# Patient Record
Sex: Male | Born: 1960
Health system: Southern US, Community
[De-identification: ages and names within clinical notes are randomized; demographics above are authoritative.]

## PROBLEM LIST (undated history)

## (undated) DIAGNOSIS — C61 Malignant neoplasm of prostate: Secondary | ICD-10-CM

## (undated) DIAGNOSIS — I10 Essential (primary) hypertension: Secondary | ICD-10-CM

## (undated) DIAGNOSIS — C7951 Secondary malignant neoplasm of bone: Secondary | ICD-10-CM

---

## 2010-06-27 DIAGNOSIS — C61 Malignant neoplasm of prostate: Secondary | ICD-10-CM

## 2010-06-27 HISTORY — DX: Malignant neoplasm of prostate: C61

## 2011-04-13 ENCOUNTER — Other Ambulatory Visit: Payer: Self-pay | Admitting: Urology

## 2011-04-13 ENCOUNTER — Other Ambulatory Visit (HOSPITAL_COMMUNITY): Payer: Self-pay | Admitting: Urology

## 2011-04-13 ENCOUNTER — Encounter (HOSPITAL_COMMUNITY): Payer: Managed Care, Other (non HMO)

## 2011-04-13 ENCOUNTER — Ambulatory Visit (HOSPITAL_COMMUNITY)
Admission: RE | Admit: 2011-04-13 | Discharge: 2011-04-13 | Disposition: A | Discharge status: Home / Self Care | Payer: Managed Care, Other (non HMO) | Source: Ambulatory Visit | Attending: Urology | Admitting: Urology

## 2011-04-13 DIAGNOSIS — Z01810 Encounter for preprocedural cardiovascular examination: Secondary | ICD-10-CM | POA: Insufficient documentation

## 2011-04-13 DIAGNOSIS — C61 Malignant neoplasm of prostate: Secondary | ICD-10-CM | POA: Insufficient documentation

## 2011-04-13 DIAGNOSIS — Z01812 Encounter for preprocedural laboratory examination: Secondary | ICD-10-CM | POA: Insufficient documentation

## 2011-04-13 DIAGNOSIS — Z01818 Encounter for other preprocedural examination: Secondary | ICD-10-CM

## 2011-04-13 LAB — CBC
HCT: 49 % (ref 39.0–52.0)
MCV: 84.6 fL (ref 78.0–100.0)
RBC: 5.79 MIL/uL (ref 4.22–5.81)
WBC: 8.8 10*3/uL (ref 4.0–10.5)

## 2011-04-13 LAB — BASIC METABOLIC PANEL
BUN: 14 mg/dL (ref 6–23)
CO2: 29 mEq/L (ref 19–32)
Chloride: 105 mEq/L (ref 96–112)
Creatinine, Ser: 1.17 mg/dL (ref 0.50–1.35)

## 2011-04-13 LAB — ABO/RH: ABO/RH(D): O NEG

## 2011-04-18 ENCOUNTER — Other Ambulatory Visit: Payer: Self-pay | Admitting: Urology

## 2011-04-18 ENCOUNTER — Inpatient Hospital Stay (HOSPITAL_COMMUNITY)
Admission: RE | Admit: 2011-04-18 | Discharge: 2011-04-19 | DRG: 708 | Disposition: A | Discharge status: Home / Self Care | Payer: Managed Care, Other (non HMO) | Source: Ambulatory Visit | Attending: Urology | Admitting: Urology

## 2011-04-18 DIAGNOSIS — G4733 Obstructive sleep apnea (adult) (pediatric): Secondary | ICD-10-CM | POA: Diagnosis present

## 2011-04-18 DIAGNOSIS — Z01812 Encounter for preprocedural laboratory examination: Secondary | ICD-10-CM

## 2011-04-18 DIAGNOSIS — C61 Malignant neoplasm of prostate: Principal | ICD-10-CM | POA: Diagnosis present

## 2011-04-18 DIAGNOSIS — I10 Essential (primary) hypertension: Secondary | ICD-10-CM | POA: Diagnosis present

## 2011-04-18 DIAGNOSIS — Z01810 Encounter for preprocedural cardiovascular examination: Secondary | ICD-10-CM

## 2011-04-18 DIAGNOSIS — Z01818 Encounter for other preprocedural examination: Secondary | ICD-10-CM

## 2011-04-18 LAB — BASIC METABOLIC PANEL
CO2: 22 mEq/L (ref 19–32)
Calcium: 8.6 mg/dL (ref 8.4–10.5)
Chloride: 102 mEq/L (ref 96–112)
Creatinine, Ser: 1.02 mg/dL (ref 0.50–1.35)
Glucose, Bld: 147 mg/dL — ABNORMAL HIGH (ref 70–99)

## 2011-04-18 LAB — TYPE AND SCREEN
ABO/RH(D): O NEG
Antibody Screen: NEGATIVE

## 2011-04-19 LAB — HEMOGLOBIN AND HEMATOCRIT, BLOOD: Hemoglobin: 13.6 g/dL (ref 13.0–17.0)

## 2011-04-21 NOTE — Op Note (Signed)
NAMEDONIEL, MAIELLO NO.:  [id number]  MEDICAL RECORD NO.:  [id number]  LOCATION:  1411                         FACILITY:  [facility]  PHYSICIAN:  Heloise Purpura, MD      DATE OF BIRTH:  1961/03/23  DATE OF PROCEDURE:  04/18/2011 DATE OF DISCHARGE:                              OPERATIVE REPORT   PREOPERATIVE DIAGNOSIS:  Clinically localized adenocarcinoma of the prostate (clinical stage T3a N0 M0).  POSTOPERATIVE DIAGNOSIS:  Clinically localized adenocarcinoma of the prostate (clinical stage T3a N0 M0).  PROCEDURE: 1. Robotic-assisted laparoscopic radical prostatectomy (non-nerve     sparing). 2. Bilateral robotic-assisted laparoscopic pelvic lymphadenectomy.  SURGEON:  Heloise Purpura, MD  ASSISTANT:  Natalia Leatherwood, MD  ANESTHESIA:  General.  COMPLICATIONS:  None.  ESTIMATED BLOOD LOSS:  250 cc.  INTRAVENOUS FLUIDS:  1200 cc of crystalloid.  SPECIMENS: 1. Prostate and seminal vesicles. 2. Right pelvic lymph nodes. 3. Left pelvic lymph nodes.  DISPOSITION OF SPECIMENS:  To Pathology.  DRAINS: 1. 20-French coude catheter. 2. #19 Blake pelvic drain.  INDICATION:  Mr. Frankson is a 50 year old gentleman who was found to have an abnormal digital rectal exam and elevated PSA.  He was diagnosed with locally advanced prostate cancer without evidence for distant metastasis.  After reviewing management options for treatment, he elected to proceed with primary surgical therapy understanding the high risk that he would require multimodality treatment.  The potential risks, complications, and alternative treatment options were discussed in detail and informed consent obtained.  DESCRIPTION OF PROCEDURE:  The patient was taken to the operating room and a general anesthetic was administered.  He was given preoperative antibiotics, placed in the dorsal lithotomy position, and prepped and draped in the usual sterile fashion.  Next, a preoperative time-out  was performed.  A Foley catheter was then inserted into the bladder and a site was selected just superior to the umbilicus for placement of the camera port.  This was placed using a standard open Hasson technique and his incision was made just superior to his known umbilical hernia.  A 12 mm port was placed and a pneumoperitoneum established.  With the 0- degree lens, the abdomen was inspected and there was no evidence of any intra-abdominal injuries or other abnormalities.  The remaining ports were then placed with 8 mm robotic ports placed in the left lower quadrant, far left lower quadrant, and right lower quadrant.  A 5 mm port was placed in the right upper quadrant and a 12 mm port was placed in the far right lateral abdominal wall.  All ports were placed under direct vision and without difficulty.  The surgical cart was then docked.  With the aid of the cautery scissors, the bladder was reflected posteriorly allowing entry into the space of Retzius and identification of the endopelvic fascia and prostate.  The endopelvic fascia was then incised from the apex back to the base of the prostate bilaterally and the underlying levator muscle fibers were swept laterally off the prostate, thereby isolating the dorsal vein.  The dorsal vein was then stapled and divided with a 45 mm Flex Echelon stapler.  The bladder neck was then identified  with the help of Foley catheter manipulation and was divided anteriorly, thereby exposing the catheter.  The catheter balloon was deflated and the catheter was brought into the operative field and used to retract the prostate anteriorly.  The posterior bladder neck was then divided and dissection proceeded between the bladder and prostate until the vasa deferentia and seminal vesicles were identified.  The vasa deferentia were then isolated, divided, and lifted anteriorly and the seminal vesicles were dissected down to their tips with care to control the  seminal vesicle arterial blood supply.  These structures were then also lifted anteriorly in the space between Denonvilliers fascia and the anterior rectum was developed with a combination of sharp and blunt dissection.  On the right side of the prostate, the prostate did appear to be densely adherent posteriorly, consistent with the patient's locally advanced disease.  This required extensive sharp dissection.  The vascular pedicles of the prostate were then ligated in a wide non-nerve sparing fashion.  Using Hem-o-lok clips.  Bipolar energy was also utilized to help control the vascular pedicles of the prostate.  Significant periprosthetic tissue was left on the prostate with the specimen.  The urethra was sharply transected and the prostate specimen was disarticulated.  The pelvis was then copiously irrigated and hemostasis was ensured.  With irrigation in the pelvis, air was injected into the rectal catheter, and there was no evidence for a rectal injury.  Attention then turned to the right pelvic sidewall.  The fibrofatty tissue extending from the genitofemoral nerve laterally to the confluence of the iliac vessels superiorly to Cooper ligament distally and to the hypogastric artery inferiorly and the bladder medially was dissected free from the pelvic sidewall with care to preserve all vital vascular structures and the obturator nerve.  Hem-o- lok clips were used for lymphostasis and hemostasis.  An identical procedure was performed on the contralateral side and both lymphatic packets were subsequently removed for permanent pathological analysis. Attention then turned to the urethral anastomosis.  A 2-0 Vicryl slipknot was placed between Denonvilliers fascia, the posterior bladder neck, and the posterior urethra to reapproximate these structures.  A double-armed 3-0 Monocryl suture was then used to perform a 360-degree running tension-free anastomosis between the bladder neck and  urethra. A new 20-French coude catheter was inserted into the bladder and irrigated.  There were no blood clots within the bladder and the anastomosis appeared to be watertight.  A #19 Blake drain was then brought through the left robotic port and positioned appropriately within the pelvis.  It was secured to the skin with a nylon suture.  The surgical cart was then undocked.  The right lateral 12 mm port site was then closed with a 0 Vicryl suture placed laparoscopically.  All remaining ports were then removed under direct vision.  The prostate specimen was removed intact within the Endopouch retrieval bag via the periumbilical port site.  This fascial opening was then carried down to the hernia defect in the umbilicus and the skin incision was slightly extended in a periumbilical fashion around the right side of the umbilicus.  The hernia defect was entered and the fascial edges of the hernia were then reapproximated utilizing figure-of-8 Novafil sutures as were the fascial edges of the original camera port incision.  This appeared to result in an excellent fascial closure of this defect.  All skin incisions were then injected with 0.25% Marcaine and reapproximated at the skin level with staples. Sterile dressings were applied.  The patient appeared  to tolerate the procedure well and without complications.  He was able to be extubated and transferred to the recovery unit in satisfactory condition.     Heloise Purpura, MD     LB/MEDQ  D:  04/18/2011  T:  04/18/2011  Job:  [id number]  Electronically Signed by Heloise Purpura MD on 04/21/2011 07:07:37 AM

## 2011-04-21 NOTE — Discharge Summary (Signed)
  NAMEBRISCOE, [name]:  [id number]  MEDICAL RECORD NO.:  [id number]  LOCATION:  1411                         FACILITY:  [facility]  PHYSICIAN:  Heloise Purpura, MD      DATE OF BIRTH:  Nov 12, 1960  DATE OF ADMISSION:  04/18/2011 DATE OF DISCHARGE:  04/19/2011                              DISCHARGE SUMMARY   REASON FOR ADMISSION:  Prostate cancer.  DISCHARGE DIAGNOSIS:  Prostate cancer.  HISTORY AND PHYSICAL:  For full details, please see admission history and physical.  Briefly, Mr. [name] is a 50 year old gentleman with locally advanced prostate cancer.  After discussing options for treatment, he elected to proceed with primary surgical therapy.  HOSPITAL COURSE:  On April 18, 2011, he underwent a robotic-assisted laparoscopic radical prostatectomy and extended pelvic lymphadenectomy. He tolerated this procedure well without complications. Postoperatively, he was able to be transferred to a regular hospital room following recovery from anesthesia.  He was able to begin ambulating in the night of surgery and remained hemodynamically stable. His hemoglobin on postoperative day #1 was 13.6.  He tolerated a clear liquid diet and was transitioned to oral pain medication.  He was able to ambulate without difficulty and maintained excellent urine output with minimal output from his pelvic drain, which was removed on postoperative day #1.  By the afternoon of postoperative day #1, he had met all discharge criteria.  DISPOSITION:  Home.  DISCHARGE MEDICATIONS:  He will resume all of his regular home medications excepting any aspirin, nonsteroidal anti-inflammatory drugs, or herbal supplements.  He was provided a prescription to take Vicodin as needed for pain and will use Colace as a stool softener.  He will begin ciprofloxacin 1 day prior to his return visit for Foley catheter removal.  DISCHARGE INSTRUCTIONS:  He was instructed to be ambulatory, but specifically  told to refrain from any heavy lifting, strenuous activity, or driving.  He was instructed on routine Foley catheter care.  FOLLOWUP:  He will follow up in 1 week for removal of this catheter and staples and to discuss his surgical pathology results in detail.     Heloise Purpura, MD    LB/MEDQ  D:  04/19/2011  T:  04/20/2011  Job:  [id number]  Electronically Signed by Heloise Purpura MD on 04/21/2011 07:08:03 AM

## 2012-08-05 ENCOUNTER — Emergency Department (HOSPITAL_COMMUNITY)
Admission: EM | Admit: 2012-08-05 | Discharge: 2012-08-05 | Disposition: A | Discharge status: Home / Self Care | Payer: Managed Care, Other (non HMO) | Attending: Emergency Medicine | Admitting: Emergency Medicine

## 2012-08-05 ENCOUNTER — Encounter (HOSPITAL_COMMUNITY): Payer: Self-pay | Admitting: Family Medicine

## 2012-08-05 DIAGNOSIS — Z8546 Personal history of malignant neoplasm of prostate: Secondary | ICD-10-CM | POA: Insufficient documentation

## 2012-08-05 DIAGNOSIS — I10 Essential (primary) hypertension: Secondary | ICD-10-CM | POA: Insufficient documentation

## 2012-08-05 DIAGNOSIS — M25539 Pain in unspecified wrist: Secondary | ICD-10-CM | POA: Insufficient documentation

## 2012-08-05 DIAGNOSIS — R209 Unspecified disturbances of skin sensation: Secondary | ICD-10-CM | POA: Insufficient documentation

## 2012-08-05 DIAGNOSIS — F411 Generalized anxiety disorder: Secondary | ICD-10-CM | POA: Insufficient documentation

## 2012-08-05 HISTORY — DX: Essential (primary) hypertension: I10

## 2012-08-05 LAB — POCT I-STAT, CHEM 8
BUN: 18 mg/dL (ref 6–23)
Calcium, Ion: 1.23 mmol/L (ref 1.12–1.23)
Chloride: 109 mEq/L (ref 96–112)
Glucose, Bld: 91 mg/dL (ref 70–99)
HCT: 43 % (ref 39.0–52.0)
Potassium: 4 mEq/L (ref 3.5–5.1)

## 2012-08-05 NOTE — ED Notes (Addendum)
Per EMS, pt was at work and had a sudden onset of sharp pain with numbness in his left arm from middle of bicep to forearm. sts lasted about 1 min. sts only anxious at the moment. BP was initially 190/120. Now 118/74. Denies chest pain, SOB, dizziness. Pt had 324 ASA and 1 nitro.

## 2012-08-05 NOTE — ED Provider Notes (Signed)
Medical screening examination/treatment/procedure(s) were performed by non-physician practitioner and as supervising physician I was immediately available for consultation/collaboration.  Francee Setzer, MD 08/05/12 1637 

## 2012-08-05 NOTE — ED Provider Notes (Signed)
History     CSN: [id number]  Arrival date & time 08/05/12  1255   First MD Initiated Contact with Patient 08/05/12 1317      Chief Complaint  Patient presents with  . Arm Pain  . Numbness    (Consider location/radiation/quality/duration/timing/severity/associated sxs/prior treatment) Patient is a 52 y.o. male presenting with arm pain.  Arm Pain   This patient is a 52 year old male with a history of prostate cancer and hypertension who presents today with arm pain and hand numbness.  The arm pain begin about a few hours ago when he was at work lifting a heavy bag of paint.  Then there was a sharp pain of the left anterior proximal 1/3 of the forearm.  Within one to two minutes the pain went away.  His co-worker told him that left arm pain was indicative of a stroke so the patient called the ambulance.  Once he got here he noticed that the tips of his finger and dorsal side of the hand on both sides were tingling.   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  He thinks that this may be caused by anxiety.  Denies dysarthria, dysphagia, aphasia, weakness on half of the body or diplopia.   Past Medical History  Diagnosis Date  . Hypertension   . Cancer     History reviewed. No pertinent past surgical history.  History reviewed. No pertinent family history.  History  Substance Use Topics  . Smoking status: Not on file  . Smokeless tobacco: Not on file  . Alcohol Use: Not on file      Review of Systems All other systems negative except as documented in the HPI. All pertinent positives and negatives as reviewed in the HPI.  Allergies  Review of patient's allergies indicates not on file.  Home Medications  No current outpatient prescriptions on file.  BP 122/79  Pulse 75  Temp(Src) 98.4 F (36.9 C) (Oral)  Resp 18  SpO2 95%  Physical Exam  Nursing note and vitals reviewed. Constitutional: He is oriented to person, place, and  time. He appears well-developed and well-nourished.  HENT:  Head: Normocephalic and atraumatic.  Mouth/Throat: Oropharynx is clear and moist.  Eyes: EOM are normal. Pupils are equal, round, and reactive to light.  Neck: Normal range of motion. Neck supple.  Cardiovascular: Normal rate, regular rhythm and normal heart sounds.  Exam reveals no gallop and no friction rub.   No murmur heard. Pulmonary/Chest: Effort normal and breath sounds normal. No respiratory distress.  Musculoskeletal: Normal range of motion.  Neurological: He is alert and oriented to person, place, and time. He has normal strength. No cranial nerve deficit or sensory deficit.  Skin: Skin is warm and dry. No rash noted.  Psychiatric: He has a normal mood and affect.    ED Course  Procedures (including critical care time)  The patient had forearm pain when lifting a heavy object and for about a minute after that time. The patient will be asked to recheck with his PCP. Told to return here as needed. This pain was most likely from straining with a heavy object.      MDM    Date: 08/05/2012  Rate: 75  Rhythm: normal sinus rhythm  QRS Axis: normal  Intervals: normal  ST/T Wave abnormalities: normal  Conduction Disutrbances:none  Narrative Interpretation:   Old EKG Reviewed: none available  Carlyle Dolly, PA-C 08/05/12 1537  Carlyle Dolly, PA-C 08/05/12 1537

## 2012-09-04 ENCOUNTER — Other Ambulatory Visit: Payer: Self-pay | Admitting: Emergency Medicine

## 2012-09-04 ENCOUNTER — Ambulatory Visit (INDEPENDENT_AMBULATORY_CARE_PROVIDER_SITE_OTHER): Payer: Managed Care, Other (non HMO)

## 2012-09-04 DIAGNOSIS — Z021 Encounter for pre-employment examination: Secondary | ICD-10-CM

## 2012-09-04 DIAGNOSIS — Z0289 Encounter for other administrative examinations: Secondary | ICD-10-CM

## 2012-12-27 ENCOUNTER — Ambulatory Visit: Payer: Managed Care, Other (non HMO) | Admitting: Internal Medicine

## 2013-02-21 ENCOUNTER — Other Ambulatory Visit (HOSPITAL_COMMUNITY): Payer: Self-pay | Admitting: Urology

## 2013-02-21 DIAGNOSIS — C61 Malignant neoplasm of prostate: Secondary | ICD-10-CM

## 2013-03-27 ENCOUNTER — Encounter (HOSPITAL_COMMUNITY): Payer: Self-pay

## 2013-03-27 ENCOUNTER — Encounter (HOSPITAL_COMMUNITY)
Admission: RE | Admit: 2013-03-27 | Discharge: 2013-03-27 | Disposition: A | Discharge status: Home / Self Care | Payer: Managed Care, Other (non HMO) | Source: Ambulatory Visit | Attending: Urology | Admitting: Urology

## 2013-03-27 DIAGNOSIS — C61 Malignant neoplasm of prostate: Secondary | ICD-10-CM | POA: Insufficient documentation

## 2013-03-27 MED ORDER — TECHNETIUM TC 99M MEDRONATE IV KIT
25.0000 | PACK | Freq: Once | INTRAVENOUS | Status: AC | PRN
  Administered 2013-03-27: 25 via INTRAVENOUS

## 2015-05-01 ENCOUNTER — Other Ambulatory Visit (HOSPITAL_COMMUNITY): Payer: Self-pay | Admitting: Urology

## 2015-05-01 DIAGNOSIS — C61 Malignant neoplasm of prostate: Secondary | ICD-10-CM

## 2015-05-15 ENCOUNTER — Encounter (HOSPITAL_COMMUNITY)
Admission: RE | Admit: 2015-05-15 | Discharge: 2015-05-15 | Disposition: A | Discharge status: Home / Self Care | Payer: Managed Care, Other (non HMO) | Source: Ambulatory Visit | Attending: Urology | Admitting: Urology

## 2015-05-15 DIAGNOSIS — C61 Malignant neoplasm of prostate: Secondary | ICD-10-CM

## 2015-05-15 MED ORDER — TECHNETIUM TC 99M MEDRONATE IV KIT: 24.2000 | PACK | Freq: Once | INTRAVENOUS | Status: DC | PRN

## 2015-05-20 ENCOUNTER — Telehealth: Payer: Self-pay | Admitting: Oncology

## 2015-05-20 NOTE — Telephone Encounter (Signed)
Spoke with pt's wife regarding new pt referral and wife confirmed appt.

## 2015-06-02 ENCOUNTER — Ambulatory Visit (HOSPITAL_BASED_OUTPATIENT_CLINIC_OR_DEPARTMENT_OTHER): Payer: Managed Care, Other (non HMO) | Admitting: Oncology

## 2015-06-02 ENCOUNTER — Telehealth: Payer: Self-pay | Admitting: Oncology

## 2015-06-02 ENCOUNTER — Encounter: Payer: Self-pay | Admitting: Oncology

## 2015-06-02 VITALS — BP 114/71 | HR 66 | Temp 98.0°F | Resp 20 | Ht 70.0 in | Wt 238.6 lb

## 2015-06-02 DIAGNOSIS — E291 Testicular hypofunction: Secondary | ICD-10-CM

## 2015-06-02 DIAGNOSIS — C7951 Secondary malignant neoplasm of bone: Secondary | ICD-10-CM | POA: Diagnosis not present

## 2015-06-02 DIAGNOSIS — C61 Malignant neoplasm of prostate: Secondary | ICD-10-CM | POA: Diagnosis not present

## 2015-06-02 MED ORDER — ENZALUTAMIDE 40 MG PO CAPS: 160.0000 mg | ORAL_CAPSULE | Freq: Every day | ORAL | Status: DC

## 2015-06-02 NOTE — Progress Notes (Signed)
Please see consult note.  

## 2015-06-02 NOTE — Telephone Encounter (Signed)
per pof to sch pt appt-gave pt copy of avs °

## 2015-06-02 NOTE — Consult Note (Signed)
Reason for Referral: Prostate cancer.   HPI: 54 year old gentleman currently of [place] where he lived the majority of his life. He is a rather healthy gentleman with history of obesity and hypertension and was diagnosed with prostate cancer in 2012. At that time he was complaining of erectile dysfunction and his PSA was noted to be 16.6. He underwent a radical prostatectomy on 04/18/2011 under the care of Dr. Alinda Money. The final pathology showed to have prostate adenocarcinoma with a Gleason score of 4+5 = 9 with tumor involving both lobes. Extraprostatic extension was noted with bilateral regional lymph node were involved with malignancy. He had 5 of 6 lymph nodes noted in the right regional lymph node basin. He also had 4 out of 5 lymph nodes noted on the left. The final pathological staging was T3b N1 with 9 positive lymph nodes out of 11 examined. He continued to have persistent PSA and was treated with androgen deprivation. His PSA became undetectable until August 2014. He had a rise in his PSA was up to 1.01 in August 2014. The rise continued to be rather slow up to 1.93 in October 14, 1.55 in May 2015. In September 2015 was 1.29 and Casodex was added around that time. In June 2016 was 2.0 and most recently his PSA was up to 5.6. Staging workup including a bone scan done on 05/15/2015 which showed a focus of increased uptake in the posterior parietal occipital region of the skull. There is also area of uptake likely degenerative in nature. CT scan of the abdomen and pelvis done on 05/15/2015 shows sclerotic osseous metastasis noted in multiple locations. These include the spine, pelvis, hips and sternum as well as the ribs. Based on these findings as asked to comment about further therapy. Clinically, he is asymptomatic at this time. He continued to work full time without any major decline. He does report intermittent back pain that have been chronic and arthritic in nature. Has not reported  any genitourinary complaints of frequency urgency or hematuria. His performance status activity level remain excellent.  He does not report any headaches, blurry vision, syncope or seizures. Does not report any fevers, chills, sweats or weight loss. Does not report any chest pain, palpitation, orthopnea or leg edema. Does not report any cough, hemoptysis or wheezing. Does not report any nausea, vomiting, abdominal pain. Does not report any hematochezia, melena or early satiety. Does not report any frequency urgency or hesitancy. Does not report any skeletal complaints. Remaining review of systems unremarkable.   Past Medical History  Diagnosis Date  . Hypertension   . Cancer   :  No past surgical history on file.:   Current outpatient prescriptions:  .  amLODipine (NORVASC) 10 MG tablet, Take 10 mg by mouth daily., Disp: , Rfl:  .  amLODipine-olmesartan (AZOR) 5-40 MG per tablet, Take 1 tablet by mouth daily., Disp: , Rfl:  .  calcium & magnesium carbonates (MYLANTA) 311-232 MG tablet, Take 1,200 tablets by mouth daily., Disp: , Rfl:  .  calcium-vitamin D (CALCIUM 500+D) 500-200 MG-UNIT per tablet, Take 2 tablets by mouth daily., Disp: , Rfl:  .  naproxen sodium (ANAPROX) 220 MG tablet, Take 440 mg by mouth 2 (two) times daily as needed. For pain, Disp: , Rfl:  .  enzalutamide (XTANDI) 40 MG capsule, Take 4 capsules (160 mg total) by mouth daily., Disp: 120 capsule, Rfl: 0:  Allergies  Allergen Reactions  . Lanolin   :  No family history on file.:  Social History   Social History  . Marital Status: Married    Spouse Name: N/A  . Number of Children: N/A  . Years of Education: N/A   Occupational History  . Not on file.   Social History Main Topics  . Smoking status: Not on file  . Smokeless tobacco: Not on file  . Alcohol Use: Not on file  . Drug Use: Not on file  . Sexual Activity: Not on file   Other Topics Concern  . Not on file   Social History Narrative  . No  narrative on file  :  Pertinent items are noted in HPI.  Exam: ECOG 0 Blood pressure 114/71, pulse 66, temperature 98 F (36.7 C), temperature source Oral, resp. rate 20, height 5\' 10"  (1.778 m), weight 238 lb 9.6 oz (108.228 kg), SpO2 100 %. General appearance: alert and cooperative. No active distress. Head: Normocephalic, without obvious abnormality. Throat: lips, mucosa, and tongue normal; teeth and gums normal Neck: no adenopathy Back: negative Resp: clear to auscultation bilaterally Chest wall: no tenderness Cardio: regular rate and rhythm, S1, S2 normal, no murmur, click, rub or gallop GI: soft, non-tender; bowel sounds normal; no masses,  no organomegaly Extremities: extremities normal, atraumatic, no cyanosis or edema Pulses: 2+ and symmetric Skin: Skin color, texture, turgor normal. No rashes or lesions Lymph nodes: Cervical, supraclavicular, and axillary nodes normal.  CBC    Component Value Date/Time   WBC 8.8 04/13/2011 0945   RBC 5.79 04/13/2011 0945   HGB 14.6 08/05/2012 1524   HCT 43.0 08/05/2012 1524   PLT 269 04/13/2011 0945   MCV 84.6 04/13/2011 0945   MCH 28.7 04/13/2011 0945   MCHC 33.9 04/13/2011 0945   RDW 13.8 04/13/2011 0945      Chemistry      Component Value Date/Time   NA 142 08/05/2012 1524   K 4.0 08/05/2012 1524   CL 109 08/05/2012 1524   CO2 22 04/18/2011 1520   BUN 18 08/05/2012 1524   CREATININE 1.20 08/05/2012 1524      Component Value Date/Time   CALCIUM 8.6 04/18/2011 1520         Nm Bone Scan Whole Body  05/15/2015  CLINICAL DATA:  Prostate malignancy, no current symptoms EXAM: NUCLEAR MEDICINE WHOLE BODY BONE SCAN TECHNIQUE: Whole body anterior and posterior images were obtained approximately 3 hours after intravenous injection of radiopharmaceutical. RADIOPHARMACEUTICALS:  24.2 mCi Technetium-43m MDP IV COMPARISON:  Nuclear bone scan of March 27, 2013 FINDINGS: There is adequate uptake of the radiopharmaceutical by the  skeleton there is adequate soft tissue clearance and renal activity. There is a new focus of increased uptake in the posterior parietal bone in the midline. There is minimal uptake in the costo vertebral junctions bilaterally compatible with degenerative change. There is a new focus of mildly increased uptake in the posterior lateral aspect of the left eighth rib and in the anterior aspect of the right fifth rib. There is a focus of mildly increased uptake within the body of approximately L3 that is likely degenerative. Upper extremities: There is mildly increased up to take within the sternoclavicular joints and manubrium which is slightly more conspicuous than on previous studies. Given its symmetry this is likely due to degenerative change. There is mild stable increased uptake about both shoulders. There is a focus of increased uptake in the midshaft of the right humerus. This relatively low level and there may been some patient motion which can pounds the findings in other  views. Pelvis and lower extremities: Uptake within the pelvis is unremarkable. There is mildly increased uptake about the right knee, proximal tibia, and in both feet compatible with degenerative change. IMPRESSION: 1. Focus of new increased uptake in the posterior parietal-occipital region in the midline. A skull series is recommended. 2. Areas of increased uptake elsewhere as described are most likely degenerative or post traumatic. A bilateral rib series would be useful to evaluate the anterior right fifth rib as well as the posterior lateral left eighth rib. A lumbar spine AP and lateral series is recommended to evaluate L3. Electronically Signed   By: David  Martinique M.D.   On: 05/15/2015 14:34    Assessment and Plan:   54 year old gentleman with the following issues:  1. 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 His initial diagnosis was in 2012 where he had a Gleason  score of 4+5 = 9 and a PSA of 16.6. He underwent a robotic prostatectomy and androgen deprivation. After a PSA nadir he had been on a slow rise for the last 2 years. His most recent PSA was 5.6 in November 2016 with possible bony metastasis detected on his most recent imaging studies.  The natural course of this disease was discussed with the patient and his wife. He understands any therapy moving forward would be palliative rather than curative. His options would include second line hormone therapy with Nicki Reaper, ketoconazole and prednisone among other hormonal agents. Immunotherapy such as Provenge would be reasonable as well. Systemic chemotherapy as well as enrollment in clinical trials are also options.  The rationale and the risks and benefits of all these modalities were reviewed today and he would be reasonable to consider second line hormone therapy given the slow rise in his PSA but the aggressive nature of his malignancy. His quality of life will be changed dramatically with systemic chemotherapy and I will be deferred to a later date.  The risks and benefits of Xtandi were reviewed today. Complications include fatigue, tiredness, lower from edema, hypertension and rarely seizures have reviewed and he is agreeable to proceed.  The cost associated with this medication was also reviewed and will make sure that no financial toxicity will be incurred by prescribing this medication.  The benefit from this medication would include a reduction in his PSA as well decrease in his potential bony metastasis.  2. Hormonal therapy: I have recommended androgen deprivation to be continued indefinitely.  3. Bone directed therapy: It will be reasonable to consider Xgeva in the future if his clear-cut bone metastasis have been elucidated.  4. Prognosis: He understands the goal of therapy is palliative but his prognosis continues to be reasonable at this time given his young age, excellent performance  status is reasonable to think that his overall survival should exceed 24 months.  5. Follow-up: Will be in one month to assess his response to this therapy.

## 2015-06-02 NOTE — Progress Notes (Signed)
Sent pa form for xtandi to [facility]; they have up to 72-120 hours to make a decision.

## 2015-06-02 NOTE — Progress Notes (Signed)
Script for xtandi faxed to [facility]

## 2015-06-09 ENCOUNTER — Encounter: Payer: Self-pay | Admitting: Pharmacist

## 2015-06-09 DIAGNOSIS — Z5111 Encounter for antineoplastic chemotherapy: Secondary | ICD-10-CM

## 2015-06-09 NOTE — Progress Notes (Signed)
Oral Chemotherapy Pharmacist Encounter   I spoke with patient for overview of new oral chemotherapy medication: Xtandi. Pt is doing well. The prescription has been sent to the [facility] for benefit analysis and approval. Copay is $20. Patient will pick up tomorrow 12/14  Counseled patient on administration, dosing, side effects, safe handling, and monitoring. Side effects include but not limited to: Fatigue, back and muscle pain, hypertension, dizziness, and swelling.  Mr. Decou voiced understanding and appreciation.   All questions answered.  Will follow up in 2 weeks for adherence and toxicity management.   Thank you,  Montel Clock, PharmD, [facility]

## 2015-06-16 ENCOUNTER — Telehealth: Payer: Self-pay | Admitting: Oncology

## 2015-06-16 NOTE — Telephone Encounter (Signed)
Due to call moved 1/4 appointments to 1/6. Left message for patient and mailed schedule.

## 2015-07-01 ENCOUNTER — Other Ambulatory Visit: Payer: Managed Care, Other (non HMO)

## 2015-07-01 ENCOUNTER — Ambulatory Visit: Payer: Managed Care, Other (non HMO) | Admitting: Oncology

## 2015-07-03 ENCOUNTER — Telehealth: Payer: Self-pay | Admitting: Oncology

## 2015-07-03 ENCOUNTER — Other Ambulatory Visit (HOSPITAL_BASED_OUTPATIENT_CLINIC_OR_DEPARTMENT_OTHER): Payer: Managed Care, Other (non HMO)

## 2015-07-03 ENCOUNTER — Ambulatory Visit (HOSPITAL_BASED_OUTPATIENT_CLINIC_OR_DEPARTMENT_OTHER): Payer: Managed Care, Other (non HMO) | Admitting: Oncology

## 2015-07-03 VITALS — BP 130/71 | HR 78 | Temp 98.2°F | Resp 18 | Ht 70.0 in | Wt 241.9 lb

## 2015-07-03 DIAGNOSIS — C61 Malignant neoplasm of prostate: Secondary | ICD-10-CM | POA: Diagnosis not present

## 2015-07-03 DIAGNOSIS — C775 Secondary and unspecified malignant neoplasm of intrapelvic lymph nodes: Secondary | ICD-10-CM | POA: Diagnosis not present

## 2015-07-03 DIAGNOSIS — E291 Testicular hypofunction: Secondary | ICD-10-CM

## 2015-07-03 DIAGNOSIS — C7951 Secondary malignant neoplasm of bone: Secondary | ICD-10-CM

## 2015-07-03 LAB — CBC WITH DIFFERENTIAL/PLATELET
BASO%: 0.3 % (ref 0.0–2.0)
BASOS ABS: 0 10*3/uL (ref 0.0–0.1)
EOS%: 1.4 % (ref 0.0–7.0)
Eosinophils Absolute: 0.1 10*3/uL (ref 0.0–0.5)
HEMATOCRIT: 38.8 % (ref 38.4–49.9)
HGB: 12.7 g/dL — ABNORMAL LOW (ref 13.0–17.1)
LYMPH%: 12 % — AB (ref 14.0–49.0)
MCH: 25.6 pg — AB (ref 27.2–33.4)
MCHC: 32.7 g/dL (ref 32.0–36.0)
MCV: 78.2 fL — ABNORMAL LOW (ref 79.3–98.0)
MONO#: 0.7 10*3/uL (ref 0.1–0.9)
MONO%: 7 % (ref 0.0–14.0)
NEUT#: 8.1 10*3/uL — ABNORMAL HIGH (ref 1.5–6.5)
NEUT%: 79.3 % — AB (ref 39.0–75.0)
Platelets: 338 10*3/uL (ref 140–400)
RBC: 4.96 10*6/uL (ref 4.20–5.82)
RDW: 15.7 % — ABNORMAL HIGH (ref 11.0–14.6)
WBC: 10.2 10*3/uL (ref 4.0–10.3)
lymph#: 1.2 10*3/uL (ref 0.9–3.3)

## 2015-07-03 LAB — COMPREHENSIVE METABOLIC PANEL
ALT: 24 U/L (ref 0–55)
AST: 17 U/L (ref 5–34)
Albumin: 3.6 g/dL (ref 3.5–5.0)
Alkaline Phosphatase: 103 U/L (ref 40–150)
Anion Gap: 10 mEq/L (ref 3–11)
BUN: 22.5 mg/dL (ref 7.0–26.0)
CALCIUM: 9.5 mg/dL (ref 8.4–10.4)
CHLORIDE: 103 meq/L (ref 98–109)
CO2: 24 meq/L (ref 22–29)
Creatinine: 1.5 mg/dL — ABNORMAL HIGH (ref 0.7–1.3)
EGFR: 53 mL/min/{1.73_m2} — ABNORMAL LOW (ref >90)
Glucose: 112 mg/dl (ref 70–140)
POTASSIUM: 3.9 meq/L (ref 3.5–5.1)
Sodium: 137 mEq/L (ref 136–145)
Total Bilirubin: 0.84 mg/dL (ref 0.20–1.20)
Total Protein: 7.7 g/dL (ref 6.4–8.3)

## 2015-07-03 NOTE — Telephone Encounter (Signed)
Gave patient avs report and appointments for February.  °

## 2015-07-03 NOTE — Progress Notes (Signed)
Hematology and Oncology Follow Up Visit  [name] [id number] 20-Apr-1961 55 y.o. 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 His initial diagnosis was in 2012 where he had a Gleason score of 4+5 = 9 and a PSA of 16.6.   Prior Therapy:  He underwent a radical prostatectomy on 04/18/2011 under the care of Dr. Alinda Money. The final pathology showed to have prostate adenocarcinoma with a Gleason score of 4+5 = 9 with tumor involving both lobes. Extraprostatic extension was noted with bilateral regional lymph node were involved with malignancy. He had 5 of 6 lymph nodes noted in the right regional lymph node basin. He also had 4 out of 5 lymph nodes noted on the left. The final pathological staging was T3b N1 with 9 positive lymph nodes out of 11 examined.  He continued to have persistent PSA and was treated with androgen deprivation. His PSA became undetectable until August 2014. In September 2015 PSA increased up to 1.29 and Casodex was added around that time.  In June 2016 PSA went up to 2.0 and subsequently to 5.6 in November 2016. Staging workup including a bone scan done on 05/15/2015 which showed a focus of increased uptake in the posterior parietal occipital region of the skull. There is also area of uptake likely degenerative in nature. CT scan of the abdomen and pelvis done on 05/15/2015 shows sclerotic osseous metastasis noted in multiple locations.  Current therapy:  Androgen deprivation under the care of Dr. Alinda Money. Xtandi started on 06/10/2015.  Interim History: [name] presents today for a follow-up visit. He is a pleasant gentleman with the above history I saw in consultation on 06/02/2015. Since the last visit, he started xtandi and have tolerated it well. He has not reported any complications related to it. He denied any abdominal pain, nausea or less of appetite. He has not  reported any neurological issues or seizures. He continues to work full-time without any decline in his performance status her energy level.   He does not report any headaches, blurry vision, syncope or seizures. Does not report any fevers, chills, sweats or weight loss. Does not report any chest pain, palpitation, orthopnea or leg edema. Does not report any cough, hemoptysis or wheezing. Does not report any nausea, vomiting, abdominal pain. Does not report any hematochezia, melena or early satiety. Does not report any frequency urgency or hesitancy. Does not report any skeletal complaints. Remaining review of systems unremarkable.  Medications: I have reviewed the patient's current medications.  Current Outpatient Prescriptions  Medication Sig Dispense Refill  . amLODipine (NORVASC) 10 MG tablet Take 10 mg by mouth daily.    Marland Kitchen amLODipine-olmesartan (AZOR) 5-40 MG per tablet Take 1 tablet by mouth daily.    . calcium & magnesium carbonates (MYLANTA) [id number] MG tablet Take 1,200 tablets by mouth daily.    . calcium-vitamin D (CALCIUM 500+D) 500-200 MG-UNIT per tablet Take 2 tablets by mouth daily.    . enzalutamide (XTANDI) 40 MG capsule Take 4 capsules (160 mg total) by mouth daily. 120 capsule 0  . naproxen sodium (ANAPROX) 220 MG tablet Take 440 mg by mouth 2 (two) times daily as needed. For pain     No current facility-administered medications for this visit.     Allergies:  Allergies  Allergen Reactions  . Lanolin     Past Medical History, Surgical history, Social history, and Family History were reviewed and  updated.   Physical Exam: Blood pressure 130/71, pulse 78, temperature 98.2 F (36.8 C), temperature source Oral, resp. rate 18, height 5\' 10"  (1.778 m), weight 241 lb 14.4 oz (109.725 kg), SpO2 100 %. ECOG: 0 General appearance: alert and cooperative Head: Normocephalic, without obvious abnormality Neck: no adenopathy Lymph nodes: Cervical, supraclavicular, and axillary  nodes normal. Heart:regular rate and rhythm, S1, S2 normal, no murmur, click, rub or gallop Lung:chest clear, no wheezing, rales, normal symmetric air entry Abdomin: soft, non-tender, without masses or organomegaly EXT:no erythema, induration, or nodules   Lab Results: Lab Results  Component Value Date   WBC 10.2 07/03/2015   HGB 12.7* 07/03/2015   HCT 38.8 07/03/2015   MCV 78.2* 07/03/2015   PLT 338 07/03/2015     Chemistry      Component Value Date/Time   NA 137 07/03/2015 1239   NA 142 08/05/2012 1524   K 3.9 07/03/2015 1239   K 4.0 08/05/2012 1524   CL 109 08/05/2012 1524   CO2 24 07/03/2015 1239   CO2 22 04/18/2011 1520   BUN 22.5 07/03/2015 1239   BUN 18 08/05/2012 1524   CREATININE 1.5* 07/03/2015 1239   CREATININE 1.20 08/05/2012 1524      Component Value Date/Time   CALCIUM 9.5 07/03/2015 1239   CALCIUM 8.6 04/18/2011 1520   ALKPHOS 103 07/03/2015 1239   AST 17 07/03/2015 1239   ALT 24 07/03/2015 1239   BILITOT 0.84 07/03/2015 1239        Impression and Plan:  55 year old gentleman with the following issues:  1. 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 His initial diagnosis was in 2012 where he had a Gleason score of 4+5 = 9 and a PSA of 16.6. He underwent a robotic prostatectomy and androgen deprivation. After a PSA nadir he had been on a slow rise and his most recent PSA was 5.6 in November 2016 with possible bony metastasis detected on his most recent imaging studies.  He started xtandi in December 2016 and have tolerated it well. He has no difficulties obtaining this medication and did not report any new side effects. His PSA is currently pending but the plan is to continue on the same dose and schedule. We'll determine the efficacy of this medication by his future PSAs.  2. Hormonal therapy: I have recommended androgen deprivation to be continued indefinitely.  3. Bone directed therapy: He  is a candidate for bone directed therapy which will be considered in future visits.  4. Prognosis: He understands the goal of therapy is palliative but is a good candidate for aggressive therapy given his excellent performance status.  5. Follow-up: Will be in one month to assess his response to this therapy.   Zola Button, MD 1/6/20171:27 PM

## 2015-07-04 LAB — PSA: PSA: 2.4 ng/mL (ref <=4.00)

## 2015-07-06 ENCOUNTER — Other Ambulatory Visit: Payer: Self-pay | Admitting: *Deleted

## 2015-07-06 ENCOUNTER — Telehealth: Payer: Self-pay | Admitting: *Deleted

## 2015-07-06 DIAGNOSIS — Z5111 Encounter for antineoplastic chemotherapy: Secondary | ICD-10-CM

## 2015-07-06 MED ORDER — ENZALUTAMIDE 40 MG PO CAPS: 160.0000 mg | ORAL_CAPSULE | Freq: Every day | ORAL | Status: DC

## 2015-07-06 MED FILL — *XTANDI 40MG CAPSULE: 40 | 30 days supply | Qty: 120 | Fill #0

## 2015-07-06 NOTE — Telephone Encounter (Signed)
As noted below by Dr. Alen Blew, I informed patient's wife of his PSA level.

## 2015-07-06 NOTE — Telephone Encounter (Signed)
-----   Message from [name], MD sent at 07/06/2015  9:00 AM EST ----- Please call his PSA. It's coming down.

## 2015-08-06 ENCOUNTER — Other Ambulatory Visit: Payer: Self-pay | Admitting: *Deleted

## 2015-08-06 DIAGNOSIS — Z5111 Encounter for antineoplastic chemotherapy: Secondary | ICD-10-CM

## 2015-08-06 MED ORDER — ENZALUTAMIDE 40 MG PO CAPS: 160.0000 mg | ORAL_CAPSULE | Freq: Every day | ORAL | Status: DC

## 2015-08-06 MED FILL — *XTANDI 40MG CAPSULE: 40 | 30 days supply | Qty: 120 | Fill #0

## 2015-08-11 ENCOUNTER — Ambulatory Visit (HOSPITAL_BASED_OUTPATIENT_CLINIC_OR_DEPARTMENT_OTHER): Payer: Managed Care, Other (non HMO) | Admitting: Oncology

## 2015-08-11 ENCOUNTER — Telehealth: Payer: Self-pay | Admitting: Oncology

## 2015-08-11 ENCOUNTER — Other Ambulatory Visit (HOSPITAL_BASED_OUTPATIENT_CLINIC_OR_DEPARTMENT_OTHER): Payer: Managed Care, Other (non HMO)

## 2015-08-11 VITALS — BP 113/66 | HR 72 | Temp 98.1°F | Resp 18 | Ht 70.0 in | Wt 242.2 lb

## 2015-08-11 DIAGNOSIS — E291 Testicular hypofunction: Secondary | ICD-10-CM

## 2015-08-11 DIAGNOSIS — C775 Secondary and unspecified malignant neoplasm of intrapelvic lymph nodes: Secondary | ICD-10-CM

## 2015-08-11 DIAGNOSIS — C61 Malignant neoplasm of prostate: Secondary | ICD-10-CM | POA: Diagnosis not present

## 2015-08-11 DIAGNOSIS — C7951 Secondary malignant neoplasm of bone: Secondary | ICD-10-CM | POA: Diagnosis not present

## 2015-08-11 LAB — COMPREHENSIVE METABOLIC PANEL
ALBUMIN: 3.5 g/dL (ref 3.5–5.0)
ALK PHOS: 102 U/L (ref 40–150)
ALT: 17 U/L (ref 0–55)
ANION GAP: 10 meq/L (ref 3–11)
AST: 13 U/L (ref 5–34)
BILIRUBIN TOTAL: 0.53 mg/dL (ref 0.20–1.20)
BUN: 24.4 mg/dL (ref 7.0–26.0)
CO2: 24 meq/L (ref 22–29)
Calcium: 9 mg/dL (ref 8.4–10.4)
Chloride: 108 mEq/L (ref 98–109)
Creatinine: 1.6 mg/dL — ABNORMAL HIGH (ref 0.7–1.3)
EGFR: 48 mL/min/{1.73_m2} — AB (ref >90)
GLUCOSE: 106 mg/dL (ref 70–140)
POTASSIUM: 3.6 meq/L (ref 3.5–5.1)
SODIUM: 142 meq/L (ref 136–145)
Total Protein: 7.4 g/dL (ref 6.4–8.3)

## 2015-08-11 LAB — CBC WITH DIFFERENTIAL/PLATELET
BASO%: 1.1 % (ref 0.0–2.0)
Basophils Absolute: 0.1 10*3/uL (ref 0.0–0.1)
EOS%: 3.1 % (ref 0.0–7.0)
Eosinophils Absolute: 0.2 10*3/uL (ref 0.0–0.5)
HCT: 41.5 % (ref 38.4–49.9)
HGB: 13.4 g/dL (ref 13.0–17.1)
LYMPH%: 18 % (ref 14.0–49.0)
MCH: 25.5 pg — AB (ref 27.2–33.4)
MCHC: 32.3 g/dL (ref 32.0–36.0)
MCV: 79.1 fL — AB (ref 79.3–98.0)
MONO#: 0.6 10*3/uL (ref 0.1–0.9)
MONO%: 7.8 % (ref 0.0–14.0)
NEUT#: 5.1 10*3/uL (ref 1.5–6.5)
NEUT%: 70 % (ref 39.0–75.0)
PLATELETS: 334 10*3/uL (ref 140–400)
RBC: 5.24 10*6/uL (ref 4.20–5.82)
RDW: 16.8 % — AB (ref 11.0–14.6)
WBC: 7.3 10*3/uL (ref 4.0–10.3)
lymph#: 1.3 10*3/uL (ref 0.9–3.3)

## 2015-08-11 NOTE — Telephone Encounter (Signed)
per pof to sch pt appt-gave pt copy of avs °

## 2015-08-11 NOTE — Progress Notes (Signed)
Hematology and Oncology Follow Up Visit  [name] [id number] 1960-12-09 55 y.o. 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 His initial diagnosis was in 2012 where he had a Gleason score of 4+5 = 9 and a PSA of 16.6.   Prior Therapy:  He underwent a radical prostatectomy on 04/18/2011 under the care of Dr. Alinda Money. The final pathology showed to have prostate adenocarcinoma with a Gleason score of 4+5 = 9 with tumor involving both lobes. Extraprostatic extension was noted with bilateral regional lymph node were involved with malignancy. He had 5 of 6 lymph nodes noted in the right regional lymph node basin. He also had 4 out of 5 lymph nodes noted on the left. The final pathological staging was T3b N1 with 9 positive lymph nodes out of 11 examined.  He continued to have persistent PSA and was treated with androgen deprivation. His PSA became undetectable until August 2014. In September 2015 PSA increased up to 1.29 and Casodex was added around that time.  In June 2016 PSA went up to 2.0 and subsequently to 5.6 in November 2016. Staging workup including a bone scan done on 05/15/2015 which showed a focus of increased uptake in the posterior parietal occipital region of the skull. There is also area of uptake likely degenerative in nature. CT scan of the abdomen and pelvis done on 05/15/2015 shows sclerotic osseous metastasis noted in multiple locations.  Current therapy:  Androgen deprivation under the care of Dr. Alinda Money. Xtandi started on 06/10/2015.  Interim History: [name] presents today for a follow-up visit. Since the last visit, he reports no recent complaints or illnesses. He continues on Xtandi and have tolerated it well. He has not reported any complications related to it. He does report mild fatigue which is not interfering with his ability to work full time. He denied any  abdominal pain, nausea or less of appetite. He has not reported any neurological issues or seizures.   He does not report any headaches, blurry vision, syncope or seizures. Does not report any fevers, chills, sweats or weight loss. Does not report any chest pain, palpitation, orthopnea or leg edema. Does not report any cough, hemoptysis or wheezing. Does not report any nausea, vomiting, abdominal pain. Does not report any hematochezia, melena or early satiety. Does not report any frequency urgency or hesitancy. Does not report any skeletal complaints. Remaining review of systems unremarkable.  Medications: I have reviewed the patient's current medications.  Current Outpatient Prescriptions  Medication Sig Dispense Refill  . amLODipine (NORVASC) 10 MG tablet Take 10 mg by mouth daily.    Marland Kitchen atorvastatin (LIPITOR) 10 MG tablet Take 10 mg by mouth daily.    . calcium-vitamin D (CALCIUM 500+D) 500-200 MG-UNIT per tablet Take 2 tablets by mouth daily.    . enzalutamide (XTANDI) 40 MG capsule Take 4 capsules (160 mg total) by mouth daily. 120 capsule 0  . losartan-hydrochlorothiazide (HYZAAR) 100-12.5 MG tablet Take 1 tablet by mouth daily.    . naproxen sodium (ANAPROX) 220 MG tablet Take 440 mg by mouth 2 (two) times daily as needed. For pain     No current facility-administered medications for this visit.     Allergies:  Allergies  Allergen Reactions  . Lanolin     Past Medical History, Surgical history, Social history, and Family History were reviewed and updated.   Physical Exam: Blood pressure 113/66, pulse 72,  temperature 98.1 F (36.7 C), temperature source Oral, resp. rate 18, height 5\' 10"  (1.778 m), weight 242 lb 3.2 oz (109.861 kg), SpO2 98 %. ECOG: 0 General appearance: alert and cooperative. Without distress. Head: Normocephalic, without obvious abnormality no oral ulcers or lesions. Neck: no adenopathy Lymph nodes: Cervical, supraclavicular, and axillary nodes  normal. Heart:regular rate and rhythm, S1, S2 normal, no murmur, click, rub or gallop Lung:chest clear, no wheezing, rales, normal symmetric air entry Abdomin: soft, non-tender, without masses or organomegaly no shifting dullness or ascites. EXT:no erythema, induration, or nodules   Lab Results: Lab Results  Component Value Date   WBC 7.3 08/11/2015   HGB 13.4 08/11/2015   HCT 41.5 08/11/2015   MCV 79.1* 08/11/2015   PLT 334 08/11/2015     Chemistry      Component Value Date/Time   NA 137 07/03/2015 1239   NA 142 08/05/2012 1524   K 3.9 07/03/2015 1239   K 4.0 08/05/2012 1524   CL 109 08/05/2012 1524   CO2 24 07/03/2015 1239   CO2 22 04/18/2011 1520   BUN 22.5 07/03/2015 1239   BUN 18 08/05/2012 1524   CREATININE 1.5* 07/03/2015 1239   CREATININE 1.20 08/05/2012 1524      Component Value Date/Time   CALCIUM 9.5 07/03/2015 1239   CALCIUM 8.6 04/18/2011 1520   ALKPHOS 103 07/03/2015 1239   AST 17 07/03/2015 1239   ALT 24 07/03/2015 1239   BILITOT 0.84 07/03/2015 1239        Impression and Plan:  55 year old gentleman with the following issues:  1. 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 His initial diagnosis was in 2012 where he had a Gleason score of 4+5 = 9 and a PSA of 16.6. He underwent a robotic prostatectomy and androgen deprivation. After a PSA nadir he had been on a slow rise and his most recent PSA was 5.6 in November 2016 with possible bony metastasis detected on his most recent imaging studies.  He started xtandi in December 2016 and have tolerated it well. His PSA dropped to 2.6 and the plan is to continue on the same dose and schedule. I see no need for any dose reduction or delay.  2. Hormonal therapy: I have recommended androgen deprivation to be continued indefinitely. He does not report any side effects from this treatment.  3. Bone directed therapy: He is a candidate for bone directed  therapy. This will be addressed in future visits.  4. Prognosis: He understands the goal of therapy is palliative but is a good candidate for aggressive therapy given his excellent performance status.  5. Follow-up: Will be in 6 weeks to assess for any new complications.   [facility], MD 2/14/20173:06 PM

## 2015-08-12 LAB — PSA: Prostate Specific Ag, Serum: 1.2 ng/mL (ref 0.0–4.0)

## 2015-08-12 LAB — PSA (PARALLEL TESTING): PSA: 1.13 ng/mL (ref <=4.00)

## 2015-09-03 ENCOUNTER — Other Ambulatory Visit: Payer: Self-pay | Admitting: *Deleted

## 2015-09-03 DIAGNOSIS — C61 Malignant neoplasm of prostate: Secondary | ICD-10-CM

## 2015-09-03 DIAGNOSIS — Z5111 Encounter for antineoplastic chemotherapy: Secondary | ICD-10-CM

## 2015-09-03 MED ORDER — ENZALUTAMIDE 40 MG PO CAPS: 160.0000 mg | ORAL_CAPSULE | Freq: Every day | ORAL | Status: DC

## 2015-09-03 MED FILL — *XTANDI 40MG CAPSULE: 40 | 30 days supply | Qty: 120 | Fill #0

## 2015-09-23 ENCOUNTER — Telehealth: Payer: Self-pay | Admitting: Oncology

## 2015-09-23 ENCOUNTER — Ambulatory Visit (HOSPITAL_BASED_OUTPATIENT_CLINIC_OR_DEPARTMENT_OTHER): Payer: Managed Care, Other (non HMO) | Admitting: Oncology

## 2015-09-23 VITALS — BP 117/68 | HR 68 | Temp 98.2°F | Resp 18 | Ht 70.0 in | Wt 241.0 lb

## 2015-09-23 DIAGNOSIS — C775 Secondary and unspecified malignant neoplasm of intrapelvic lymph nodes: Secondary | ICD-10-CM

## 2015-09-23 DIAGNOSIS — C61 Malignant neoplasm of prostate: Secondary | ICD-10-CM | POA: Diagnosis not present

## 2015-09-23 DIAGNOSIS — E291 Testicular hypofunction: Secondary | ICD-10-CM

## 2015-09-23 DIAGNOSIS — C7951 Secondary malignant neoplasm of bone: Secondary | ICD-10-CM | POA: Diagnosis not present

## 2015-09-23 NOTE — Telephone Encounter (Signed)
per pof to sch pt appt-gave pt copy of avs °

## 2015-09-23 NOTE — Addendum Note (Signed)
Addended by: Randolm Idol on: 09/23/2015 04:34 PM   Modules accepted: Medications

## 2015-09-23 NOTE — Progress Notes (Signed)
Hematology and Oncology Follow Up Visit  [name] 1960-08-15 55 y.o. 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 His initial diagnosis was in 2012 where he had a Gleason score of 4+5 = 9 and a PSA of 16.6.   Prior Therapy:  He underwent a radical prostatectomy on 04/18/2011 under the care of Dr. Alinda Money. The final pathology showed to have prostate adenocarcinoma with a Gleason score of 4+5 = 9 with tumor involving both lobes. Extraprostatic extension was noted with bilateral regional lymph node were involved with malignancy. He had 5 of 6 lymph nodes noted in the right regional lymph node basin. He also had 4 out of 5 lymph nodes noted on the left. The final pathological staging was T3b N1 with 9 positive lymph nodes out of 11 examined.  He continued to have persistent PSA and was treated with androgen deprivation. His PSA became undetectable until August 2014. In September 2015 PSA increased up to 1.29 and Casodex was added around that time.  In June 2016 PSA went up to 2.0 and subsequently to 5.6 in November 2016. Staging workup including a bone scan done on 05/15/2015 which showed a focus of increased uptake in the posterior parietal occipital region of the skull. There is also area of uptake likely degenerative in nature. CT scan of the abdomen and pelvis done on 05/15/2015 shows sclerotic osseous metastasis noted in multiple locations.  Current therapy:  Androgen deprivation under the care of Dr. Alinda Money. Xtandi started on 06/10/2015.  Interim History: [name] presents today for a follow-up visit. Since the last visit, he continues to do very well. He works full time without any decline in his ability to do so.  He continues on Xtandi and have tolerated it well. He does report mild fatigue which is manageable. He denied any abdominal pain, nausea or less of appetite. He  has not reported any neurological issues or seizures. He does report some nocturia and weak urinary stream. Does not report any skeletal complaints of arthralgias or myalgias.   He does not report any headaches, blurry vision, syncope or seizures. Does not report any fevers, chills, sweats or weight loss. Does not report any chest pain, palpitation, orthopnea or leg edema. Does not report any cough, hemoptysis or wheezing. Does not report any nausea, vomiting, abdominal pain. Does not report any hematochezia, melena or early satiety. Does not report any frequency urgency or hesitancy. Does not report any skeletal complaints. Remaining review of systems unremarkable.  Medications: I have reviewed the patient's current medications.  Current Outpatient Prescriptions  Medication Sig Dispense Refill  . amLODipine (NORVASC) 10 MG tablet Take 10 mg by mouth daily.    Marland Kitchen atorvastatin (LIPITOR) 10 MG tablet Take 10 mg by mouth daily.    . calcium-vitamin D (CALCIUM 500+D) 500-200 MG-UNIT per tablet Take 2 tablets by mouth daily.    . enzalutamide (XTANDI) 40 MG capsule Take 4 capsules (160 mg total) by mouth daily. 120 capsule 0  . losartan-hydrochlorothiazide (HYZAAR) 100-12.5 MG tablet Take 1 tablet by mouth daily.    . naproxen sodium (ANAPROX) 220 MG tablet Take 440 mg by mouth 2 (two) times daily as needed. For pain     No current facility-administered medications for this visit.     Allergies:  Allergies  Allergen Reactions  . Lanolin     Past Medical History, Surgical history, Social history, and  Family History were reviewed and updated.   Physical Exam: Blood pressure 117/68, pulse 68, temperature 98.2 F (36.8 C), temperature source Oral, resp. rate 18, height 5\' 10"  (1.778 m), weight 241 lb (109.317 kg), SpO2 100 %. ECOG: 0 General appearance: alert and cooperative. Well-appearing gentleman appeared comfortable. Head: Normocephalic, without obvious abnormality no oral masses or  lesions. Neck: no adenopathy Lymph nodes: Cervical, supraclavicular, and axillary nodes normal. Heart:regular rate and rhythm, S1, S2 normal, no murmur, click, rub or gallop Lung:chest clear, no wheezing, rales, normal symmetric air entry Abdomin: soft, non-tender, without masses or organomegaly no rebound or guarding. EXT:no erythema, induration, or nodules   Lab Results: Lab Results  Component Value Date   WBC 7.3 08/11/2015   HGB 13.4 08/11/2015   HCT 41.5 08/11/2015   MCV 79.1* 08/11/2015   PLT 334 08/11/2015     Chemistry      Component Value Date/Time   NA 142 08/11/2015 1428   NA 142 08/05/2012 1524   K 3.6 08/11/2015 1428   K 4.0 08/05/2012 1524   CL 109 08/05/2012 1524   CO2 24 08/11/2015 1428   CO2 22 04/18/2011 1520   BUN 24.4 08/11/2015 1428   BUN 18 08/05/2012 1524   CREATININE 1.6* 08/11/2015 1428   CREATININE 1.20 08/05/2012 1524      Component Value Date/Time   CALCIUM 9.0 08/11/2015 1428   CALCIUM 8.6 04/18/2011 1520   ALKPHOS 102 08/11/2015 1428   AST 13 08/11/2015 1428   ALT 17 08/11/2015 1428   BILITOT 0.53 08/11/2015 1428     Results for Fate, [name] (MRN [name]) as of 09/23/2015 16:04  Ref. Range 07/03/2015 12:39 08/11/2015 14:28 08/11/2015 14:28  PSA Latest Ref Range: 0.0-4.0 ng/mL 2.40 1.13 1.2     Impression and Plan:  55 year old gentleman with the following issues:  1. 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 His initial diagnosis was in 2012 where he had a Gleason score of 4+5 = 9 and a PSA of 16.6. He underwent a robotic prostatectomy and androgen deprivation. After a PSA nadir he had been on a slow rise and his most recent PSA was 5.6 in November 2016 with possible bony metastasis detected on his most recent imaging studies.  He started Xtandi in December 2016 and have tolerated it well.   His PSA is down to 1.2 from 5.6 after 2 months of therapy. His PSA was not obtained  today as he missed his laboratory appointment. He reports that he will have his PSA checked by the end of week with Dr. Alinda Money. His laboratory testing and PSA will be repeated in 6 weeks.  2. Hormonal therapy: I have recommended androgen deprivation to be continued indefinitely. He does not report any side effects from this treatment.  3. Bone directed therapy: He is a candidate for bone directed therapy. It would be reasonable to defer this to a later time.  4. Prognosis: He understands the goal of therapy is palliative but is a good candidate for aggressive therapy given his excellent performance status.  5. Follow-up: Will be in 6 weeks to assess for any new complications.   Zola Button, MD 3/29/20174:16 PM

## 2015-10-06 ENCOUNTER — Telehealth: Payer: Self-pay | Admitting: *Deleted

## 2015-10-06 DIAGNOSIS — Z5111 Encounter for antineoplastic chemotherapy: Secondary | ICD-10-CM

## 2015-10-06 DIAGNOSIS — C61 Malignant neoplasm of prostate: Secondary | ICD-10-CM

## 2015-10-06 MED ORDER — ENZALUTAMIDE 40 MG PO CAPS: 160.0000 mg | ORAL_CAPSULE | Freq: Every day | ORAL | Status: DC

## 2015-10-06 MED FILL — *XTANDI 40MG CAPSULE: 40 | 30 days supply | Qty: 120 | Fill #0

## 2015-10-06 NOTE — Telephone Encounter (Signed)
TC from pt's wife requesting refill on Xtandi. OK to refill per Dr. Hazeline Junker last office visit on 09/23/15. Refilled for 1 month.

## 2015-11-03 ENCOUNTER — Telehealth: Payer: Self-pay | Admitting: *Deleted

## 2015-11-03 NOTE — Telephone Encounter (Signed)
Patient's wife called requesting refill of Xtandi at [facility]

## 2015-11-04 ENCOUNTER — Other Ambulatory Visit: Payer: Self-pay | Admitting: *Deleted

## 2015-11-04 ENCOUNTER — Other Ambulatory Visit: Payer: Self-pay | Admitting: Oncology

## 2015-11-04 MED ORDER — ENZALUTAMIDE 40 MG PO CAPS: ORAL_CAPSULE | ORAL | Status: DC

## 2015-11-04 MED FILL — *XTANDI 40MG CAPSULE: 40 | 30 days supply | Qty: 120 | Fill #0

## 2015-11-04 NOTE — Telephone Encounter (Signed)
We just called [facility] and they have not received the Xtandi refilll yet.  The Pharmacy sent an electronic refill and we need to pick this up tomorrow.  Please send refill in today.  Thanks."

## 2015-11-10 ENCOUNTER — Other Ambulatory Visit (HOSPITAL_BASED_OUTPATIENT_CLINIC_OR_DEPARTMENT_OTHER): Payer: Managed Care, Other (non HMO)

## 2015-11-10 ENCOUNTER — Telehealth: Payer: Self-pay | Admitting: Oncology

## 2015-11-10 ENCOUNTER — Ambulatory Visit (HOSPITAL_BASED_OUTPATIENT_CLINIC_OR_DEPARTMENT_OTHER): Payer: Managed Care, Other (non HMO) | Admitting: Oncology

## 2015-11-10 VITALS — BP 149/95 | HR 75 | Temp 98.2°F | Resp 18 | Ht 70.0 in | Wt 238.8 lb

## 2015-11-10 DIAGNOSIS — C61 Malignant neoplasm of prostate: Secondary | ICD-10-CM | POA: Diagnosis not present

## 2015-11-10 DIAGNOSIS — C7951 Secondary malignant neoplasm of bone: Secondary | ICD-10-CM | POA: Diagnosis not present

## 2015-11-10 DIAGNOSIS — C775 Secondary and unspecified malignant neoplasm of intrapelvic lymph nodes: Secondary | ICD-10-CM | POA: Diagnosis not present

## 2015-11-10 DIAGNOSIS — E291 Testicular hypofunction: Secondary | ICD-10-CM | POA: Diagnosis not present

## 2015-11-10 LAB — CBC WITH DIFFERENTIAL/PLATELET
BASO%: 0.8 % (ref 0.0–2.0)
Basophils Absolute: 0.1 10*3/uL (ref 0.0–0.1)
EOS%: 1.4 % (ref 0.0–7.0)
Eosinophils Absolute: 0.1 10*3/uL (ref 0.0–0.5)
HEMATOCRIT: 42.3 % (ref 38.4–49.9)
HEMOGLOBIN: 13.5 g/dL (ref 13.0–17.1)
LYMPH%: 15.2 % (ref 14.0–49.0)
MCH: 25.3 pg — AB (ref 27.2–33.4)
MCHC: 31.9 g/dL — ABNORMAL LOW (ref 32.0–36.0)
MCV: 79.3 fL (ref 79.3–98.0)
MONO#: 0.8 10*3/uL (ref 0.1–0.9)
MONO%: 9.1 % (ref 0.0–14.0)
NEUT%: 73.5 % (ref 39.0–75.0)
NEUTROS ABS: 6.2 10*3/uL (ref 1.5–6.5)
Platelets: 282 10*3/uL (ref 140–400)
RBC: 5.33 10*6/uL (ref 4.20–5.82)
RDW: 16.5 % — AB (ref 11.0–14.6)
WBC: 8.5 10*3/uL (ref 4.0–10.3)
lymph#: 1.3 10*3/uL (ref 0.9–3.3)

## 2015-11-10 LAB — COMPREHENSIVE METABOLIC PANEL
ALBUMIN: 3.4 g/dL — AB (ref 3.5–5.0)
ALK PHOS: 83 U/L (ref 40–150)
ALT: 13 U/L (ref 0–55)
AST: 10 U/L (ref 5–34)
Anion Gap: 7 mEq/L (ref 3–11)
BUN: 20.4 mg/dL (ref 7.0–26.0)
CALCIUM: 9.2 mg/dL (ref 8.4–10.4)
CO2: 22 mEq/L (ref 22–29)
CREATININE: 1.2 mg/dL (ref 0.7–1.3)
Chloride: 109 mEq/L (ref 98–109)
EGFR: 67 mL/min/{1.73_m2} — ABNORMAL LOW (ref >90)
GLUCOSE: 95 mg/dL (ref 70–140)
POTASSIUM: 4 meq/L (ref 3.5–5.1)
SODIUM: 138 meq/L (ref 136–145)
Total Bilirubin: 0.57 mg/dL (ref 0.20–1.20)
Total Protein: 7.3 g/dL (ref 6.4–8.3)

## 2015-11-10 NOTE — Progress Notes (Signed)
Hematology and Oncology Follow Up Visit  Renner Seabrook [id number] Aug 31, 1960 55 y.o. 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 His initial diagnosis was in 2012 where he had a Gleason score of 4+5 = 9 and a PSA of 16.6.   Prior Therapy:  He underwent a radical prostatectomy on 04/18/2011 under the care of Dr. Alinda Money. The final pathology showed to have prostate adenocarcinoma with a Gleason score of 4+5 = 9 with tumor involving both lobes. Extraprostatic extension was noted with bilateral regional lymph node were involved with malignancy. He had 5 of 6 lymph nodes noted in the right regional lymph node basin. He also had 4 out of 5 lymph nodes noted on the left. The final pathological staging was T3b N1 with 9 positive lymph nodes out of 11 examined.  He continued to have persistent PSA and was treated with androgen deprivation. His PSA became undetectable until August 2014. In September 2015 PSA increased up to 1.29 and Casodex was added around that time.  In June 2016 PSA went up to 2.0 and subsequently to 5.6 in November 2016. Staging workup including a bone scan done on 05/15/2015 which showed a focus of increased uptake in the posterior parietal occipital region of the skull. There is also area of uptake likely degenerative in nature. CT scan of the abdomen and pelvis done on 05/15/2015 shows sclerotic osseous metastasis noted in multiple locations.  Current therapy:  Androgen deprivation under the care of Dr. Alinda Money. Xtandi started on 06/10/2015.  Interim History: Mr. Smethurst presents today for a follow-up visit. Since the last visit, he reports no changes in his health. He reports his blood pressure medication needs to be refilled and he is awaiting follow-up with his primary care physician to do so. Because of that he has not taken any blood pressure medication and his overall energy  have been better. He does report some occasional headaches but no other complaints. He continues to do very well. He works full time without any decline in his ability to do so.    He continues on Xtandi and have tolerated it well. He does report mild fatigue which is manageable. He denied any abdominal pain, nausea or less of appetite. He has not reported any neurological issues or seizures. He does report some nocturia and weak urinary stream. Does not report any skeletal complaints of arthralgias or myalgias.   He does not report any blurry vision, syncope or seizures. Does not report any fevers, chills, sweats or weight loss. Does not report any chest pain, palpitation, orthopnea or leg edema. Does not report any cough, hemoptysis or wheezing. Does not report any nausea, vomiting, abdominal pain. Does not report any hematochezia, melena or early satiety. Does not report any frequency urgency or hesitancy. Does not report any skeletal complaints. Remaining review of systems unremarkable.  Medications: I have reviewed the patient's current medications.  Current Outpatient Prescriptions  Medication Sig Dispense Refill  . amLODipine (NORVASC) 10 MG tablet Take 10 mg by mouth daily.    Marland Kitchen atorvastatin (LIPITOR) 10 MG tablet Take 10 mg by mouth daily.    . calcium-vitamin D (CALCIUM 500+D) 500-200 MG-UNIT per tablet Take 2 tablets by mouth daily.    . enzalutamide (XTANDI) 40 MG capsule TAKE 4 CAPSULES BY MOUTH DAILY. 120 capsule 0  . losartan-hydrochlorothiazide (HYZAAR) 100-12.5 MG tablet Take 1 tablet by mouth daily.    Marland Kitchen  naproxen sodium (ANAPROX) 220 MG tablet Take 440 mg by mouth 2 (two) times daily as needed. For pain     No current facility-administered medications for this visit.     Allergies:  Allergies  Allergen Reactions  . Lanolin     Past Medical History, Surgical history, Social history, and Family History were reviewed and updated.   Physical Exam: Blood pressure 149/95,  pulse 75, temperature 98.2 F (36.8 C), temperature source Oral, resp. rate 18, height 5\' 10"  (1.778 m), weight 238 lb 12.8 oz (108.319 kg), SpO2 98 %. ECOG: 0 General appearance: Well-appearing gentleman without distress. Head: Normocephalic, without obvious abnormality no oral thrush noted. Neck: no adenopathy Lymph nodes: Cervical, supraclavicular, and axillary nodes normal. Heart:regular rate and rhythm, S1, S2 normal, no murmur, click, rub or gallop Lung:chest clear, no wheezing, rales, normal symmetric air entry Abdomin: soft, non-tender, without masses or organomegaly no shifting dullness or ascites. EXT:no erythema, induration, or nodules   Lab Results: Lab Results  Component Value Date   WBC 8.5 11/10/2015   HGB 13.5 11/10/2015   HCT 42.3 11/10/2015   MCV 79.3 11/10/2015   PLT 282 11/10/2015     Chemistry      Component Value Date/Time   NA 142 08/11/2015 1428   NA 142 08/05/2012 1524   K 3.6 08/11/2015 1428   K 4.0 08/05/2012 1524   CL 109 08/05/2012 1524   CO2 24 08/11/2015 1428   CO2 22 04/18/2011 1520   BUN 24.4 08/11/2015 1428   BUN 18 08/05/2012 1524   CREATININE 1.6* 08/11/2015 1428   CREATININE 1.20 08/05/2012 1524      Component Value Date/Time   CALCIUM 9.0 08/11/2015 1428   CALCIUM 8.6 04/18/2011 1520   ALKPHOS 102 08/11/2015 1428   AST 13 08/11/2015 1428   ALT 17 08/11/2015 1428   BILITOT 0.53 08/11/2015 1428     Results for Zenk, Christine (MRN [id number]) as of 09/23/2015 16:04  Ref. Range 07/03/2015 12:39 08/11/2015 14:28 08/11/2015 14:28  PSA Latest Ref Range: 0.0-4.0 ng/mL 2.40 1.13 1.2     Impression and Plan:  55 year old gentleman with the following issues:  1. 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 His initial diagnosis was in 2012 where he had a Gleason score of 4+5 = 9 and a PSA of 16.6. He underwent a robotic prostatectomy and androgen deprivation. After a PSA nadir he had  been on a slow rise and his most recent PSA was 5.6 in November 2016 with possible bony metastasis detected on his most recent imaging studies.  He started Xtandi in December 2016 and have tolerated it well. His PSA is down to 1.2 from 5.6 after 2 months of therapy and was down to 0.8 and April 2017. The plan is to continue the same dose and schedule without any dose reduction or delay.  2. Hormonal therapy: I have recommended androgen deprivation to be continued indefinitely. He will receive Lupron at the [facility] starting in July 2017. His last injection was in March and the care of Dr. Alinda Money.  3. Bone directed therapy: He is a candidate for bone directed therapy. It would be reasonable to defer this to a later time.  4. Prognosis: Therapy is palliative but he continues to have excellent performance status and quality of life. Plan is to continue with aggressive measures moving forward.  5. Follow-up: Will be in 6 weeks to assess for any new complications.   Alper Guilmette,  MD 5/16/20173:52 PM

## 2015-11-10 NOTE — Telephone Encounter (Signed)
Gave pt apt & avs °

## 2015-11-11 ENCOUNTER — Telehealth: Payer: Self-pay | Admitting: *Deleted

## 2015-11-11 LAB — PSA: PROSTATE SPECIFIC AG, SERUM: 0.7 ng/mL (ref 0.0–4.0)

## 2015-11-11 NOTE — Telephone Encounter (Signed)
-----   Message from [name], MD sent at 11/11/2015  8:06 AM EDT ----- Please call his PSA. Down again.

## 2015-11-11 NOTE — Telephone Encounter (Signed)
Lm on answering machine for patient to call me [phone]

## 2015-11-13 ENCOUNTER — Telehealth: Payer: Self-pay

## 2015-11-13 NOTE — Telephone Encounter (Signed)
Wife called requesting PSA results. Given.

## 2015-12-08 ENCOUNTER — Other Ambulatory Visit: Payer: Self-pay | Admitting: Oncology

## 2015-12-08 MED FILL — *XTANDI 40MG CAPSULE: 40 | 30 days supply | Qty: 120 | Fill #0

## 2015-12-24 ENCOUNTER — Ambulatory Visit (HOSPITAL_BASED_OUTPATIENT_CLINIC_OR_DEPARTMENT_OTHER): Payer: Managed Care, Other (non HMO) | Admitting: Oncology

## 2015-12-24 ENCOUNTER — Other Ambulatory Visit (HOSPITAL_BASED_OUTPATIENT_CLINIC_OR_DEPARTMENT_OTHER): Payer: Managed Care, Other (non HMO)

## 2015-12-24 ENCOUNTER — Telehealth: Payer: Self-pay | Admitting: Oncology

## 2015-12-24 VITALS — BP 145/87 | HR 70 | Temp 99.0°F | Resp 20 | Ht 70.0 in | Wt 242.4 lb

## 2015-12-24 DIAGNOSIS — C775 Secondary and unspecified malignant neoplasm of intrapelvic lymph nodes: Secondary | ICD-10-CM

## 2015-12-24 DIAGNOSIS — C7951 Secondary malignant neoplasm of bone: Secondary | ICD-10-CM

## 2015-12-24 DIAGNOSIS — E291 Testicular hypofunction: Secondary | ICD-10-CM

## 2015-12-24 DIAGNOSIS — C61 Malignant neoplasm of prostate: Secondary | ICD-10-CM

## 2015-12-24 LAB — COMPREHENSIVE METABOLIC PANEL
ALBUMIN: 3.5 g/dL (ref 3.5–5.0)
ALK PHOS: 81 U/L (ref 40–150)
ALT: 12 U/L (ref 0–55)
AST: 11 U/L (ref 5–34)
Anion Gap: 8 mEq/L (ref 3–11)
BUN: 19.7 mg/dL (ref 7.0–26.0)
CALCIUM: 9.1 mg/dL (ref 8.4–10.4)
CHLORIDE: 107 meq/L (ref 98–109)
CO2: 26 mEq/L (ref 22–29)
Creatinine: 1.2 mg/dL (ref 0.7–1.3)
EGFR: 69 mL/min/{1.73_m2} — AB (ref >90)
Glucose: 104 mg/dl (ref 70–140)
POTASSIUM: 4.3 meq/L (ref 3.5–5.1)
SODIUM: 141 meq/L (ref 136–145)
Total Bilirubin: 0.32 mg/dL (ref 0.20–1.20)
Total Protein: 7.2 g/dL (ref 6.4–8.3)

## 2015-12-24 LAB — CBC WITH DIFFERENTIAL/PLATELET
BASO%: 0.7 % (ref 0.0–2.0)
BASOS ABS: 0.1 10*3/uL (ref 0.0–0.1)
EOS ABS: 0.2 10*3/uL (ref 0.0–0.5)
EOS%: 2.6 % (ref 0.0–7.0)
HEMATOCRIT: 43.8 % (ref 38.4–49.9)
HEMOGLOBIN: 14.1 g/dL (ref 13.0–17.1)
LYMPH%: 14.7 % (ref 14.0–49.0)
MCH: 25.8 pg — AB (ref 27.2–33.4)
MCHC: 32.3 g/dL (ref 32.0–36.0)
MCV: 79.8 fL (ref 79.3–98.0)
MONO#: 0.6 10*3/uL (ref 0.1–0.9)
MONO%: 7.5 % (ref 0.0–14.0)
NEUT#: 6.4 10*3/uL (ref 1.5–6.5)
NEUT%: 74.5 % (ref 39.0–75.0)
Platelets: 278 10*3/uL (ref 140–400)
RBC: 5.49 10*6/uL (ref 4.20–5.82)
RDW: 16.1 % — ABNORMAL HIGH (ref 11.0–14.6)
WBC: 8.6 10*3/uL (ref 4.0–10.3)
lymph#: 1.3 10*3/uL (ref 0.9–3.3)

## 2015-12-24 NOTE — Telephone Encounter (Signed)
Gave patient avs report and appointments for September.  °

## 2015-12-24 NOTE — Progress Notes (Signed)
Hematology and Oncology Follow Up Visit  [name] [id number] 21-Jun-1961 55 y.o. 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 His initial diagnosis was in 2012 where he had a Gleason score of 4+5 = 9 and a PSA of 16.6.   Prior Therapy:  He underwent a radical prostatectomy on 04/18/2011 under the care of Dr. Alinda Money. The final pathology showed to have prostate adenocarcinoma with a Gleason score of 4+5 = 9 with tumor involving both lobes. Extraprostatic extension was noted with bilateral regional lymph node were involved with malignancy. He had 5 of 6 lymph nodes noted in the right regional lymph node basin. He also had 4 out of 5 lymph nodes noted on the left. The final pathological staging was T3b N1 with 9 positive lymph nodes out of 11 examined.  He continued to have persistent PSA and was treated with androgen deprivation. His PSA became undetectable until August 2014. In September 2015 PSA increased up to 1.29 and Casodex was added around that time.  In June 2016 PSA went up to 2.0 and subsequently to 5.6 in November 2016. Staging workup including a bone scan done on 05/15/2015 which showed a focus of increased uptake in the posterior parietal occipital region of the skull. There is also area of uptake likely degenerative in nature. CT scan of the abdomen and pelvis done on 05/15/2015 shows sclerotic osseous metastasis noted in multiple locations.  Current therapy:  Androgen deprivation utilizing Lupron 30 mg every 4 months his first injection is scheduled in September 2017. He previously received Lupron at [facility]. Xtandi started on 06/10/2015.  Interim History: [name] presents today for a follow-up visit. Since the last visit, he continues to do well without any major changes in his health or new complaints.  He works full time without any decline in his ability to do  so. He continues to be active and performs activities of daily living. He continues on Xtandi and have tolerated it well. He does report mild fatigue which is manageable. He denied any abdominal pain, nausea or less of appetite. He has not reported any neurological issues or seizures. He does report some nocturia and weak urinary stream. He stopped his blood pressure medication per instructions from his primary care physician.   He does not report any blurry vision, syncope or seizures. Does not report any fevers, chills, sweats or weight loss. Does not report any chest pain, palpitation, orthopnea or leg edema. Does not report any cough, hemoptysis or wheezing. Does not report any nausea, vomiting, abdominal pain. Does not report any hematochezia, melena or early satiety. Does not report any frequency urgency or hesitancy. Does not report any skeletal complaints. Remaining review of systems unremarkable.  Medications: I have reviewed the patient's current medications.  Current Outpatient Prescriptions  Medication Sig Dispense Refill  . atorvastatin (LIPITOR) 10 MG tablet Take 10 mg by mouth daily.    . calcium-vitamin D (CALCIUM 500+D) 500-200 MG-UNIT per tablet Take 2 tablets by mouth daily.    . naproxen sodium (ANAPROX) 220 MG tablet Take 440 mg by mouth 2 (two) times daily as needed. For pain    . XTANDI 40 MG capsule TAKE 4 CAPSULES BY MOUTH DAILY. 120 capsule 0   No current facility-administered medications for this visit.     Allergies:  Allergies  Allergen Reactions  . Lanolin Itching and Rash    Past Medical  History, Surgical history, Social history, and Family History were reviewed and updated.   Physical Exam: Blood pressure 145/87, pulse 70, temperature 99 F (37.2 C), temperature source Oral, resp. rate 20, height 5\' 10"  (1.778 m), weight 242 lb 6.4 oz (109.952 kg), SpO2 98 %. ECOG: 0 General appearance: Alert, awake gentleman without distress. Head: Normocephalic,  without obvious abnormality no oral ulcers or lesions. Neck: no adenopathy Lymph nodes: Cervical, supraclavicular, and axillary nodes normal. Heart:regular rate and rhythm, S1, S2 normal, no murmur, click, rub or gallop Lung:chest clear, no wheezing, rales, normal symmetric air entry Abdomin: soft, non-tender, without masses or organomegaly no rebound or guarding. EXT:no erythema, induration, or nodules   Lab Results: Lab Results  Component Value Date   WBC 8.6 12/24/2015   HGB 14.1 12/24/2015   HCT 43.8 12/24/2015   MCV 79.8 12/24/2015   PLT 278 12/24/2015     Chemistry      Component Value Date/Time   NA 141 12/24/2015 1525   NA 142 08/05/2012 1524   K 4.3 12/24/2015 1525   K 4.0 08/05/2012 1524   CL 109 08/05/2012 1524   CO2 26 12/24/2015 1525   CO2 22 04/18/2011 1520   BUN 19.7 12/24/2015 1525   BUN 18 08/05/2012 1524   CREATININE 1.2 12/24/2015 1525   CREATININE 1.20 08/05/2012 1524      Component Value Date/Time   CALCIUM 9.1 12/24/2015 1525   CALCIUM 8.6 04/18/2011 1520   ALKPHOS 81 12/24/2015 1525   AST 11 12/24/2015 1525   ALT 12 12/24/2015 1525   BILITOT 0.32 12/24/2015 1525      Results for COXDyllon, [name] (MRN [id number]) as of 12/24/2015 15:31  Ref. Range 08/11/2015 14:28 11/10/2015 15:22  PSA Latest Ref Range: 0.0-4.0 ng/mL 1.2 0.7     Impression and Plan:  55 year old gentleman with the following issues:  1. 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 His initial diagnosis was in 2012 where he had a Gleason score of 4+5 = 9 and a PSA of 16.6. He underwent a robotic prostatectomy and androgen deprivation. After a PSA nadir he had been on a slow rise and his most recent PSA was 5.6 in November 2016 with possible bony metastasis detected on his most recent imaging studies.  He started Xtandi in December 2016 and have tolerated it well. His PSA continues to respond with dropped down to 0.7. The plan  is to continue with the same dose and schedule of this medication given his excellent response and reasonable tolerance. He is not objections to continuing at this time.  2. Androgen depravation: I have recommended androgen deprivation to be continued indefinitely. He will receive Lupron at the [facility] starting in September of 2017. His last injection was in March and the care of Dr. Alinda Money and he prefers to receive it with his next visit rather than to come on an earlier date.  3. Bone directed therapy: He is a candidate for bone directed therapy. It would be reasonable to defer this to a later time.  4. Prognosis: Therapy is palliative but he continues to have excellent performance status and quality of life. He remains very functional and his disease is under reasonable control and I agree with continuing aggressive treatment.  5. Follow-up: Will be in September 2017.   Zola Button, MD 6/29/20174:23 PM

## 2016-01-01 ENCOUNTER — Encounter: Payer: Self-pay | Admitting: *Deleted

## 2016-01-04 ENCOUNTER — Other Ambulatory Visit: Payer: Self-pay | Admitting: Oncology

## 2016-01-05 ENCOUNTER — Other Ambulatory Visit: Payer: Self-pay | Admitting: *Deleted

## 2016-01-05 DIAGNOSIS — C61 Malignant neoplasm of prostate: Secondary | ICD-10-CM

## 2016-01-05 MED ORDER — ENZALUTAMIDE 40 MG PO CAPS: ORAL_CAPSULE | ORAL | Status: DC

## 2016-01-05 MED FILL — *XTANDI 40MG CAPSULE: 40 | 30 days supply | Qty: 120 | Fill #0

## 2016-02-02 ENCOUNTER — Other Ambulatory Visit: Payer: Self-pay | Admitting: Oncology

## 2016-02-02 DIAGNOSIS — C61 Malignant neoplasm of prostate: Secondary | ICD-10-CM

## 2016-02-04 MED FILL — *XTANDI 40MG CAPSULE: 40 | 30 days supply | Qty: 120 | Fill #0

## 2016-03-02 ENCOUNTER — Other Ambulatory Visit: Payer: Self-pay | Admitting: Oncology

## 2016-03-02 DIAGNOSIS — C61 Malignant neoplasm of prostate: Secondary | ICD-10-CM

## 2016-03-02 MED FILL — *XTANDI 40MG CAPSULE: 40 | 30 days supply | Qty: 120 | Fill #0

## 2016-03-04 ENCOUNTER — Other Ambulatory Visit (HOSPITAL_BASED_OUTPATIENT_CLINIC_OR_DEPARTMENT_OTHER): Payer: Managed Care, Other (non HMO)

## 2016-03-04 ENCOUNTER — Telehealth: Payer: Self-pay | Admitting: Oncology

## 2016-03-04 ENCOUNTER — Ambulatory Visit (HOSPITAL_BASED_OUTPATIENT_CLINIC_OR_DEPARTMENT_OTHER): Payer: Managed Care, Other (non HMO)

## 2016-03-04 ENCOUNTER — Ambulatory Visit (HOSPITAL_BASED_OUTPATIENT_CLINIC_OR_DEPARTMENT_OTHER): Payer: Managed Care, Other (non HMO) | Admitting: Oncology

## 2016-03-04 VITALS — BP 151/84 | HR 66 | Temp 98.6°F | Resp 18 | Ht 70.0 in | Wt 245.3 lb

## 2016-03-04 DIAGNOSIS — C775 Secondary and unspecified malignant neoplasm of intrapelvic lymph nodes: Secondary | ICD-10-CM

## 2016-03-04 DIAGNOSIS — C61 Malignant neoplasm of prostate: Secondary | ICD-10-CM

## 2016-03-04 DIAGNOSIS — Z5111 Encounter for antineoplastic chemotherapy: Secondary | ICD-10-CM | POA: Diagnosis not present

## 2016-03-04 DIAGNOSIS — E291 Testicular hypofunction: Secondary | ICD-10-CM | POA: Diagnosis not present

## 2016-03-04 DIAGNOSIS — C7951 Secondary malignant neoplasm of bone: Secondary | ICD-10-CM | POA: Diagnosis not present

## 2016-03-04 DIAGNOSIS — R319 Hematuria, unspecified: Secondary | ICD-10-CM

## 2016-03-04 LAB — COMPREHENSIVE METABOLIC PANEL
ALBUMIN: 3.3 g/dL — AB (ref 3.5–5.0)
ALK PHOS: 87 U/L (ref 40–150)
ALT: 11 U/L (ref 0–55)
ANION GAP: 9 meq/L (ref 3–11)
AST: 11 U/L (ref 5–34)
BUN: 15.9 mg/dL (ref 7.0–26.0)
CALCIUM: 9 mg/dL (ref 8.4–10.4)
CHLORIDE: 106 meq/L (ref 98–109)
CO2: 24 mEq/L (ref 22–29)
Creatinine: 1.1 mg/dL (ref 0.7–1.3)
EGFR: 74 mL/min/{1.73_m2} — AB (ref >90)
Glucose: 89 mg/dl (ref 70–140)
POTASSIUM: 4.4 meq/L (ref 3.5–5.1)
Sodium: 139 mEq/L (ref 136–145)
Total Bilirubin: 0.42 mg/dL (ref 0.20–1.20)
Total Protein: 7.2 g/dL (ref 6.4–8.3)

## 2016-03-04 LAB — CBC WITH DIFFERENTIAL/PLATELET
BASO%: 0.7 % (ref 0.0–2.0)
BASOS ABS: 0.1 10*3/uL (ref 0.0–0.1)
EOS ABS: 0.3 10*3/uL (ref 0.0–0.5)
EOS%: 3.2 % (ref 0.0–7.0)
HEMATOCRIT: 43.7 % (ref 38.4–49.9)
HEMOGLOBIN: 14.2 g/dL (ref 13.0–17.1)
LYMPH#: 1.4 10*3/uL (ref 0.9–3.3)
LYMPH%: 15.6 % (ref 14.0–49.0)
MCH: 25.9 pg — AB (ref 27.2–33.4)
MCHC: 32.5 g/dL (ref 32.0–36.0)
MCV: 79.8 fL (ref 79.3–98.0)
MONO#: 0.6 10*3/uL (ref 0.1–0.9)
MONO%: 7.1 % (ref 0.0–14.0)
NEUT#: 6.5 10*3/uL (ref 1.5–6.5)
NEUT%: 73.4 % (ref 39.0–75.0)
PLATELETS: 312 10*3/uL (ref 140–400)
RBC: 5.48 10*6/uL (ref 4.20–5.82)
RDW: 15.4 % — AB (ref 11.0–14.6)
WBC: 8.9 10*3/uL (ref 4.0–10.3)

## 2016-03-04 MED ORDER — LEUPROLIDE ACETATE (4 MONTH) 30 MG IM KIT
30.0000 mg | PACK | Freq: Once | INTRAMUSCULAR | Status: AC
  Administered 2016-03-04: 30 mg via INTRAMUSCULAR
  Filled 2016-03-04: qty 30

## 2016-03-04 NOTE — Patient Instructions (Signed)
Leuprolide depot injection What is this medicine? LEUPROLIDE (loo PROE lide) is a man-made protein that acts like a natural hormone in the body. It decreases testosterone in men and decreases estrogen in women. In men, this medicine is used to treat advanced prostate cancer. In women, some forms of this medicine may be used to treat endometriosis, uterine fibroids, or other male hormone-related problems. This medicine may be used for other purposes; ask your health care provider or pharmacist if you have questions. What should I tell my health care provider before I take this medicine? They need to know if you have any of these conditions: -diabetes -heart disease or previous heart attack -high blood pressure -high cholesterol -osteoporosis -pain or difficulty passing urine -spinal cord metastasis -stroke -tobacco smoker -unusual vaginal bleeding (women) -an unusual or allergic reaction to leuprolide, benzyl alcohol, other medicines, foods, dyes, or preservatives -pregnant or trying to get pregnant -breast-feeding How should I use this medicine? This medicine is for injection into a muscle or for injection under the skin. It is given by a health care professional in a hospital or clinic setting. The specific product will determine how it will be given to you. Make sure you understand which product you receive and how often you will receive it. Talk to your pediatrician regarding the use of this medicine in children. Special care may be needed. Overdosage: If you think you have taken too much of this medicine contact a poison control center or emergency room at once. NOTE: This medicine is only for you. Do not share this medicine with others. What if I miss a dose? It is important not to miss a dose. Call your doctor or health care professional if you are unable to keep an appointment. Depot injections: Depot injections are given either once-monthly, every 12 weeks, every 16 weeks, or  every 24 weeks depending on the product you are prescribed. The product you are prescribed will be based on if you are male or male, and your condition. Make sure you understand your product and dosing. What may interact with this medicine? Do not take this medicine with any of the following medications: -chasteberry This medicine may also interact with the following medications: -herbal or dietary supplements, like black cohosh or DHEA -male hormones, like estrogens or progestins and birth control pills, patches, rings, or injections -male hormones, like testosterone This list may not describe all possible interactions. Give your health care provider a list of all the medicines, herbs, non-prescription drugs, or dietary supplements you use. Also tell them if you smoke, drink alcohol, or use illegal drugs. Some items may interact with your medicine. What should I watch for while using this medicine? Visit your doctor or health care professional for regular checks on your progress. During the first weeks of treatment, your symptoms may get worse, but then will improve as you continue your treatment. You may get hot flashes, increased bone pain, increased difficulty passing urine, or an aggravation of nerve symptoms. Discuss these effects with your doctor or health care professional, some of them may improve with continued use of this medicine. Male patients may experience a menstrual cycle or spotting during the first months of therapy with this medicine. If this continues, contact your doctor or health care professional. What side effects may I notice from receiving this medicine? Side effects that you should report to your doctor or health care professional as soon as possible: -allergic reactions like skin rash, itching or hives, swelling of the   face, lips, or tongue -breathing problems -chest pain -depression or memory disorders -pain in your legs or groin -pain at site where injected or  implanted -severe headache -swelling of the feet and legs -visual changes -vomiting Side effects that usually do not require medical attention (report to your doctor or health care professional if they continue or are bothersome): -breast swelling or tenderness -decrease in sex drive or performance -diarrhea -hot flashes -loss of appetite -muscle, joint, or bone pains -nausea -redness or irritation at site where injected or implanted -skin problems or acne This list may not describe all possible side effects. Call your doctor for medical advice about side effects. You may report side effects to FDA at 1-800-FDA-1088. Where should I keep my medicine? This drug is given in a hospital or clinic and will not be stored at home. NOTE: This sheet is a summary. It may not cover all possible information. If you have questions about this medicine, talk to your doctor, pharmacist, or health care provider.    2016, [employer]vier/Gold Standard. (2014-03-07 14:16:23)  

## 2016-03-04 NOTE — Progress Notes (Signed)
Hematology and Oncology Follow Up Visit  Queen Weyman [id number] [birth year] 55 y.o. 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 His initial diagnosis was in 2012 where he had a Gleason score of 4+5 = 9 and a PSA of 16.6.   Prior Therapy:  He underwent a radical prostatectomy on 04/18/2011 under the care of Dr. Alinda Money. The final pathology showed to have prostate adenocarcinoma with a Gleason score of 4+5 = 9 with tumor involving both lobes. Extraprostatic extension was noted with bilateral regional lymph node were involved with malignancy. He had 5 of 6 lymph nodes noted in the right regional lymph node basin. He also had 4 out of 5 lymph nodes noted on the left. The final pathological staging was T3b N1 with 9 positive lymph nodes out of 11 examined.  He continued to have persistent PSA and was treated with androgen deprivation. His PSA became undetectable until August 2014. In September 2015 PSA increased up to 1.29 and Casodex was added around that time.  In June 2016 PSA went up to 2.0 and subsequently to 5.6 in November 2016. Staging workup including a bone scan done on 05/15/2015 which showed a focus of increased uptake in the posterior parietal occipital region of the skull. There is also area of uptake likely degenerative in nature. CT scan of the abdomen and pelvis done on 05/15/2015 shows sclerotic osseous metastasis noted in multiple locations.  Current therapy:  Androgen deprivation utilizing Lupron 30 mg every 4 months his first injection is scheduled in September 2017. He previously received Lupron at [facility]. Xtandi started on 06/10/2015.  Interim History: Mr. [name] presents today for a follow-up visit. Since the last visit, he reports episodic hematuria. He reports his urine flow is reasonably normal and that intermittently he would notice episode of bleeding and  pressure in his flanks. He denied any fevers or chills he does report some occasional sweats. He does report history of kidney stones and have had similar symptoms associated with it and have had lithotripsy in the past.  He continues on Xtandi and have tolerated it well. He does report mild fatigue which is manageable. He denied any abdominal pain, nausea or less of appetite. He has not reported any neurological issues or seizures. He does report some nocturia and weak urinary stream. He continues to work full-time and attends to activities of daily living without any decline.   He does not report any blurry vision, syncope or seizures. Does not report any fevers, chills, sweats or weight loss. Does not report any chest pain, palpitation, orthopnea or leg edema. Does not report any cough, hemoptysis or wheezing. Does not report any nausea, vomiting, abdominal pain. Does not report any hematochezia, melena or early satiety. Does not report any frequency urgency or hesitancy. Does not report any skeletal complaints. Remaining review of systems unremarkable.  Medications: I have reviewed the patient's current medications.  Current Outpatient Prescriptions  Medication Sig Dispense Refill  . atorvastatin (LIPITOR) 10 MG tablet Take 10 mg by mouth daily.    . calcium-vitamin D (CALCIUM 500+D) 500-200 MG-UNIT per tablet Take 2 tablets by mouth daily.    . naproxen sodium (ANAPROX) 220 MG tablet Take 440 mg by mouth 2 (two) times daily as needed. For pain    . XTANDI 40 MG capsule TAKE 4 CAPSULES BY MOUTH DAILY. 120 capsule 0   No current facility-administered medications for  this visit.      Allergies:  Allergies  Allergen Reactions  . Lanolin Itching and Rash    Past Medical History, Surgical history, Social history, and Family History were reviewed and updated.   Physical Exam: Blood pressure (!) 151/84, pulse 66, temperature 98.6 F (37 C), temperature source Oral, resp. rate 18, height 5'  10" (1.778 m), weight 245 lb 4.8 oz (111.3 kg), SpO2 100 %. ECOG: 0 General appearance: Well-appearing gentleman without distress. Head: Normocephalic, without obvious abnormality no oral ulcers or thrush. Neck: no adenopathy Lymph nodes: Cervical, supraclavicular, and axillary nodes normal. Heart:regular rate and rhythm, S1, S2 normal, no murmur, click, rub or gallop Lung:chest clear, no wheezing, rales, normal symmetric air entry Abdomin: soft, non-tender, without masses or organomegaly no shifting dullness or ascites. EXT:no erythema, induration, or nodules   Lab Results: Lab Results  Component Value Date   WBC 8.9 03/04/2016   HGB 14.2 03/04/2016   HCT 43.7 03/04/2016   MCV 79.8 03/04/2016   PLT 312 03/04/2016     Chemistry      Component Value Date/Time   NA 141 12/24/2015 1525   K 4.3 12/24/2015 1525   CL 109 08/05/2012 1524   CO2 26 12/24/2015 1525   BUN 19.7 12/24/2015 1525   CREATININE 1.2 12/24/2015 1525      Component Value Date/Time   CALCIUM 9.1 12/24/2015 1525   ALKPHOS 81 12/24/2015 1525   AST 11 12/24/2015 1525   ALT 12 12/24/2015 1525   BILITOT 0.32 12/24/2015 1525      Results for COXWadsworth, [place] (MRN [id number]) as of 12/24/2015 15:31  Ref. Range 08/11/2015 14:28 11/10/2015 15:22  PSA Latest Ref Range: 0.0-4.0 ng/mL 1.2 0.7     Impression and Plan:  55 year old gentleman with the following issues:  1. 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 His initial diagnosis was in 2012 where he had a Gleason score of 4+5 = 9 and a PSA of 16.6. He underwent a robotic prostatectomy and androgen deprivation. After a PSA nadir he had been on a slow rise and his most recent PSA was 5.6 in November 2016 with possible bony metastasis detected on his most recent imaging studies.  He started Xtandi in December 2016 and have tolerated it well. His PSA continues to respond with dropped down to 0.7. The plan is to  continue with the same dose and schedule And uses different salvage therapy upon symptomatic progression. His PSA is currently pending from today.  2. Androgen depravation: I have recommended androgen deprivation to be continued indefinitely. He will receive Lupron at the [facility] starting in September of 2017. This will be repeated after 07/05/2015.  3. Bone directed therapy: He is a candidate for bone directed therapy. It would be reasonable to defer this to a later time.  4. Hematuria: Related to nephrolithiasis although bladder tumor or urinary tract infection also possibility. I have asked him to follow-up with Dr. Alinda Money regarding this issue.  4. Prognosis: Therapy is palliative but he continues to have excellent performance status and quality of life. He remains very functional and his disease is under reasonable control.   5. Follow-up: Will be in 2 months.   [facility], MD 9/8/20173:43 PM

## 2016-03-04 NOTE — Telephone Encounter (Signed)
GAVE PATIENT AVS REPORT AND APPOINTMENTS FOR November.  °

## 2016-03-05 LAB — PSA: PROSTATE SPECIFIC AG, SERUM: 0.7 ng/mL (ref 0.0–4.0)

## 2016-03-05 LAB — TESTOSTERONE: Testosterone, Serum: 29 ng/dL — ABNORMAL LOW (ref 264–916)

## 2016-04-04 ENCOUNTER — Other Ambulatory Visit: Payer: Self-pay | Admitting: Oncology

## 2016-04-04 DIAGNOSIS — C61 Malignant neoplasm of prostate: Secondary | ICD-10-CM

## 2016-04-05 ENCOUNTER — Other Ambulatory Visit: Payer: Self-pay | Admitting: *Deleted

## 2016-04-05 DIAGNOSIS — C61 Malignant neoplasm of prostate: Secondary | ICD-10-CM

## 2016-04-05 MED ORDER — ENZALUTAMIDE 40 MG PO CAPS: 160.0000 mg | ORAL_CAPSULE | Freq: Every day | ORAL | 0 refills | Status: DC

## 2016-04-05 MED FILL — *XTANDI 40MG CAPSULE: 40 | 30 days supply | Qty: 120 | Fill #0

## 2016-05-02 MED FILL — XTANDI 40 MG CAPSULE: 40 | 30 days supply | Qty: 120 | Fill #0

## 2016-05-05 ENCOUNTER — Ambulatory Visit (HOSPITAL_BASED_OUTPATIENT_CLINIC_OR_DEPARTMENT_OTHER): Payer: Managed Care, Other (non HMO) | Admitting: Oncology

## 2016-05-05 ENCOUNTER — Telehealth: Payer: Self-pay | Admitting: Oncology

## 2016-05-05 ENCOUNTER — Other Ambulatory Visit (HOSPITAL_BASED_OUTPATIENT_CLINIC_OR_DEPARTMENT_OTHER): Payer: Managed Care, Other (non HMO)

## 2016-05-05 VITALS — BP 142/81 | HR 79 | Temp 97.5°F | Resp 20 | Ht 70.0 in | Wt 246.2 lb

## 2016-05-05 DIAGNOSIS — E291 Testicular hypofunction: Secondary | ICD-10-CM

## 2016-05-05 DIAGNOSIS — C775 Secondary and unspecified malignant neoplasm of intrapelvic lymph nodes: Secondary | ICD-10-CM | POA: Diagnosis not present

## 2016-05-05 DIAGNOSIS — C61 Malignant neoplasm of prostate: Secondary | ICD-10-CM

## 2016-05-05 DIAGNOSIS — C7951 Secondary malignant neoplasm of bone: Secondary | ICD-10-CM | POA: Diagnosis not present

## 2016-05-05 DIAGNOSIS — R319 Hematuria, unspecified: Secondary | ICD-10-CM

## 2016-05-05 LAB — COMPREHENSIVE METABOLIC PANEL
ALT: 9 U/L (ref 0–55)
ANION GAP: 11 meq/L (ref 3–11)
AST: 10 U/L (ref 5–34)
Albumin: 3.2 g/dL — ABNORMAL LOW (ref 3.5–5.0)
Alkaline Phosphatase: 82 U/L (ref 40–150)
BILIRUBIN TOTAL: 0.66 mg/dL (ref 0.20–1.20)
BUN: 21.1 mg/dL (ref 7.0–26.0)
CALCIUM: 9.2 mg/dL (ref 8.4–10.4)
CHLORIDE: 106 meq/L (ref 98–109)
CO2: 24 meq/L (ref 22–29)
Creatinine: 1.4 mg/dL — ABNORMAL HIGH (ref 0.7–1.3)
EGFR: 55 mL/min/{1.73_m2} — AB (ref >90)
Glucose: 159 mg/dl — ABNORMAL HIGH (ref 70–140)
Potassium: 4 mEq/L (ref 3.5–5.1)
Sodium: 141 mEq/L (ref 136–145)
TOTAL PROTEIN: 7 g/dL (ref 6.4–8.3)

## 2016-05-05 LAB — CBC WITH DIFFERENTIAL/PLATELET
BASO%: 0.3 % (ref 0.0–2.0)
Basophils Absolute: 0 10*3/uL (ref 0.0–0.1)
EOS ABS: 0.2 10*3/uL (ref 0.0–0.5)
EOS%: 1.8 % (ref 0.0–7.0)
HEMATOCRIT: 41.8 % (ref 38.4–49.9)
HGB: 13.6 g/dL (ref 13.0–17.1)
LYMPH#: 1.2 10*3/uL (ref 0.9–3.3)
LYMPH%: 12.2 % — AB (ref 14.0–49.0)
MCH: 26.3 pg — ABNORMAL LOW (ref 27.2–33.4)
MCHC: 32.5 g/dL (ref 32.0–36.0)
MCV: 80.9 fL (ref 79.3–98.0)
MONO#: 0.5 10*3/uL (ref 0.1–0.9)
MONO%: 4.9 % (ref 0.0–14.0)
NEUT#: 7.9 10*3/uL — ABNORMAL HIGH (ref 1.5–6.5)
NEUT%: 80.8 % — AB (ref 39.0–75.0)
PLATELETS: 282 10*3/uL (ref 140–400)
RBC: 5.17 10*6/uL (ref 4.20–5.82)
RDW: 14.7 % — ABNORMAL HIGH (ref 11.0–14.6)
WBC: 9.8 10*3/uL (ref 4.0–10.3)

## 2016-05-05 NOTE — Progress Notes (Signed)
Hematology and Oncology Follow Up Visit  Carder Below [id number] 02/14/1961 54 y.o. 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 His initial diagnosis was in 2012 where he had a Gleason score of 4+5 = 9 and a PSA of 16.6.   Prior Therapy:  He underwent a radical prostatectomy on 04/18/2011 under the care of Dr. Alinda Money. The final pathology showed to have prostate adenocarcinoma with a Gleason score of 4+5 = 9 with tumor involving both lobes. Extraprostatic extension was noted with bilateral regional lymph node were involved with malignancy. He had 5 of 6 lymph nodes noted in the right regional lymph node basin. He also had 4 out of 5 lymph nodes noted on the left. The final pathological staging was T3b N1 with 9 positive lymph nodes out of 11 examined.  He continued to have persistent PSA and was treated with androgen deprivation. His PSA became undetectable until August 2014. In September 2015 PSA increased up to 1.29 and Casodex was added around that time.  In June 2016 PSA went up to 2.0 and subsequently to 5.6 in November 2016. Staging workup including a bone scan done on 05/15/2015 which showed a focus of increased uptake in the posterior parietal occipital region of the skull. There is also area of uptake likely degenerative in nature. CT scan of the abdomen and pelvis done on 05/15/2015 shows sclerotic osseous metastasis noted in multiple locations.  Current therapy:  Androgen deprivation utilizing Lupron 30 mg every 4 months his first injection received September 2017. He previously received Lupron at [facility]. This will be repeated every 4 months his next injection is on 07/06/2016. Xtandi started on 06/10/2015.  Interim History: Mr. Guidice presents today for a follow-up visit. Since the last visit, he reports no major complaints. He did have episodic hematuria that has  resolved at this time and he believes he passed a kidney stone. He denied any fevers or chills he does report some occasional sweats. He does report history of kidney stones and have had similar symptoms associated with it. He did also reports symptoms gas when he passes urine but has not happened recently.  He continues on Xtandi and have tolerated it well. He denied any abdominal pain, nausea or less of appetite. He has not reported any neurological issues or seizures. He continues to enjoy excellent performance status and continues to work full time. He denied any recent hospitalization or illnesses.   He does not report any blurry vision, syncope or seizures. Does not report any fevers, chills, sweats or weight loss. Does not report any chest pain, palpitation, orthopnea or leg edema. Does not report any cough, hemoptysis or wheezing. Does not report any nausea, vomiting, abdominal pain. Does not report any hematochezia, melena or early satiety. Does not report any frequency urgency or hesitancy. Does not report any skeletal complaints. Remaining review of systems unremarkable.  Medications: I have reviewed the patient's current medications.  Current Outpatient Prescriptions  Medication Sig Dispense Refill  . atorvastatin (LIPITOR) 10 MG tablet Take 10 mg by mouth daily.    . calcium-vitamin D (CALCIUM 500+D) 500-200 MG-UNIT per tablet Take 2 tablets by mouth daily.    . enzalutamide (XTANDI) 40 MG capsule Take 4 capsules (160 mg total) by mouth daily. 120 capsule 0  . naproxen sodium (ANAPROX) 220 MG tablet Take 440 mg by mouth 2 (two) times daily as needed. For pain  No current facility-administered medications for this visit.      Allergies:  Allergies  Allergen Reactions  . Lanolin Itching and Rash    Past Medical History, Surgical history, Social history, and Family History were reviewed and updated.   Physical Exam: Blood pressure (!) 142/81, pulse 79, temperature 97.5 F  (36.4 C), temperature source Oral, resp. rate 20, height 5\' 10"  (1.778 m), weight 246 lb 3.2 oz (111.7 kg), SpO2 99 %. ECOG: 0 General appearance:  Alert, awake gentleman without distress. Head: Normocephalic, without obvious abnormality no oral thrush noted. Neck: no adenopathy Lymph nodes: Cervical, supraclavicular, and axillary nodes normal. Heart:regular rate and rhythm, S1, S2 normal, no murmur, click, rub or gallop Lung:chest clear, no wheezing, rales, normal symmetric air entry Abdomin: soft, non-tender, without masses or organomegaly no rebound or guarding. EXT:no erythema, induration, or nodules   Lab Results: Lab Results  Component Value Date   WBC 9.8 05/05/2016   HGB 13.6 05/05/2016   HCT 41.8 05/05/2016   MCV 80.9 05/05/2016   PLT 282 05/05/2016     Chemistry      Component Value Date/Time   NA 139 03/04/2016 1514   K 4.4 03/04/2016 1514   CL 109 08/05/2012 1524   CO2 24 03/04/2016 1514   BUN 15.9 03/04/2016 1514   CREATININE 1.1 03/04/2016 1514      Component Value Date/Time   CALCIUM 9.0 03/04/2016 1514   ALKPHOS 87 03/04/2016 1514   AST 11 03/04/2016 1514   ALT 11 03/04/2016 1514   BILITOT 0.42 03/04/2016 1514       Results for Joynt, Jahzier (MRN [id number]) as of 05/05/2016 13:50  Ref. Range 11/10/2015 15:22 03/04/2016 15:14  PSA Latest Ref Range: 0.0 - 4.0 ng/mL 0.7 0.7     Impression and Plan:  55 year old gentleman with the following issues:  1. 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 His initial diagnosis was in 2012 where he had a Gleason score of 4+5 = 9 and a PSA of 16.6. He underwent a robotic prostatectomy and androgen deprivation. After a PSA nadir he had been on a slow rise and his most recent PSA was 5.6 in November 2016 with possible bony metastasis detected on his most recent imaging studies.   He started Xtandi in December 2016 and have tolerated it well. His PSA continues to  reasonably controlled and remains stable at 0.7. The plan is to continue the same dose and schedule of these different salvage therapy upon symptomatic progression.  2. Androgen depravation: I have recommended androgen deprivation to be continued indefinitely. He received Lupron at the [facility] starting in September of 2017. This will be repeated after 07/05/2015.  3. Bone directed therapy: He is a candidate for bone directed therapy. It would be reasonable to defer this to a later time.  4. Hematuria: Related to nephrolithiasis although bladder tumor or urinary tract infection also possibility. I have asked him to follow-up with Dr. Alinda Money regarding this issue.  4. Prognosis: Remains excellent despite his incurable malignancy. Continues to have excellent performance status at this time.  5. Follow-up: Will be in 2 months.   [id number], MD 11/9/20172:07 PM

## 2016-05-05 NOTE — Telephone Encounter (Signed)
Appointments scheduled per 05/05/16 los. AVS report and appointment schedule was given to patient per 05/05/16 los.

## 2016-05-06 ENCOUNTER — Telehealth: Payer: Self-pay | Admitting: *Deleted

## 2016-05-06 LAB — PSA: Prostate Specific Ag, Serum: 0.7 ng/mL (ref 0.0–4.0)

## 2016-05-06 NOTE — Telephone Encounter (Signed)
-----   Message from [name], MD sent at 05/06/2016  8:23 AM EST ----- Please let him know his PSA is still very low.

## 2016-05-06 NOTE — Progress Notes (Signed)
Patient's cell phone does not have voicemail set-up. Left 2 messages on home phone to call me, for latest PSA result.

## 2016-05-06 NOTE — Telephone Encounter (Signed)
L/m for patient to call me  

## 2016-05-11 ENCOUNTER — Telehealth: Payer: Self-pay

## 2016-05-11 ENCOUNTER — Telehealth: Payer: Self-pay | Admitting: *Deleted

## 2016-05-11 NOTE — Telephone Encounter (Signed)
This RN called and spoke with wife Vaughan Basta about PSA results. She verbalized understanding.

## 2016-05-11 NOTE — Telephone Encounter (Signed)
[name] called requesting PSA results. Called her back and gave results.

## 2016-06-06 ENCOUNTER — Other Ambulatory Visit: Payer: Self-pay | Admitting: Oncology

## 2016-06-06 DIAGNOSIS — C61 Malignant neoplasm of prostate: Secondary | ICD-10-CM

## 2016-06-09 ENCOUNTER — Telehealth: Payer: Self-pay | Admitting: Pharmacist

## 2016-06-09 MED FILL — XTANDI 40 MG CAPSULE: 40 | 30 days supply | Qty: 120 | Fill #0

## 2016-06-09 NOTE — Telephone Encounter (Signed)
Received fax communication from [place] that [facility] rx was rejected due to cost exceeds maximum. Per Gaspar Bidding - pharmacist at outpatient pharmacy this is usually due to an expired Prior Authorization.   I called [employer] to confirm. Yes (per Patty) the PA expired on 06/03/16. I completed the PA renewal over the phone.  PA approved. Reference approval #: [id number] This authorization is effective from 06/09/16 - 06/09/17.  Notified Bryan at [place]. Rx went through and was able to be filled.   Raul Del, PharmD, BCPS, BCOP Oral [facility]otherapy [facility] [phone]

## 2016-07-04 ENCOUNTER — Telehealth: Payer: Self-pay | Admitting: Oncology

## 2016-07-04 NOTE — Telephone Encounter (Signed)
spoke to patient to advise that appointment has been changed from 07/06/16 to 07/19/16 at 1:15 PM

## 2016-07-05 ENCOUNTER — Other Ambulatory Visit: Payer: Self-pay | Admitting: Oncology

## 2016-07-05 DIAGNOSIS — C61 Malignant neoplasm of prostate: Secondary | ICD-10-CM

## 2016-07-06 ENCOUNTER — Other Ambulatory Visit: Payer: Managed Care, Other (non HMO)

## 2016-07-06 ENCOUNTER — Ambulatory Visit: Payer: Managed Care, Other (non HMO) | Admitting: Oncology

## 2016-07-06 ENCOUNTER — Ambulatory Visit: Payer: Managed Care, Other (non HMO)

## 2016-07-06 MED FILL — XTANDI 40 MG CAPSULE: 40 | 30 days supply | Qty: 120 | Fill #0

## 2016-07-19 ENCOUNTER — Ambulatory Visit: Payer: Managed Care, Other (non HMO) | Admitting: Oncology

## 2016-07-19 ENCOUNTER — Ambulatory Visit (HOSPITAL_BASED_OUTPATIENT_CLINIC_OR_DEPARTMENT_OTHER): Payer: Managed Care, Other (non HMO) | Admitting: Oncology

## 2016-07-19 ENCOUNTER — Other Ambulatory Visit (HOSPITAL_BASED_OUTPATIENT_CLINIC_OR_DEPARTMENT_OTHER): Payer: Managed Care, Other (non HMO)

## 2016-07-19 ENCOUNTER — Ambulatory Visit (HOSPITAL_BASED_OUTPATIENT_CLINIC_OR_DEPARTMENT_OTHER): Payer: Managed Care, Other (non HMO)

## 2016-07-19 VITALS — BP 158/81 | HR 67 | Temp 98.2°F | Resp 18 | Ht 70.0 in | Wt 246.6 lb

## 2016-07-19 DIAGNOSIS — C61 Malignant neoplasm of prostate: Secondary | ICD-10-CM

## 2016-07-19 DIAGNOSIS — C7951 Secondary malignant neoplasm of bone: Secondary | ICD-10-CM

## 2016-07-19 DIAGNOSIS — E291 Testicular hypofunction: Secondary | ICD-10-CM | POA: Diagnosis not present

## 2016-07-19 DIAGNOSIS — C775 Secondary and unspecified malignant neoplasm of intrapelvic lymph nodes: Secondary | ICD-10-CM | POA: Diagnosis not present

## 2016-07-19 DIAGNOSIS — Z5111 Encounter for antineoplastic chemotherapy: Secondary | ICD-10-CM | POA: Diagnosis not present

## 2016-07-19 LAB — CBC WITH DIFFERENTIAL/PLATELET
BASO%: 0.4 % (ref 0.0–2.0)
Basophils Absolute: 0 10*3/uL (ref 0.0–0.1)
EOS ABS: 0.1 10*3/uL (ref 0.0–0.5)
EOS%: 1.7 % (ref 0.0–7.0)
HCT: 45 % (ref 38.4–49.9)
HGB: 14.9 g/dL (ref 13.0–17.1)
LYMPH%: 18.7 % (ref 14.0–49.0)
MCH: 26.9 pg — ABNORMAL LOW (ref 27.2–33.4)
MCHC: 33.1 g/dL (ref 32.0–36.0)
MCV: 81.4 fL (ref 79.3–98.0)
MONO#: 0.6 10*3/uL (ref 0.1–0.9)
MONO%: 7.7 % (ref 0.0–14.0)
NEUT%: 71.5 % (ref 39.0–75.0)
NEUTROS ABS: 5.1 10*3/uL (ref 1.5–6.5)
Platelets: 247 10*3/uL (ref 140–400)
RBC: 5.53 10*6/uL (ref 4.20–5.82)
RDW: 14.7 % — ABNORMAL HIGH (ref 11.0–14.6)
WBC: 7.1 10*3/uL (ref 4.0–10.3)
lymph#: 1.3 10*3/uL (ref 0.9–3.3)

## 2016-07-19 LAB — COMPREHENSIVE METABOLIC PANEL
ALT: 10 U/L (ref 0–55)
AST: 11 U/L (ref 5–34)
Albumin: 3.6 g/dL (ref 3.5–5.0)
Alkaline Phosphatase: 91 U/L (ref 40–150)
Anion Gap: 9 mEq/L (ref 3–11)
BILIRUBIN TOTAL: 0.46 mg/dL (ref 0.20–1.20)
BUN: 18.6 mg/dL (ref 7.0–26.0)
CHLORIDE: 111 meq/L — AB (ref 98–109)
CO2: 22 meq/L (ref 22–29)
Calcium: 9.3 mg/dL (ref 8.4–10.4)
Creatinine: 1.1 mg/dL (ref 0.7–1.3)
EGFR: 71 mL/min/{1.73_m2} — AB (ref >90)
GLUCOSE: 106 mg/dL (ref 70–140)
POTASSIUM: 3.8 meq/L (ref 3.5–5.1)
SODIUM: 143 meq/L (ref 136–145)
TOTAL PROTEIN: 7.1 g/dL (ref 6.4–8.3)

## 2016-07-19 MED ORDER — LEUPROLIDE ACETATE (4 MONTH) 30 MG IM KIT
30.0000 mg | PACK | Freq: Once | INTRAMUSCULAR | Status: AC
  Administered 2016-07-19: 30 mg via INTRAMUSCULAR
  Filled 2016-07-19: qty 30

## 2016-07-19 NOTE — Patient Instructions (Signed)
Leuprolide depot injection What is this medicine? LEUPROLIDE (loo PROE lide) is a man-made protein that acts like a natural hormone in the body. It decreases testosterone in men and decreases estrogen in women. In men, this medicine is used to treat advanced prostate cancer. In women, some forms of this medicine may be used to treat endometriosis, uterine fibroids, or other male hormone-related problems. This medicine may be used for other purposes; ask your health care provider or pharmacist if you have questions. What should I tell my health care provider before I take this medicine? They need to know if you have any of these conditions: -diabetes -heart disease or previous heart attack -high blood pressure -high cholesterol -osteoporosis -pain or difficulty passing urine -spinal cord metastasis -stroke -tobacco smoker -unusual vaginal bleeding (women) -an unusual or allergic reaction to leuprolide, benzyl alcohol, other medicines, foods, dyes, or preservatives -pregnant or trying to get pregnant -breast-feeding How should I use this medicine? This medicine is for injection into a muscle or for injection under the skin. It is given by a health care professional in a hospital or clinic setting. The specific product will determine how it will be given to you. Make sure you understand which product you receive and how often you will receive it. Talk to your pediatrician regarding the use of this medicine in children. Special care may be needed. Overdosage: If you think you have taken too much of this medicine contact a poison control center or emergency room at once. NOTE: This medicine is only for you. Do not share this medicine with others. What if I miss a dose? It is important not to miss a dose. Call your doctor or health care professional if you are unable to keep an appointment. Depot injections: Depot injections are given either once-monthly, every 12 weeks, every 16 weeks, or  every 24 weeks depending on the product you are prescribed. The product you are prescribed will be based on if you are male or male, and your condition. Make sure you understand your product and dosing. What may interact with this medicine? Do not take this medicine with any of the following medications: -chasteberry This medicine may also interact with the following medications: -herbal or dietary supplements, like black cohosh or DHEA -male hormones, like estrogens or progestins and birth control pills, patches, rings, or injections -male hormones, like testosterone This list may not describe all possible interactions. Give your health care provider a list of all the medicines, herbs, non-prescription drugs, or dietary supplements you use. Also tell them if you smoke, drink alcohol, or use illegal drugs. Some items may interact with your medicine. What should I watch for while using this medicine? Visit your doctor or health care professional for regular checks on your progress. During the first weeks of treatment, your symptoms may get worse, but then will improve as you continue your treatment. You may get hot flashes, increased bone pain, increased difficulty passing urine, or an aggravation of nerve symptoms. Discuss these effects with your doctor or health care professional, some of them may improve with continued use of this medicine. Male patients may experience a menstrual cycle or spotting during the first months of therapy with this medicine. If this continues, contact your doctor or health care professional. What side effects may I notice from receiving this medicine? Side effects that you should report to your doctor or health care professional as soon as possible: -allergic reactions like skin rash, itching or hives, swelling of the   face, lips, or tongue -breathing problems -chest pain -depression or memory disorders -pain in your legs or groin -pain at site where injected or  implanted -severe headache -swelling of the feet and legs -visual changes -vomiting Side effects that usually do not require medical attention (report to your doctor or health care professional if they continue or are bothersome): -breast swelling or tenderness -decrease in sex drive or performance -diarrhea -hot flashes -loss of appetite -muscle, joint, or bone pains -nausea -redness or irritation at site where injected or implanted -skin problems or acne This list may not describe all possible side effects. Call your doctor for medical advice about side effects. You may report side effects to FDA at 1-800-FDA-1088. Where should I keep my medicine? This drug is given in a hospital or clinic and will not be stored at home. NOTE: This sheet is a summary. It may not cover all possible information. If you have questions about this medicine, talk to your doctor, pharmacist, or health care provider.    2016, [employer]vier/Gold Standard. (2014-03-07 14:16:23)  

## 2016-07-19 NOTE — Progress Notes (Signed)
Hematology and Oncology Follow Up Visit  Cali Golson [id number] September 15, 1960 56 y.o. 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 His initial diagnosis was in 2012 where he had a Gleason score of 4+5 = 9 and a PSA of 16.6.   Prior Therapy:  He underwent a radical prostatectomy on 04/18/2011 under the care of Dr. Alinda Money. The final pathology showed to have prostate adenocarcinoma with a Gleason score of 4+5 = 9 with tumor involving both lobes. Extraprostatic extension was noted with bilateral regional lymph node were involved with malignancy. He had 5 of 6 lymph nodes noted in the right regional lymph node basin. He also had 4 out of 5 lymph nodes noted on the left. The final pathological staging was T3b N1 with 9 positive lymph nodes out of 11 examined.  He continued to have persistent PSA and was treated with androgen deprivation. His PSA became undetectable until August 2014. In September 2015 PSA increased up to 1.29 and Casodex was added around that time.  In June 2016 PSA went up to 2.0 and subsequently to 5.6 in November 2016. Staging workup including a bone scan done on 05/15/2015 which showed a focus of increased uptake in the posterior parietal occipital region of the skull. There is also area of uptake likely degenerative in nature. CT scan of the abdomen and pelvis done on 05/15/2015 shows sclerotic osseous metastasis noted in multiple locations.  Current therapy:  Androgen deprivation utilizing Lupron 30 mg every 4 months his first injection received September 2017. This will be repeated every 4 months. Xtandi started on 06/10/2015.  Interim History: Mr. Linden presents today for a follow-up visit. Since the last visit, he reports developing bilateral breast tenderness predominantly on the right side. He did feel a lump in his right breast and mammography did not reveal any abnormal  masses or lesions. His mammograms suggested gynecomastia. He does have family history of male breast cancer as well.  He continues on Xtandi and have tolerated it well. He denied any abdominal pain, nausea or less of appetite. He has not reported any neurological issues or seizures. He continues to enjoy excellent performance status and continues to work full time. He denied any recent hospitalization or illnesses. He does report occasional hot flashes which have not changed dramatically.   He does not report any blurry vision, syncope or seizures. Does not report any fevers, chills, sweats or weight loss. Does not report any chest pain, palpitation, orthopnea or leg edema. Does not report any cough, hemoptysis or wheezing. Does not report any nausea, vomiting, abdominal pain. Does not report any hematochezia, melena or early satiety. Does not report any frequency urgency or hesitancy. Does not report any skeletal complaints. Remaining review of systems unremarkable.  Medications: I have reviewed the patient's current medications.  Current Outpatient Prescriptions  Medication Sig Dispense Refill  . atorvastatin (LIPITOR) 10 MG tablet Take 10 mg by mouth daily.    . calcium-vitamin D (CALCIUM 500+D) 500-200 MG-UNIT per tablet Take 2 tablets by mouth daily.    . naproxen sodium (ANAPROX) 220 MG tablet Take 440 mg by mouth 2 (two) times daily as needed. For pain    . XTANDI 40 MG capsule TAKE 4 CAPSULES BY MOUTH DAILY. 120 capsule 0   No current facility-administered medications for this visit.      Allergies:  Allergies  Allergen Reactions  . Lanolin Itching and  Rash    Past Medical History, Surgical history, Social history, and Family History were reviewed and updated.   Physical Exam: Blood pressure (!) 158/81, pulse 67, temperature 98.2 F (36.8 C), temperature source Oral, resp. rate 18, height 5\' 10"  (1.778 m), weight 246 lb 9.6 oz (111.9 kg), SpO2 98 %. ECOG: 0 General appearance:   Well-appearing gentleman without distress. Head: Normocephalic, without obvious abnormality no oral ulcers or lesions. Neck: no adenopathy Lymph nodes: Cervical, supraclavicular, and axillary nodes normal. Heart:regular rate and rhythm, S1, S2 normal, no murmur, click, rub or gallop Lung:chest clear, no wheezing, rales, normal symmetric air entry Abdomin: soft, non-tender, without masses or organomegaly no shifting dullness or ascites. Breast examination: Slight tenderness noted bilaterally. Very small hard nodule noted in the right breast. EXT:no erythema, induration, or nodules   Lab Results: Lab Results  Component Value Date   WBC 7.1 07/19/2016   HGB 14.9 07/19/2016   HCT 45.0 07/19/2016   MCV 81.4 07/19/2016   PLT 247 07/19/2016     Chemistry      Component Value Date/Time   NA 143 07/19/2016 1329   K 3.8 07/19/2016 1329   CL 109 08/05/2012 1524   CO2 22 07/19/2016 1329   BUN 18.6 07/19/2016 1329   CREATININE 1.1 07/19/2016 1329      Component Value Date/Time   CALCIUM 9.3 07/19/2016 1329   ALKPHOS 91 07/19/2016 1329   AST 11 07/19/2016 1329   ALT 10 07/19/2016 1329   BILITOT 0.46 07/19/2016 1329       Results for Buehring, Adaiah (MRN [id number]) as of 05/05/2016 13:50  Ref. Range 11/10/2015 15:22 03/04/2016 15:14  PSA Latest Ref Range: 0.0 - 4.0 ng/mL 0.7 0.7     Impression and Plan:  56 year old gentleman with the following issues:  1. 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 His initial diagnosis was in 2012 where he had a Gleason score of 4+5 = 9 and a PSA of 16.6. He underwent a robotic prostatectomy and androgen deprivation. After a PSA nadir he had been on a slow rise and his most recent PSA was 5.6 in November 2016 with possible bony metastasis detected on his most recent imaging studies.   He started Xtandi in December 2016 and have tolerated it well. His PSA continues to reasonably controlled and  remains stable at 0.7.   Risks and benefits of continuing extending reviewed today and is agreeable to continue.  2. Androgen depravation: I have recommended androgen deprivation to be continued indefinitely. He received Lupron at the [facility] starting in September of 2017. He will receive Lupron today on 07/19/2016 and repeated after 11/16/2016.  3. Bone directed therapy: He is a candidate for bone directed therapy. It would be reasonable to defer this to a later time.  4. Gynecomastia: I offered him radiation therapy for palliative purposes but he declined. He feels that his pain is very minimal at this time and does not require intervention.  5. Family history of male breast cancer and personal history of prostate cancer: He might benefit from genetic screening given his personal and family history. We will continue to discuss that with him in future visits.  6. Follow-up: Will be in 2 months.   Zola Button, MD 1/23/20182:16 PM

## 2016-07-20 ENCOUNTER — Telehealth: Payer: Self-pay | Admitting: *Deleted

## 2016-07-20 LAB — PSA: Prostate Specific Ag, Serum: 1 ng/mL (ref 0.0–4.0)

## 2016-07-20 NOTE — Telephone Encounter (Signed)
As noted below by Dr. Alen Blew, I left a message for patient to call us back and give him PSA results. PSA is 1.0. Per Dr. Alen Blew, stay on [place]. This message needs to be given to patient.

## 2016-07-20 NOTE — Telephone Encounter (Signed)
-----   Message from [name], MD sent at 07/20/2016  8:42 AM EST ----- Please let him know PSA showed little change. Keep on [place]

## 2016-07-20 NOTE — Telephone Encounter (Signed)
-----   Message from [name], MD sent at 07/20/2016  8:42 AM EST ----- Please let him know PSA showed little change. Keep on [place]

## 2016-07-25 ENCOUNTER — Telehealth: Payer: Self-pay | Admitting: *Deleted

## 2016-07-25 NOTE — Telephone Encounter (Signed)
As noted by Dr. Alen Blew, I informed patient's wife of PSA level. Wife verbalized understanding.

## 2016-07-25 NOTE — Telephone Encounter (Signed)
Patient would like PSA results

## 2016-07-25 NOTE — Telephone Encounter (Signed)
Please let him know his PSA did not change much.

## 2016-08-02 ENCOUNTER — Other Ambulatory Visit: Payer: Self-pay | Admitting: Oncology

## 2016-08-02 ENCOUNTER — Other Ambulatory Visit: Payer: Self-pay | Admitting: *Deleted

## 2016-08-02 DIAGNOSIS — C61 Malignant neoplasm of prostate: Secondary | ICD-10-CM

## 2016-08-02 MED ORDER — ENZALUTAMIDE 40 MG PO CAPS: 160.0000 mg | ORAL_CAPSULE | Freq: Every day | ORAL | 0 refills | Status: DC

## 2016-08-09 ENCOUNTER — Telehealth: Payer: Self-pay | Admitting: Pharmacist

## 2016-08-09 NOTE — Telephone Encounter (Signed)
Oral Chemotherapy Pharmacist Encounter  I was notified by [place] that PA for patient's [name] has been approved. Due to [employer], patient's prescription will be transferred to their [place], [place] location. There is a note in the patient's chart to ask patient about any secondary insurance and copay card information when they call the patient to schedule delivery.  No further needs from [facility] identified at this time. [facility] will sign off. Please let us know if we can be of assistance in the future.  Johny Drilling, PharmD, BCPS, BCOP 08/09/2016  12:31 PM [facility]cology [facility] [phone]

## 2016-08-09 NOTE — Telephone Encounter (Signed)
Oral Chemotherapy Pharmacist Encounter  Received call from [place] on 08/02/16 that patient has new insurance and would no longer be able to fill his Xtandi at their pharmacy.  Xtandi prescription e-scribed to [facility] ([facility] in [place], [place] on 2/6. I faxed clinical and demographic information to the pharmacy the same day (f: [phone]). I noted on fax cover sheet that patient has new insurance, is also on his wife's insurance, and has a copay card for his Gillermina Phy.  I only had a copay on the patient's insurance card, so I notified the pharmacy they will have to obtain secondary insurance and copay card information fromt he patient.  I called [facility] ([facility] in [place], [place], today (08/09/16) at [phone] for status update of patient's Xtandi. Prescription was sent 08/02/16. Prior authorization has been initiated, is in process. They do not need anything from the office at this time. Technician will call me to update on status later today. I reminded technician of secondary insurance and copay card info they will have to obtain from the patient.  Oral Oncology Clinic will continue to follow.   Johny Drilling, PharmD, BCPS, BCOP 08/09/2016  9:56 AM Oral Oncology Clinic [phone]

## 2016-08-09 NOTE — Telephone Encounter (Signed)
Oral Chemotherapy Pharmacist Encounter  Received notification from [facility] that prior authorization for Xtandi has been approved Effective dates: 08/09/16-08/09/18  Johny Drilling, PharmD, BCPS, BCOP 08/09/2016  2:14 PM Oral Oncology Clinic [phone]

## 2016-08-11 ENCOUNTER — Telehealth: Payer: Self-pay

## 2016-08-11 NOTE — Telephone Encounter (Signed)
..  Oral Chemotherapy Pharmacist Encounter   Received notification that [name] has been dispensed and sent out for Xtandi 40 mg on 08/09/16.   Thank you,  Henreitta Leber, PharmD Oral Chemotherapy Clinic

## 2016-08-29 ENCOUNTER — Other Ambulatory Visit: Payer: Self-pay | Admitting: Oncology

## 2016-08-29 DIAGNOSIS — C61 Malignant neoplasm of prostate: Secondary | ICD-10-CM

## 2016-09-13 ENCOUNTER — Ambulatory Visit (HOSPITAL_BASED_OUTPATIENT_CLINIC_OR_DEPARTMENT_OTHER): Payer: Managed Care, Other (non HMO) | Admitting: Oncology

## 2016-09-13 ENCOUNTER — Telehealth: Payer: Self-pay | Admitting: Oncology

## 2016-09-13 ENCOUNTER — Other Ambulatory Visit (HOSPITAL_BASED_OUTPATIENT_CLINIC_OR_DEPARTMENT_OTHER): Payer: Managed Care, Other (non HMO)

## 2016-09-13 VITALS — BP 170/93 | HR 71 | Temp 98.1°F | Resp 19 | Wt 247.3 lb

## 2016-09-13 DIAGNOSIS — C61 Malignant neoplasm of prostate: Secondary | ICD-10-CM

## 2016-09-13 DIAGNOSIS — E291 Testicular hypofunction: Secondary | ICD-10-CM | POA: Diagnosis not present

## 2016-09-13 LAB — COMPREHENSIVE METABOLIC PANEL
ALBUMIN: 3.7 g/dL (ref 3.5–5.0)
ALT: 13 U/L (ref 0–55)
AST: 12 U/L (ref 5–34)
Alkaline Phosphatase: 87 U/L (ref 40–150)
Anion Gap: 9 mEq/L (ref 3–11)
BUN: 21.5 mg/dL (ref 7.0–26.0)
CALCIUM: 9.3 mg/dL (ref 8.4–10.4)
CHLORIDE: 109 meq/L (ref 98–109)
CO2: 26 mEq/L (ref 22–29)
CREATININE: 1.4 mg/dL — AB (ref 0.7–1.3)
EGFR: 57 mL/min/{1.73_m2} — ABNORMAL LOW (ref >90)
Glucose: 86 mg/dl (ref 70–140)
Potassium: 4.3 mEq/L (ref 3.5–5.1)
Sodium: 143 mEq/L (ref 136–145)
Total Bilirubin: 0.38 mg/dL (ref 0.20–1.20)
Total Protein: 7.3 g/dL (ref 6.4–8.3)

## 2016-09-13 LAB — CBC WITH DIFFERENTIAL/PLATELET
BASO%: 0.8 % (ref 0.0–2.0)
Basophils Absolute: 0.1 10*3/uL (ref 0.0–0.1)
EOS%: 1.8 % (ref 0.0–7.0)
Eosinophils Absolute: 0.2 10*3/uL (ref 0.0–0.5)
HCT: 45.6 % (ref 38.4–49.9)
HGB: 15.2 g/dL (ref 13.0–17.1)
LYMPH%: 14.2 % (ref 14.0–49.0)
MCH: 27.6 pg (ref 27.2–33.4)
MCHC: 33.3 g/dL (ref 32.0–36.0)
MCV: 82.6 fL (ref 79.3–98.0)
MONO#: 0.6 10*3/uL (ref 0.1–0.9)
MONO%: 6.6 % (ref 0.0–14.0)
NEUT%: 76.6 % — ABNORMAL HIGH (ref 39.0–75.0)
NEUTROS ABS: 6.9 10*3/uL — AB (ref 1.5–6.5)
PLATELETS: 271 10*3/uL (ref 140–400)
RBC: 5.52 10*6/uL (ref 4.20–5.82)
RDW: 15.3 % — ABNORMAL HIGH (ref 11.0–14.6)
WBC: 9 10*3/uL (ref 4.0–10.3)
lymph#: 1.3 10*3/uL (ref 0.9–3.3)

## 2016-09-13 NOTE — Progress Notes (Signed)
Hematology and Oncology Follow Up Visit  [name] [id number] [birth year] 56 y.o. 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 His initial diagnosis was in 2012 where he had a Gleason score of 4+5 = 9 and a PSA of 16.6.   Prior Therapy:  He underwent a radical prostatectomy on 04/18/2011 under the care of Dr. Alinda Money. The final pathology showed to have prostate adenocarcinoma with a Gleason score of 4+5 = 9 with tumor involving both lobes. Extraprostatic extension was noted with bilateral regional lymph node were involved with malignancy. He had 5 of 6 lymph nodes noted in the right regional lymph node basin. He also had 4 out of 5 lymph nodes noted on the left. The final pathological staging was T3b N1 with 9 positive lymph nodes out of 11 examined.  He continued to have persistent PSA and was treated with androgen deprivation. His PSA became undetectable until August 2014. In September 2015 PSA increased up to 1.29 and Casodex was added around that time.  In June 2016 PSA went up to 2.0 and subsequently to 5.6 in November 2016. Staging workup including a bone scan done on 05/15/2015 which showed a focus of increased uptake in the posterior parietal occipital region of the skull. There is also area of uptake likely degenerative in nature. CT scan of the abdomen and pelvis done on 05/15/2015 shows sclerotic osseous metastasis noted in multiple locations.  Current therapy:  Androgen deprivation utilizing Lupron 30 mg every 4 months his first injection received September 2017. This will be repeated every 4 months. Xtandi started on 06/10/2015.  Interim History: [name] presents today for a follow-up visit. Since the last visit, he reports no major changes in his health. He remains active and continues to attend activities of daily living. He works full time without any decline in his  ability to do so. He denied any recent breast tenderness or discomfort. He denied any nipple discharge or erythema. He did have breast tenderness previously and was evaluated with a mammography was unremarkable. He has a repeat ultrasound in the near future.  He continues on Xtandi without any recent complications. He denied any abdominal pain, nausea or less of appetite. He has not reported any neurological issues or seizures. He does report occasional hot flashes which have not changed dramatically.   He does not report any blurry vision, syncope or seizures. Does not report any fevers, chills, sweats or weight loss. Does not report any chest pain, palpitation, orthopnea or leg edema. Does not report any cough, hemoptysis or wheezing. Does not report any nausea, vomiting, abdominal pain. Does not report any hematochezia, melena or early satiety. Does not report any frequency urgency or hesitancy. Does not report any skeletal complaints. Remaining review of systems unremarkable.  Medications: I have reviewed the patient's current medications.  Current Outpatient Prescriptions  Medication Sig Dispense Refill  . calcium-vitamin D (CALCIUM 500+D) 500-200 MG-UNIT per tablet Take 2 tablets by mouth daily.    Gillermina Phy 40 MG capsule TAKE 4 CAPSULES (160 MG TOTAL) BY MOUTH ONCE DAILY AT THE SAME TIME. MAY TAKE WITH OR WITHOUT FOOD. SWALLOW WHOLE. 120 capsule 0  . atorvastatin (LIPITOR) 10 MG tablet Take 10 mg by mouth daily.     No current facility-administered medications for this visit.      Allergies:  Allergies  Allergen Reactions  . Lanolin Itching and Rash  Past Medical History, Surgical history, Social history, and Family History were reviewed and updated.   Physical Exam: Blood pressure (!) 170/93, pulse 71, temperature 98.1 F (36.7 C), temperature source Oral, resp. rate 19, weight 247 lb 4.8 oz (112.2 kg), SpO2 95 %. ECOG: 0 General appearance:  , Awake gentleman without  distress. Head: Normocephalic, without obvious abnormality no oral ulcers or lesions. Neck: no adenopathy Lymph nodes: Cervical, supraclavicular, and axillary nodes normal. Heart:regular rate and rhythm, S1, S2 normal, no murmur, click, rub or gallop Lung:chest clear, no wheezing, rales, normal symmetric air entry Abdomin: soft, non-tender, without masses or organomegaly no shifting dullness or ascites. Breast examination: Cannot palpate any masses or lesions bilaterally. No nipple discharge. No erythema or induration. No lymphadenopathy noted EXT:no erythema, induration, or nodules   Lab Results: Lab Results  Component Value Date   WBC 9.0 09/13/2016   HGB 15.2 09/13/2016   HCT 45.6 09/13/2016   MCV 82.6 09/13/2016   PLT 271 09/13/2016     Chemistry      Component Value Date/Time   NA 143 07/19/2016 1329   K 3.8 07/19/2016 1329   CL 109 08/05/2012 1524   CO2 22 07/19/2016 1329   BUN 18.6 07/19/2016 1329   CREATININE 1.1 07/19/2016 1329      Component Value Date/Time   CALCIUM 9.3 07/19/2016 1329   ALKPHOS 91 07/19/2016 1329   AST 11 07/19/2016 1329   ALT 10 07/19/2016 1329   BILITOT 0.46 07/19/2016 1329     Results for [name], Curtis (MRN [id number]) as of 09/13/2016 14:59  Ref. Range 11/10/2015 15:22 03/04/2016 15:14 05/05/2016 13:43 07/19/2016 13:29  PSA Latest Ref Range: 0.0 - 4.0 ng/mL 0.7 0.7 0.7 1.0       Impression and Plan:  56 year old gentleman with the following issues:  1. 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 His initial diagnosis was in 2012 where he had a Gleason score of 4+5 = 9 and a PSA of 16.6. He underwent a robotic prostatectomy and androgen deprivation. After a PSA nadir he had been on a slow rise and his most recent PSA was 5.6 in November 2016 with possible bony metastasis detected on his most recent imaging studies.   He started Xtandi in December 2016 and have tolerated it well. His  PSA continues to reasonably controlled and remains stable close to 1  I recommended continuing this medication for the time being and recommend repeating staging workup with CTs PSA starts to rise rapidly.  2. Androgen depravation: I have recommended androgen deprivation to be continued indefinitely. He received L[facility]ron at the [facility] starting in September of 2017. He will receive Lupron on 11/17/2016.  3. Bone directed therapy: He is a candidate for bone directed therapy. It would be reasonable to defer this to a later time.  4. Gynecomastia: Pain is improved since the last visit. Repeat ultrasound scheduled in the near future.  5. Family history of male breast cancer and personal history of prostate cancer: He have discussed the risks and benefits of genetic counseling I offered him a referral today. He will consider that and let me know in the near future.  6. Follow-up: Will be in 2 months.   Zola Button, MD 3/20/20183:14 PM

## 2016-09-13 NOTE — Telephone Encounter (Signed)
Gave patient AVS and calender per 3/20 los

## 2016-09-14 ENCOUNTER — Telehealth: Payer: Self-pay | Admitting: *Deleted

## 2016-09-14 LAB — PSA: Prostate Specific Ag, Serum: 1.4 ng/mL (ref 0.0–4.0)

## 2016-09-14 NOTE — Telephone Encounter (Signed)
This nurse tried calling patient but phone is not set up to receive voice mail messages. As noted below by Dr. Alen Blew, PSA is slightly up. There is no change in treatment for now. If patient calls, this message needs to be given to him.

## 2016-09-14 NOTE — Telephone Encounter (Signed)
-----   Message from [name], MD sent at 09/14/2016  9:27 AM EDT ----- Please let him know PSA up slightly. No change for now.

## 2016-09-20 ENCOUNTER — Encounter: Payer: Self-pay | Admitting: *Deleted

## 2016-10-24 ENCOUNTER — Other Ambulatory Visit: Payer: Self-pay | Admitting: Oncology

## 2016-10-24 DIAGNOSIS — C61 Malignant neoplasm of prostate: Secondary | ICD-10-CM

## 2016-11-17 ENCOUNTER — Ambulatory Visit (HOSPITAL_BASED_OUTPATIENT_CLINIC_OR_DEPARTMENT_OTHER): Payer: Managed Care, Other (non HMO)

## 2016-11-17 ENCOUNTER — Telehealth: Payer: Self-pay | Admitting: Oncology

## 2016-11-17 ENCOUNTER — Ambulatory Visit (HOSPITAL_BASED_OUTPATIENT_CLINIC_OR_DEPARTMENT_OTHER): Payer: Managed Care, Other (non HMO) | Admitting: Oncology

## 2016-11-17 ENCOUNTER — Other Ambulatory Visit (HOSPITAL_BASED_OUTPATIENT_CLINIC_OR_DEPARTMENT_OTHER): Payer: Managed Care, Other (non HMO)

## 2016-11-17 VITALS — BP 184/90 | HR 65 | Temp 98.6°F | Resp 18 | Ht 70.0 in | Wt 247.2 lb

## 2016-11-17 DIAGNOSIS — C61 Malignant neoplasm of prostate: Secondary | ICD-10-CM

## 2016-11-17 DIAGNOSIS — Z5111 Encounter for antineoplastic chemotherapy: Secondary | ICD-10-CM

## 2016-11-17 DIAGNOSIS — C775 Secondary and unspecified malignant neoplasm of intrapelvic lymph nodes: Secondary | ICD-10-CM

## 2016-11-17 DIAGNOSIS — C7951 Secondary malignant neoplasm of bone: Secondary | ICD-10-CM | POA: Diagnosis not present

## 2016-11-17 DIAGNOSIS — E291 Testicular hypofunction: Secondary | ICD-10-CM

## 2016-11-17 LAB — CBC WITH DIFFERENTIAL/PLATELET
BASO%: 1.1 % (ref 0.0–2.0)
BASOS ABS: 0.1 10*3/uL (ref 0.0–0.1)
EOS ABS: 0.1 10*3/uL (ref 0.0–0.5)
EOS%: 1.2 % (ref 0.0–7.0)
HEMATOCRIT: 46.4 % (ref 38.4–49.9)
HEMOGLOBIN: 15.4 g/dL (ref 13.0–17.1)
LYMPH#: 1.3 10*3/uL (ref 0.9–3.3)
LYMPH%: 16.4 % (ref 14.0–49.0)
MCH: 27.8 pg (ref 27.2–33.4)
MCHC: 33.3 g/dL (ref 32.0–36.0)
MCV: 83.5 fL (ref 79.3–98.0)
MONO#: 0.6 10*3/uL (ref 0.1–0.9)
MONO%: 6.9 % (ref 0.0–14.0)
NEUT#: 6.1 10*3/uL (ref 1.5–6.5)
NEUT%: 74.4 % (ref 39.0–75.0)
PLATELETS: 245 10*3/uL (ref 140–400)
RBC: 5.55 10*6/uL (ref 4.20–5.82)
RDW: 14.6 % (ref 11.0–14.6)
WBC: 8.2 10*3/uL (ref 4.0–10.3)

## 2016-11-17 LAB — COMPREHENSIVE METABOLIC PANEL
ALBUMIN: 3.8 g/dL (ref 3.5–5.0)
ALK PHOS: 90 U/L (ref 40–150)
ALT: 10 U/L (ref 0–55)
AST: 12 U/L (ref 5–34)
Anion Gap: 8 mEq/L (ref 3–11)
BUN: 19.7 mg/dL (ref 7.0–26.0)
CALCIUM: 9.2 mg/dL (ref 8.4–10.4)
CO2: 25 mEq/L (ref 22–29)
Chloride: 106 mEq/L (ref 98–109)
Creatinine: 1.1 mg/dL (ref 0.7–1.3)
EGFR: 71 mL/min/{1.73_m2} — AB (ref >90)
Glucose: 93 mg/dl (ref 70–140)
POTASSIUM: 4 meq/L (ref 3.5–5.1)
Sodium: 139 mEq/L (ref 136–145)
Total Bilirubin: 0.54 mg/dL (ref 0.20–1.20)
Total Protein: 7 g/dL (ref 6.4–8.3)

## 2016-11-17 MED ORDER — LEUPROLIDE ACETATE (4 MONTH) 30 MG IM KIT
30.0000 mg | PACK | Freq: Once | INTRAMUSCULAR | Status: AC
  Administered 2016-11-17: 30 mg via INTRAMUSCULAR
  Filled 2016-11-17: qty 30

## 2016-11-17 NOTE — Telephone Encounter (Signed)
Appointments scheduled per 11/17/16 los. Patient was given a copy of the AVS report and appointment schedule per 11/17/16 los. °

## 2016-11-17 NOTE — Patient Instructions (Signed)
Leuprolide depot injection What is this medicine? LEUPROLIDE (loo PROE lide) is a man-made protein that acts like a natural hormone in the body. It decreases testosterone in men and decreases estrogen in women. In men, this medicine is used to treat advanced prostate cancer. In women, some forms of this medicine may be used to treat endometriosis, uterine fibroids, or other male hormone-related problems. This medicine may be used for other purposes; ask your health care provider or pharmacist if you have questions. What should I tell my health care provider before I take this medicine? They need to know if you have any of these conditions: -diabetes -heart disease or previous heart attack -high blood pressure -high cholesterol -osteoporosis -pain or difficulty passing urine -spinal cord metastasis -stroke -tobacco smoker -unusual vaginal bleeding (women) -an unusual or allergic reaction to leuprolide, benzyl alcohol, other medicines, foods, dyes, or preservatives -pregnant or trying to get pregnant -breast-feeding How should I use this medicine? This medicine is for injection into a muscle or for injection under the skin. It is given by a health care professional in a hospital or clinic setting. The specific product will determine how it will be given to you. Make sure you understand which product you receive and how often you will receive it. Talk to your pediatrician regarding the use of this medicine in children. Special care may be needed. Overdosage: If you think you have taken too much of this medicine contact a poison control center or emergency room at once. NOTE: This medicine is only for you. Do not share this medicine with others. What if I miss a dose? It is important not to miss a dose. Call your doctor or health care professional if you are unable to keep an appointment. Depot injections: Depot injections are given either once-monthly, every 12 weeks, every 16 weeks, or  every 24 weeks depending on the product you are prescribed. The product you are prescribed will be based on if you are male or male, and your condition. Make sure you understand your product and dosing. What may interact with this medicine? Do not take this medicine with any of the following medications: -chasteberry This medicine may also interact with the following medications: -herbal or dietary supplements, like black cohosh or DHEA -male hormones, like estrogens or progestins and birth control pills, patches, rings, or injections -male hormones, like testosterone This list may not describe all possible interactions. Give your health care provider a list of all the medicines, herbs, non-prescription drugs, or dietary supplements you use. Also tell them if you smoke, drink alcohol, or use illegal drugs. Some items may interact with your medicine. What should I watch for while using this medicine? Visit your doctor or health care professional for regular checks on your progress. During the first weeks of treatment, your symptoms may get worse, but then will improve as you continue your treatment. You may get hot flashes, increased bone pain, increased difficulty passing urine, or an aggravation of nerve symptoms. Discuss these effects with your doctor or health care professional, some of them may improve with continued use of this medicine. Male patients may experience a menstrual cycle or spotting during the first months of therapy with this medicine. If this continues, contact your doctor or health care professional. What side effects may I notice from receiving this medicine? Side effects that you should report to your doctor or health care professional as soon as possible: -allergic reactions like skin rash, itching or hives, swelling of the   face, lips, or tongue -breathing problems -chest pain -depression or memory disorders -pain in your legs or groin -pain at site where injected or  implanted -severe headache -swelling of the feet and legs -visual changes -vomiting Side effects that usually do not require medical attention (report to your doctor or health care professional if they continue or are bothersome): -breast swelling or tenderness -decrease in sex drive or performance -diarrhea -hot flashes -loss of appetite -muscle, joint, or bone pains -nausea -redness or irritation at site where injected or implanted -skin problems or acne This list may not describe all possible side effects. Call your doctor for medical advice about side effects. You may report side effects to FDA at 1-800-FDA-1088. Where should I keep my medicine? This drug is given in a hospital or clinic and will not be stored at home. NOTE: This sheet is a summary. It may not cover all possible information. If you have questions about this medicine, talk to your doctor, pharmacist, or health care provider.    2016, [employer]vier/Gold Standard. (2014-03-07 14:16:23)  

## 2016-11-17 NOTE — Progress Notes (Signed)
Hematology and Oncology Follow Up Visit  [name] [id number] [birth year] 56 y.o. 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 His initial diagnosis was in 2012 where he had a Gleason score of 4+5 = 9 and a PSA of 16.6.   Prior Therapy:  He underwent a radical prostatectomy on 04/18/2011 under the care of Dr. Alinda Money. The final pathology showed to have prostate adenocarcinoma with a Gleason score of 4+5 = 9 with tumor involving both lobes. Extraprostatic extension was noted with bilateral regional lymph node were involved with malignancy. He had 5 of 6 lymph nodes noted in the right regional lymph node basin. He also had 4 out of 5 lymph nodes noted on the left. The final pathological staging was T3b N1 with 9 positive lymph nodes out of 11 examined.  He continued to have persistent PSA and was treated with androgen deprivation. His PSA became undetectable until August 2014. In September 2015 PSA increased up to 1.29 and Casodex was added around that time.  In June 2016 PSA went up to 2.0 and subsequently to 5.6 in November 2016. Staging workup including a bone scan done on 05/15/2015 which showed a focus of increased uptake in the posterior parietal occipital region of the skull. There is also area of uptake likely degenerative in nature. CT scan of the abdomen and pelvis done on 05/15/2015 shows sclerotic osseous metastasis noted in multiple locations.  Current therapy:  Androgen deprivation utilizing Lupron 30 mg every 4 months his first injection received September 2017. This will be repeated every 4 months. Xtandi started on 06/10/2015.  Interim History: [name] presents today for a follow-up visit. Since the last visit, he reports some mild fatigue which has not changed dramatically. He remains active and continues to attend activities of daily living. He works full time 6 days a  week without decline in ability to do so. He continues on Xtandi without any recent complications. He denied any abdominal pain, nausea or less of appetite. He has not reported any neurological issues or seizures. He does report occasional hot flashes which have not changed dramatically. He denied any pathological fractures or bone pain. His quality of life and performance status remains unchanged.   He does not report any blurry vision, syncope or seizures. Does not report any fevers, chills, sweats or weight loss. Does not report any chest pain, palpitation, orthopnea or leg edema. Does not report any cough, hemoptysis or wheezing. Does not report any nausea, vomiting, abdominal pain. Does not report any hematochezia, melena or early satiety. Does not report any frequency urgency or hesitancy. Does not report any skeletal complaints. Remaining review of systems unremarkable.  Medications: I have reviewed the patient's current medications.  Current Outpatient Prescriptions  Medication Sig Dispense Refill  . calcium-vitamin D (CALCIUM 500+D) 500-200 MG-UNIT per tablet Take 2 tablets by mouth daily.    [name] 40 MG capsule TAKE 4 CAPSULES (160 MG TOTAL) BY MOUTH ONCE DAILY AT THE SAME TIME. MAY TAKE WITH OR WITHOUT FOOD. SWALLOW WHOLE. 120 capsule 0   No current facility-administered medications for this visit.    Facility-Administered Medications Ordered in Other Visits  Medication Dose Route Frequency Provider Last Rate Last Dose  . leuprolide (LUPRON) injection 30 mg  30 mg Intramuscular Once [name], MD         Allergies:  Allergies  Allergen Reactions  . Lanolin  Itching and Rash    Past Medical History, Surgical history, Social history, and Family History were reviewed and updated.   Physical Exam: Blood pressure (!) 184/90, pulse 65, temperature 98.6 F (37 C), temperature source Oral, resp. rate 18, height 5\' 10"  (1.778 m), weight 247 lb 3.2 oz (112.1 kg), SpO2 98  %. ECOG: 0 General appearance:  Well-appearing gentleman without distress. Head: Normocephalic, without obvious abnormality no oral thrush or ulcers. Neck: no adenopathy Lymph nodes: Cervical, supraclavicular, and axillary nodes normal. Heart:regular rate and rhythm, S1, S2 normal, no murmur, click, rub or gallop Lung:chest clear, no wheezing, rales, normal symmetric air entry Abdomin: soft, non-tender, without masses or organomegaly no shifting dullness or ascites. Skin: No rashes or lesions. EXT:no erythema, induration, or nodules   Lab Results: Lab Results  Component Value Date   WBC 8.2 11/17/2016   HGB 15.4 11/17/2016   HCT 46.4 11/17/2016   MCV 83.5 11/17/2016   PLT 245 11/17/2016     Chemistry      Component Value Date/Time   NA 143 09/13/2016 1431   K 4.3 09/13/2016 1431   CL 109 08/05/2012 1524   CO2 26 09/13/2016 1431   BUN 21.5 09/13/2016 1431   CREATININE 1.4 (H) 09/13/2016 1431      Component Value Date/Time   CALCIUM 9.3 09/13/2016 1431   ALKPHOS 87 09/13/2016 1431   AST 12 09/13/2016 1431   ALT 13 09/13/2016 1431   BILITOT 0.38 09/13/2016 1431       Results for [name] (MRN [id number]) as of 11/17/2016 15:24  Ref. Range 05/05/2016 13:43 07/19/2016 13:29 09/13/2016 14:31  PSA Latest Ref Range: 0.0 - 4.0 ng/mL 0.7 1.0 1.4      Impression and Plan:  56 year old gentleman with the following issues:  1. 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 His initial diagnosis was in 2012 where he had a Gleason score of 4+5 = 9 and a PSA of 16.6. He underwent a robotic prostatectomy and androgen deprivation. After a PSA nadir he had been on a slow rise and his most recent PSA was 5.6 in November 2016 with possible bony metastasis detected on his most recent imaging studies.   He started Xtandi in December 2016 and have tolerated it well.   His PSA reviewed over the last few months with a slow rise in  his PSA with doubling time close to 6 months.  Risks and benefits of continuing this medication was reviewed today. Alternative therapies were also reviewed including Zytiga and chemotherapy. For the time being and the plan is to continue the same dose and schedule and stage him with CT scan or bone scan if he develops rapid rise in his PSA.  2. Androgen depravation: I have recommended androgen deprivation to be continued indefinitely. He received Lupron at the [facility] starting in September of 2017. He will receive Lupron on 11/17/2016. This will be repeated in 4 months.  3. Bone directed therapy: He is a candidate for bone directed therapy. It would be reasonable to defer this to a later time.  4. Gynecomastia: Pain is improved since the last visit.   5. Family history of male breast cancer and personal history of prostate cancer: I have discussed the risks and benefits of genetic counseling in the past and we'll consider that option in the future.  6. Follow-up: Will be in 2 months.   Zola Button, MD 5/24/20183:33 PM

## 2016-11-18 ENCOUNTER — Telehealth: Payer: Self-pay | Admitting: *Deleted

## 2016-11-18 LAB — PSA: PROSTATE SPECIFIC AG, SERUM: 2.5 ng/mL (ref 0.0–4.0)

## 2016-11-18 NOTE — Telephone Encounter (Signed)
-----   Message from [name], MD sent at 11/18/2016  8:46 AM EDT ----- Please let him know his PSA is up slightly. No changes for now.

## 2016-11-18 NOTE — Telephone Encounter (Signed)
This RN called patient to inform him of his PSA level but his cell phone is not set up to receive voicemail. This message needs to be given to him if he calls. Per Dr. Alen Blew, there are no changes for now.

## 2016-11-25 ENCOUNTER — Other Ambulatory Visit: Payer: Self-pay | Admitting: Oncology

## 2016-11-25 ENCOUNTER — Telehealth: Payer: Self-pay | Admitting: *Deleted

## 2016-11-25 DIAGNOSIS — C61 Malignant neoplasm of prostate: Secondary | ICD-10-CM

## 2016-11-25 NOTE — Telephone Encounter (Signed)
Gave wife PSA number. Did not give any interpretation.

## 2016-11-25 NOTE — Telephone Encounter (Signed)
This nurse tried calling the wife back regarding PSA results. Per wife, I called [phone], and the phone is not set up to receive voice mail messages. This is the second attempt to give patient results.

## 2017-01-19 ENCOUNTER — Telehealth: Payer: Self-pay | Admitting: Oncology

## 2017-01-19 ENCOUNTER — Ambulatory Visit (HOSPITAL_BASED_OUTPATIENT_CLINIC_OR_DEPARTMENT_OTHER): Payer: 59 | Admitting: Oncology

## 2017-01-19 ENCOUNTER — Other Ambulatory Visit (HOSPITAL_BASED_OUTPATIENT_CLINIC_OR_DEPARTMENT_OTHER): Payer: 59

## 2017-01-19 VITALS — BP 170/79 | HR 65 | Temp 99.0°F | Resp 19 | Ht 70.0 in | Wt 251.3 lb

## 2017-01-19 DIAGNOSIS — C61 Malignant neoplasm of prostate: Secondary | ICD-10-CM | POA: Diagnosis not present

## 2017-01-19 DIAGNOSIS — C7951 Secondary malignant neoplasm of bone: Secondary | ICD-10-CM

## 2017-01-19 LAB — COMPREHENSIVE METABOLIC PANEL
ALBUMIN: 3.7 g/dL (ref 3.5–5.0)
ALK PHOS: 90 U/L (ref 40–150)
ALT: 12 U/L (ref 0–55)
AST: 14 U/L (ref 5–34)
Anion Gap: 9 mEq/L (ref 3–11)
BILIRUBIN TOTAL: 0.55 mg/dL (ref 0.20–1.20)
BUN: 17.8 mg/dL (ref 7.0–26.0)
CALCIUM: 9.1 mg/dL (ref 8.4–10.4)
CO2: 25 mEq/L (ref 22–29)
CREATININE: 1.1 mg/dL (ref 0.7–1.3)
Chloride: 107 mEq/L (ref 98–109)
EGFR: 75 mL/min/{1.73_m2} — ABNORMAL LOW (ref >90)
Glucose: 86 mg/dl (ref 70–140)
Potassium: 3.7 mEq/L (ref 3.5–5.1)
Sodium: 141 mEq/L (ref 136–145)
Total Protein: 6.9 g/dL (ref 6.4–8.3)

## 2017-01-19 LAB — CBC WITH DIFFERENTIAL/PLATELET
BASO%: 0.3 % (ref 0.0–2.0)
BASOS ABS: 0 10*3/uL (ref 0.0–0.1)
EOS%: 1.1 % (ref 0.0–7.0)
Eosinophils Absolute: 0.1 10*3/uL (ref 0.0–0.5)
HEMATOCRIT: 44.6 % (ref 38.4–49.9)
HEMOGLOBIN: 14.8 g/dL (ref 13.0–17.1)
LYMPH#: 1.2 10*3/uL (ref 0.9–3.3)
LYMPH%: 15.4 % (ref 14.0–49.0)
MCH: 28.5 pg (ref 27.2–33.4)
MCHC: 33.2 g/dL (ref 32.0–36.0)
MCV: 85.8 fL (ref 79.3–98.0)
MONO#: 0.7 10*3/uL (ref 0.1–0.9)
MONO%: 8.9 % (ref 0.0–14.0)
NEUT#: 6 10*3/uL (ref 1.5–6.5)
NEUT%: 74.3 % (ref 39.0–75.0)
Platelets: 236 10*3/uL (ref 140–400)
RBC: 5.2 10*6/uL (ref 4.20–5.82)
RDW: 13.9 % (ref 11.0–14.6)
WBC: 8 10*3/uL (ref 4.0–10.3)

## 2017-01-19 NOTE — Telephone Encounter (Signed)
Gave patient avs report and appointments for September. Central radiology will call re scans.  °

## 2017-01-19 NOTE — Progress Notes (Signed)
Hematology and Oncology Follow Up Visit  [name] [id number] [birth year] 56 y.o. 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 His initial diagnosis was in 2012 where he had a Gleason score of 4+5 = 9 and a PSA of 16.6.   Prior Therapy:  He underwent a radical prostatectomy on 04/18/2011 under the care of Dr. Alinda [name]. The final pathology showed to have prostate adenocarcinoma with a Gleason score of 4+5 = 9 with tumor involving both lobes. Extraprostatic extension was noted with bilateral regional lymph node were involved with malignancy. He had 5 of 6 lymph nodes noted in the right regional lymph node basin. He also had 4 out of 5 lymph nodes noted on the left. The final pathological staging was T3b N1 with 9 positive lymph nodes out of 11 examined.  He continued to have persistent PSA and was treated with androgen deprivation. His PSA became undetectable until August 2014. In September 2015 PSA increased up to 1.29 and Casodex was added around that time.  In June 2016 PSA went up to 2.0 and subsequently to 5.6 in November 2016. Staging workup including a bone scan done on 05/15/2015 which showed a focus of increased uptake in the posterior parietal occipital region of the skull. There is also area of uptake likely degenerative in nature. CT scan of the abdomen and pelvis done on 05/15/2015 shows sclerotic osseous metastasis noted in multiple locations.  Current therapy:  Androgen deprivation utilizing Lupron 30 mg every 4 months his first injection received September 2017. This will be repeated every 4 months. Xtandi started on 06/10/2015.  Interim History: [name] presents today for a follow-up visit. Since the last visit, he reports no changes in his health. He continues to be active and works full time without any decline in his ability to do so. He continues on Xtandi without any  new side effects. He denied any abdominal pain, nausea or less of appetite. He has not reported any neurological issues or seizures. He does report occasional hot flashes which have not changed dramatically. He denied any pathological fractures or bone pain. He denied any recent hospitalization or illnesses.   He does not report any blurry vision, syncope or seizures. Does not report any fevers, chills, sweats or weight loss. Does not report any chest pain, palpitation, orthopnea or leg edema. Does not report any cough, hemoptysis or wheezing. Does not report any nausea, vomiting, abdominal pain. Does not report any hematochezia, melena or early satiety. Does not report any frequency urgency or hesitancy. Does not report any skeletal complaints. Remaining review of systems unremarkable.  Medications: I have reviewed the patient's current medications.  Current Outpatient Prescriptions  Medication Sig Dispense Refill  . calcium-vitamin D (CALCIUM 500+D) 500-200 MG-UNIT per tablet Take 2 tablets by mouth daily.    [name] 40 MG capsule TAKE 4 CAPSULES (160 MG TOTAL) BY MOUTH ONCE DAILY AT THE SAME TIME. MAY TAKE WITH OR WITHOUT FOOD. SWALLOW WHOLE. 120 capsule 0   No current facility-administered medications for this visit.      Allergies:  Allergies  Allergen Reactions  . Lanolin Itching and Rash    Past Medical History, Surgical history, Social history, and Family History were reviewed and updated.   Physical Exam: Blood pressure (!) 170/79, pulse 65, temperature 99 F (37.2 C), temperature source Oral, resp. rate 19, height 5\' 10"  (1.778 m), weight 251 lb 4.8  oz (114 kg), SpO2 97 %. ECOG: 0 General appearance: Alert, awake gentleman without distress. Head: Normocephalic, without obvious abnormality sclerae anicteric. Neck: no adenopathy masses or lesions. Lymph nodes: Cervical, supraclavicular, and axillary nodes normal. Heart:regular rate and rhythm, S1, S2 normal, no murmur, click,  rub or gallop Lung:chest clear, no wheezing, rales, normal symmetric air entry Abdomin: soft, non-tender, without masses or organomegaly no rebound or guarding. Skin: No rashes or lesions. EXT:no erythema, induration, or nodules   Lab Results: Lab Results  Component Value Date   WBC 8.0 01/19/2017   HGB 14.8 01/19/2017   HCT 44.6 01/19/2017   MCV 85.8 01/19/2017   PLT 236 01/19/2017     Chemistry      Component Value Date/Time   NA 139 11/17/2016 1506   K 4.0 11/17/2016 1506   CL 109 08/05/2012 1524   CO2 25 11/17/2016 1506   BUN 19.7 11/17/2016 1506   CREATININE 1.1 11/17/2016 1506      Component Value Date/Time   CALCIUM 9.2 11/17/2016 1506   ALKPHOS 90 11/17/2016 1506   AST 12 11/17/2016 1506   ALT 10 11/17/2016 1506   BILITOT 0.54 11/17/2016 1506      Results for [name] (MRN [id number]) as of 01/19/2017 14:41  Ref. Range 09/13/2016 14:31 11/17/2016 15:06  Prostate Specific Ag, Serum Latest Ref Range: 0.0 - 4.0 ng/mL 1.4 2.5       Impression and Plan:  56 year old gentleman with the following issues:  1. 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 His initial diagnosis was in 2012 where he had a Gleason score of 4+5 = 9 and a PSA of 16.6. He underwent a robotic prostatectomy and androgen deprivation. After a PSA nadir he had been on a slow rise and his most recent PSA was 5.6 in November 2016 with possible bony metastasis detected on his most recent imaging studies.   He started Xtandi in December 2016 and have tolerated it well.   His PSA gas been rising as of late and potentially developing progression of disease. The plan is to restage him with CT scan and bone scan and consider salvage therapy if he has progression of disease. For the time being, he will continue next and he.  2. Androgen depravation: I have recommended androgen deprivation to be continued indefinitely. He received Lupron at the  [facility] starting in September of 2017. He will receive Lupron on 11/17/2016. This will be repeated in September 2018.  3. Bone directed therapy: He is a candidate for bone directed therapy. It would be reasonable to defer this to a later time.  4. Gynecomastia: This has resolved at this time.  5. Family history of male breast cancer and personal history of prostate cancer: I have discussed the risks and benefits of genetic counseling in the past and we'll consider that option in the future.  6. Follow-up: Will be in 2 months.   Zola Button, MD 7/26/20182:53 PM

## 2017-01-20 ENCOUNTER — Telehealth: Payer: Self-pay | Admitting: *Deleted

## 2017-01-20 LAB — TESTOSTERONE: TESTOSTERONE: 32 ng/dL — AB (ref 264–916)

## 2017-01-20 LAB — PSA: Prostate Specific Ag, Serum: 4.7 ng/mL — ABNORMAL HIGH (ref 0.0–4.0)

## 2017-01-20 NOTE — Telephone Encounter (Signed)
-----   Message from [name], MD sent at 01/20/2017  8:57 AM EDT ----- Please let him know PSA is up. No change for now.

## 2017-01-20 NOTE — Telephone Encounter (Signed)
As noted below by Dr. Alen Blew, I informed Vaughan Basta, wife, of the PSA result. She verbalized understanding.

## 2017-02-10 ENCOUNTER — Other Ambulatory Visit: Payer: Self-pay | Admitting: Oncology

## 2017-02-10 DIAGNOSIS — C61 Malignant neoplasm of prostate: Secondary | ICD-10-CM

## 2017-03-21 ENCOUNTER — Other Ambulatory Visit (HOSPITAL_BASED_OUTPATIENT_CLINIC_OR_DEPARTMENT_OTHER): Payer: 59

## 2017-03-21 ENCOUNTER — Encounter (HOSPITAL_COMMUNITY): Payer: Self-pay

## 2017-03-21 ENCOUNTER — Ambulatory Visit (HOSPITAL_COMMUNITY)
Admission: RE | Admit: 2017-03-21 | Discharge: 2017-03-21 | Disposition: A | Discharge status: Home / Self Care | Payer: 59 | Source: Ambulatory Visit | Attending: Oncology | Admitting: Oncology

## 2017-03-21 ENCOUNTER — Encounter (HOSPITAL_COMMUNITY)
Admission: RE | Admit: 2017-03-21 | Discharge: 2017-03-21 | Disposition: A | Discharge status: Home / Self Care | Payer: 59 | Source: Ambulatory Visit | Attending: Oncology | Admitting: Oncology

## 2017-03-21 DIAGNOSIS — C61 Malignant neoplasm of prostate: Secondary | ICD-10-CM

## 2017-03-21 DIAGNOSIS — N200 Calculus of kidney: Secondary | ICD-10-CM | POA: Insufficient documentation

## 2017-03-21 DIAGNOSIS — C7951 Secondary malignant neoplasm of bone: Secondary | ICD-10-CM | POA: Insufficient documentation

## 2017-03-21 LAB — CBC WITH DIFFERENTIAL/PLATELET
BASO%: 1 % (ref 0.0–2.0)
BASOS ABS: 0.1 10*3/uL (ref 0.0–0.1)
EOS%: 1.3 % (ref 0.0–7.0)
Eosinophils Absolute: 0.1 10*3/uL (ref 0.0–0.5)
HEMATOCRIT: 50.1 % — AB (ref 38.4–49.9)
HGB: 16.7 g/dL (ref 13.0–17.1)
LYMPH#: 1.3 10*3/uL (ref 0.9–3.3)
LYMPH%: 14.6 % (ref 14.0–49.0)
MCH: 28.2 pg (ref 27.2–33.4)
MCHC: 33.3 g/dL (ref 32.0–36.0)
MCV: 84.6 fL (ref 79.3–98.0)
MONO#: 0.7 10*3/uL (ref 0.1–0.9)
MONO%: 7.3 % (ref 0.0–14.0)
NEUT#: 6.9 10*3/uL — ABNORMAL HIGH (ref 1.5–6.5)
NEUT%: 75.8 % — AB (ref 39.0–75.0)
Platelets: 250 10*3/uL (ref 140–400)
RBC: 5.92 10*6/uL — ABNORMAL HIGH (ref 4.20–5.82)
RDW: 14.3 % (ref 11.0–14.6)
WBC: 9.1 10*3/uL (ref 4.0–10.3)

## 2017-03-21 LAB — COMPREHENSIVE METABOLIC PANEL
ALT: 9 U/L (ref 0–55)
AST: 11 U/L (ref 5–34)
Albumin: 3.7 g/dL (ref 3.5–5.0)
Alkaline Phosphatase: 97 U/L (ref 40–150)
Anion Gap: 5 mEq/L (ref 3–11)
BUN: 12.9 mg/dL (ref 7.0–26.0)
CALCIUM: 9.9 mg/dL (ref 8.4–10.4)
CHLORIDE: 103 meq/L (ref 98–109)
CO2: 31 mEq/L — ABNORMAL HIGH (ref 22–29)
CREATININE: 1 mg/dL (ref 0.7–1.3)
EGFR: 86 mL/min/{1.73_m2} — ABNORMAL LOW (ref >90)
GLUCOSE: 91 mg/dL (ref 70–140)
POTASSIUM: 4.1 meq/L (ref 3.5–5.1)
SODIUM: 140 meq/L (ref 136–145)
Total Bilirubin: 0.67 mg/dL (ref 0.20–1.20)
Total Protein: 7.4 g/dL (ref 6.4–8.3)

## 2017-03-21 MED ORDER — TECHNETIUM TC 99M MEDRONATE IV KIT
25.0000 | PACK | Freq: Once | INTRAVENOUS | Status: AC | PRN
  Administered 2017-03-21: 21 via INTRAVENOUS

## 2017-03-21 MED ORDER — IOPAMIDOL (ISOVUE-300) INJECTION 61%
100.0000 mL | Freq: Once | INTRAVENOUS | Status: AC | PRN
  Administered 2017-03-21: 100 mL via INTRAVENOUS

## 2017-03-21 MED ORDER — IOPAMIDOL (ISOVUE-300) INJECTION 61%
INTRAVENOUS | Status: AC
  Filled 2017-03-21: qty 100

## 2017-03-22 ENCOUNTER — Telehealth: Payer: Self-pay

## 2017-03-22 ENCOUNTER — Encounter: Payer: Self-pay | Admitting: *Deleted

## 2017-03-22 ENCOUNTER — Other Ambulatory Visit: Payer: Self-pay | Admitting: *Deleted

## 2017-03-22 DIAGNOSIS — C61 Malignant neoplasm of prostate: Secondary | ICD-10-CM

## 2017-03-22 NOTE — Telephone Encounter (Signed)
Unable to leave a message for the patient concering added lab. Per 9/25 messages

## 2017-03-23 ENCOUNTER — Ambulatory Visit (HOSPITAL_BASED_OUTPATIENT_CLINIC_OR_DEPARTMENT_OTHER): Payer: 59

## 2017-03-23 ENCOUNTER — Telehealth: Payer: Self-pay | Admitting: Pharmacy Technician

## 2017-03-23 ENCOUNTER — Other Ambulatory Visit: Payer: 59

## 2017-03-23 ENCOUNTER — Telehealth: Payer: Self-pay | Admitting: Oncology

## 2017-03-23 ENCOUNTER — Ambulatory Visit (HOSPITAL_BASED_OUTPATIENT_CLINIC_OR_DEPARTMENT_OTHER): Payer: 59 | Admitting: Oncology

## 2017-03-23 VITALS — BP 181/87 | HR 66 | Temp 98.1°F | Resp 19 | Ht 70.0 in | Wt 256.8 lb

## 2017-03-23 DIAGNOSIS — E291 Testicular hypofunction: Secondary | ICD-10-CM

## 2017-03-23 DIAGNOSIS — C775 Secondary and unspecified malignant neoplasm of intrapelvic lymph nodes: Secondary | ICD-10-CM

## 2017-03-23 DIAGNOSIS — C61 Malignant neoplasm of prostate: Secondary | ICD-10-CM

## 2017-03-23 DIAGNOSIS — C7951 Secondary malignant neoplasm of bone: Secondary | ICD-10-CM

## 2017-03-23 DIAGNOSIS — Z5111 Encounter for antineoplastic chemotherapy: Secondary | ICD-10-CM

## 2017-03-23 MED ORDER — LEUPROLIDE ACETATE (4 MONTH) 30 MG IM KIT
30.0000 mg | PACK | Freq: Once | INTRAMUSCULAR | Status: AC
  Administered 2017-03-23: 30 mg via INTRAMUSCULAR
  Filled 2017-03-23: qty 30

## 2017-03-23 MED ORDER — PREDNISONE 5 MG PO TABS: 5.0000 mg | ORAL_TABLET | Freq: Every day | ORAL | 0 refills | Status: DC

## 2017-03-23 MED ORDER — ABIRATERONE ACETATE 250 MG PO TABS: 1000.0000 mg | ORAL_TABLET | Freq: Every day | ORAL | 0 refills | Status: DC

## 2017-03-23 NOTE — Telephone Encounter (Signed)
Oral Oncology Patient Advocate Encounter  Received notification from [place] that prior authorization for [name] is required.  PA submitted on CoverMyMeds Key [id number] Status is pending  Oral Oncology Clinic will continue to follow.  [name]. Melynda Keller, [place] Patient [place] [phone] 03/23/2017 4:03 PM

## 2017-03-23 NOTE — Progress Notes (Signed)
Hematology and Oncology Follow Up Visit  [name] [id number] March 28, 1961 56 y.o. 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 His initial diagnosis was in 2012 where he had a Gleason score of 4+5 = 9 and a PSA of 16.6.   Prior Therapy:  He underwent a radical prostatectomy on 04/18/2011 under the care of Dr. Alinda Money. The final pathology showed to have prostate adenocarcinoma with a Gleason score of 4+5 = 9 with tumor involving both lobes. Extraprostatic extension was noted with bilateral regional lymph node were involved with malignancy. He had 5 of 6 lymph nodes noted in the right regional lymph node basin. He also had 4 out of 5 lymph nodes noted on the left. The final pathological staging was T3b N1 with 9 positive lymph nodes out of 11 examined.  He continued to have persistent PSA and was treated with androgen deprivation. His PSA became undetectable until August 2014. In September 2015 PSA increased up to 1.29 and Casodex was added around that time.  In June 2016 PSA went up to 2.0 and subsequently to 5.6 in November 2016. Staging workup including a bone scan done on 05/15/2015 which showed a focus of increased uptake in the posterior parietal occipital region of the skull. There is also area of uptake likely degenerative in nature. CT scan of the abdomen and pelvis done on 05/15/2015 shows sclerotic osseous metastasis noted in multiple locations.  Current therapy:  Androgen deprivation utilizing Lupron 30 mg every 4 months his first injection received September 2017. This will be repeated every 4 months. Xtandi started on 06/10/2015.  Interim History: [name] presents today for a follow-up visit. Since the last visit, he reports feeling well without any complaints. He continues on Xtandi without any new side effects. He does report increased fatigue but still able to perform  activities of daily living. He denied any abdominal pain, nausea or less of appetite. He has not reported any neurological issues or seizures. He does report occasional hot flashes which have not changed dramatically. He denied any pathological fractures or bone pain. He denied any other pain.   He does not report any blurry vision, syncope or seizures. Does not report any fevers, chills, sweats or weight loss. Does not report any chest pain, palpitation, orthopnea or leg edema. Does not report any cough, hemoptysis or wheezing. Does not report any nausea, vomiting, abdominal pain. Does not report any hematochezia, melena or early satiety. Does not report any frequency urgency or hesitancy. Does not report any skeletal complaints. Remaining review of systems unremarkable.  Medications: I have reviewed the patient's current medications.  Current Outpatient Prescriptions  Medication Sig Dispense Refill  . calcium-vitamin D (CALCIUM 500+D) 500-200 MG-UNIT per tablet Take 2 tablets by mouth daily.    Marland Kitchen abiraterone Acetate (ZYTIGA) 250 MG tablet Take 4 tablets (1,000 mg total) by mouth daily. Take on an empty stomach 1 hour before or 2 hours after a meal 120 tablet 0  . predniSONE (DELTASONE) 5 MG tablet Take 1 tablet (5 mg total) by mouth daily with breakfast. 30 tablet 0   No current facility-administered medications for this visit.      Allergies:  Allergies  Allergen Reactions  . Lanolin Itching and Rash    Past Medical History, Surgical history, Social history, and Family History were reviewed and updated.   Physical Exam: Blood pressure (!) 181/87, pulse 66, temperature 98.1  F (36.7 C), temperature source Oral, resp. rate 19, height 5\' 10"  (1.778 m), weight 256 lb 12.8 oz (116.5 kg), SpO2 99 %. ECOG: 0 General appearance: Well-appearing gentleman without distress. Head: Normocephalic, without obvious abnormality. No oral ulcers or lesions. Neck: no adenopathy masses or  lesions. Lymph nodes: Cervical, supraclavicular, and axillary nodes normal. Heart:regular rate and rhythm, S1, S2 normal, no murmur, click, rub or gallop Lung:chest clear, no wheezing, rales, normal symmetric air entry Abdomin: soft, non-tender, without masses or organomegaly no shifting dullness or ascites. Skin: No rashes or lesions. EXT:no erythema, induration, or nodules   Lab Results: Lab Results  Component Value Date   WBC 9.1 03/21/2017   HGB 16.7 03/21/2017   HCT 50.1 (H) 03/21/2017   MCV 84.6 03/21/2017   PLT 250 03/21/2017     Chemistry      Component Value Date/Time   NA 140 03/21/2017 1117   K 4.1 03/21/2017 1117   CL 109 08/05/2012 1524   CO2 31 (H) 03/21/2017 1117   BUN 12.9 03/21/2017 1117   CREATININE 1.0 03/21/2017 1117      Component Value Date/Time   CALCIUM 9.9 03/21/2017 1117   ALKPHOS 97 03/21/2017 1117   AST 11 03/21/2017 1117   ALT 9 03/21/2017 1117   BILITOT 0.67 03/21/2017 1117      Results for [name], [name] (MRN [id number]) as of 03/23/2017 14:36  Ref. Range 09/13/2016 14:31 11/17/2016 15:06 01/19/2017 14:23  Prostate Specific Ag, Serum Latest Ref Range: 0.0 - 4.0 ng/mL 1.4 2.5 4.7 (H)    EXAM: CT ABDOMEN AND PELVIS WITH CONTRAST  TECHNIQUE: Multidetector CT imaging of the abdomen and pelvis was performed using the standard protocol following bolus administration of intravenous contrast.  CONTRAST:  161mL ISOVUE-300 IOPAMIDOL (ISOVUE-300) INJECTION 61%  COMPARISON:  05/15/2015  FINDINGS: Lower Chest: No acute findings.  Hepatobiliary: No hepatic masses identified. Gallbladder is unremarkable.  Pancreas:  No mass or inflammatory changes.  Spleen: Within normal limits in size and appearance.  Adrenals/Urinary Tract: No masses identified. Less than 1 cm bilateral renal calculi are again seen, as well as tiny right renal cysts. No evidence of ureteral calculi or hydronephrosis.  Stomach/Bowel: No evidence of obstruction,  inflammatory process or abnormal fluid collections. Mild rectal wall thickening is unchanged and likely due to postradiation changes.  Vascular/Lymphatic: No pathologically enlarged lymph nodes. No abdominal aortic aneurysm. Aortic atherosclerosis.  Reproductive: Prior prostatectomy. No soft tissue mass seen in prostatectomy bed.  Other:  None.  Musculoskeletal: Numerous small sclerotic bone lesions are seen throughout the visualized skeleton. Sclerotic lesions in the right anterior sixth rib, T8 vertebral body, right posterior sacrum and bilateral posterior iliac bones show increased in size, consistent with mild progression since prior study.  IMPRESSION: Mild interval progression of sclerotic bone metastases.  No evidence of soft tissue metastatic disease.  Stable nonobstructing bilateral renal calculi.     Impression and Plan:  56 year old gentleman with the following issues:  1. 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 His initial diagnosis was in 2012 where he had a Gleason score of 4+5 = 9 and a PSA of 16.6. He underwent a robotic prostatectomy and androgen deprivation. After a PSA nadir he had been on a slow rise and his most recent PSA was 5.6 in November 2016 with possible bony metastasis detected on his most recent imaging studies.   He started Xtandi in December 2016 and have tolerated it well.   His PSA  continues to rise after an initial excellent response. In July 2018 his PSA was 4.7.  CT scan and bone scan obtained on 03/21/2017 were personally reviewed and discussed with the patient and his wife. These imaging studies showed mild progression of disease specifically in the bone. There is no visceral metastasis noted.  Options of therapy were reviewed today given his progression of disease. I have recommended discontinuation of Xtandi and switching to different salvage therapy. These options  would include Zytiga, systemic chemotherapy or Xofigo. The rationale for using all these lesions were reviewed today and he would like to start Zytiga. Complications associated with this medication include fatigue, nausea, edema, hypertension and hypokalemia. He understands he might experience less benefit with this medication given his progression on Xtandi  He was started on 1000 mg of Zytiga with prednisone 5 mg daily.  2. Androgen depravation: I have recommended androgen deprivation to be continued indefinitely. He received Lupron at the [facility] starting in September of 2017. He will receive Lupron on 03/23/2017 and repeated in 4 months.  3. Bone directed therapy: He is a candidate for bone directed therapy. It would be reasonable to defer this to a later time.  4. Gynecomastia: This has resolved at this time.  5. Family history of male breast cancer and personal history of prostate cancer: I have discussed the risks and benefits of genetic counseling in the past and we'll consider that option in the future.  6. Follow-up: Will be in one month to follow his progress and assess for any complications related to Zytiga.   Zola Button, MD 9/27/20182:34 PM

## 2017-03-23 NOTE — Telephone Encounter (Signed)
Gave patient AVS and calendar of upcoming November appointments.  °

## 2017-03-24 ENCOUNTER — Telehealth: Payer: Self-pay | Admitting: *Deleted

## 2017-03-24 ENCOUNTER — Telehealth: Payer: Self-pay | Admitting: Pharmacist

## 2017-03-24 DIAGNOSIS — C61 Malignant neoplasm of prostate: Secondary | ICD-10-CM

## 2017-03-24 LAB — PSA: PROSTATE SPECIFIC AG, SERUM: 10.3 ng/mL — AB (ref 0.0–4.0)

## 2017-03-24 MED ORDER — ABIRATERONE ACETATE 250 MG PO TABS: 1000.0000 mg | ORAL_TABLET | Freq: Every day | ORAL | 0 refills | Status: DC

## 2017-03-24 NOTE — Telephone Encounter (Signed)
Spoke with wife linda. Gave results of last PSA.

## 2017-03-24 NOTE — Telephone Encounter (Signed)
Oral Oncology Pharmacist Encounter  Received new prescription for Zytiga (abiraterone) for the treatment of castration resistant metastatic prostate cancer in conjunction with prednisone, planned duration until disease progression or unacceptable drug toxicity. Patient was previously on Xtandi (started 06/10/2015) but is now showing some mild progression. Patient with switch therapy to Zytiga  CMP from 03/21/17 and BP from 03/23/17 assessed, no relevant lab abnormalities but the patients BP is uncontrolled. Will address with MD. Prescription dose and frequency assessed.   Current medication list in Epic reviewed, no DDIs with Zytiga identified:  Prescription has been e-scribed to the [employer] for benefits analysis and approval.  Oral Oncology Clinic will continue to follow for insurance authorization, copayment issues, initial counseling and start date.  Darl Pikes, PharmD, BCPS Hematology/Oncology Clinical Pharmacist [facility] [phone]  03/24/2017 9:03 AM

## 2017-03-24 NOTE — Telephone Encounter (Signed)
-----   Message from [name], MD sent at 03/24/2017  8:14 AM EDT ----- Please let him know his PSA is up. Change to Fabio Asa was discussed already and is on going.

## 2017-03-28 ENCOUNTER — Encounter: Payer: Self-pay | Admitting: Oncology

## 2017-03-28 NOTE — Progress Notes (Signed)
Submitted auth request for Zytiga today.  Status is pending.

## 2017-03-29 ENCOUNTER — Telehealth: Payer: Self-pay | Admitting: Pharmacist

## 2017-03-29 MED FILL — ZYTIGA 250 MG TABLET: 250 | 30 days supply | Qty: 120 | Fill #0

## 2017-03-29 NOTE — Telephone Encounter (Signed)
Oral Oncology Patient Advocate Encounter  Prior Authorization for [name] has been approved.    PA Approval # [id number] Case # [id number] Effective dates: 03/29/2017 through 03/24/2018  Oral Oncology Clinic will continue to follow.   [name]. Melynda Keller, [place] Patient [place] [phone] 03/29/2017 10:52 AM

## 2017-03-29 NOTE — Telephone Encounter (Signed)
Oral Chemotherapy Pharmacist Encounter   I spoke with patient's wife, Vaughan Basta, for overview of new oral chemotherapy medication: Zytiga for the treatment of metastatic, castration-resistant prostate cancer in conjunction with prednisone, planned duration until disease progression or unacceptable toxicity. Patient has experienced disease progression on Xtandi.  Pt is doing well. Counseled patient on administration, dosing, side effects, monitoring, drug-food interactions, safe handling, storage, and disposal.  Patient will take Zytiga 250mg  tablets, 4 tablets (1000mg ) by mouth once daily on an empty stomach, 1 hour before or 2 hours after a meal. Patient will take his Zyitga at bedtime as he was doing with his Gillermina Phy and will wait at least 1 hour before eating. Patient will take prednisone 5mg  tablet, 1 tablet by mouth one daily with breakfast. Zytiga start date: 03/30/17  Side effects include but not limited to: peripheral edema, GI upset, hypertension, hot flashes, fatigue, and arthralgias.    Reviewed with patient importance of keeping a medication schedule and plan for any missed doses.  Mrs. Diperna voiced understanding and appreciation.  Discussed consistently high BPs with Mrs. Zeoli. They have an appointment with patient's PCP in next few weeks to address this issue. BP will continue to be monitored.  All questions answered.  Patient knows to call the office with questions or concerns.  Oral Oncology Clinic will continue to follow.  Thank you,  Johny Drilling, PharmD, BCPS, BCOP 03/29/2017 12:30 PM Oral Oncology Clinic [phone]

## 2017-04-19 ENCOUNTER — Other Ambulatory Visit: Payer: Self-pay | Admitting: Oncology

## 2017-04-21 ENCOUNTER — Other Ambulatory Visit: Payer: Self-pay | Admitting: Oncology

## 2017-04-21 DIAGNOSIS — C61 Malignant neoplasm of prostate: Secondary | ICD-10-CM

## 2017-04-21 MED FILL — ZYTIGA 250 MG TABLET: 250 | 30 days supply | Qty: 120 | Fill #0

## 2017-04-28 ENCOUNTER — Ambulatory Visit (HOSPITAL_BASED_OUTPATIENT_CLINIC_OR_DEPARTMENT_OTHER): Payer: 59 | Admitting: Oncology

## 2017-04-28 ENCOUNTER — Telehealth: Payer: Self-pay | Admitting: Oncology

## 2017-04-28 ENCOUNTER — Other Ambulatory Visit (HOSPITAL_BASED_OUTPATIENT_CLINIC_OR_DEPARTMENT_OTHER): Payer: 59

## 2017-04-28 VITALS — BP 174/97 | HR 65 | Temp 98.2°F | Resp 17 | Ht 70.0 in | Wt 255.8 lb

## 2017-04-28 DIAGNOSIS — C61 Malignant neoplasm of prostate: Secondary | ICD-10-CM

## 2017-04-28 DIAGNOSIS — C775 Secondary and unspecified malignant neoplasm of intrapelvic lymph nodes: Secondary | ICD-10-CM | POA: Diagnosis not present

## 2017-04-28 DIAGNOSIS — E291 Testicular hypofunction: Secondary | ICD-10-CM | POA: Diagnosis not present

## 2017-04-28 DIAGNOSIS — C7951 Secondary malignant neoplasm of bone: Secondary | ICD-10-CM | POA: Diagnosis not present

## 2017-04-28 LAB — CBC WITH DIFFERENTIAL/PLATELET
BASO%: 1 % (ref 0.0–2.0)
BASOS ABS: 0.1 10*3/uL (ref 0.0–0.1)
EOS ABS: 0.1 10*3/uL (ref 0.0–0.5)
EOS%: 1.4 % (ref 0.0–7.0)
HEMATOCRIT: 44.6 % (ref 38.4–49.9)
HEMOGLOBIN: 14.7 g/dL (ref 13.0–17.1)
LYMPH#: 1.5 10*3/uL (ref 0.9–3.3)
LYMPH%: 17.3 % (ref 14.0–49.0)
MCH: 27.9 pg (ref 27.2–33.4)
MCHC: 32.9 g/dL (ref 32.0–36.0)
MCV: 84.7 fL (ref 79.3–98.0)
MONO#: 0.7 10*3/uL (ref 0.1–0.9)
MONO%: 8.3 % (ref 0.0–14.0)
NEUT%: 72 % (ref 39.0–75.0)
NEUTROS ABS: 6.3 10*3/uL (ref 1.5–6.5)
Platelets: 231 10*3/uL (ref 140–400)
RBC: 5.27 10*6/uL (ref 4.20–5.82)
RDW: 14.5 % (ref 11.0–14.6)
WBC: 8.7 10*3/uL (ref 4.0–10.3)

## 2017-04-28 LAB — COMPREHENSIVE METABOLIC PANEL
ALBUMIN: 3.6 g/dL (ref 3.5–5.0)
ALK PHOS: 105 U/L (ref 40–150)
ALT: 10 U/L (ref 0–55)
AST: 9 U/L (ref 5–34)
Anion Gap: 8 mEq/L (ref 3–11)
BILIRUBIN TOTAL: 0.48 mg/dL (ref 0.20–1.20)
BUN: 18.3 mg/dL (ref 7.0–26.0)
CALCIUM: 8.7 mg/dL (ref 8.4–10.4)
CO2: 26 mEq/L (ref 22–29)
Chloride: 106 mEq/L (ref 98–109)
Creatinine: 1 mg/dL (ref 0.7–1.3)
Glucose: 86 mg/dl (ref 70–140)
POTASSIUM: 4 meq/L (ref 3.5–5.1)
Sodium: 141 mEq/L (ref 136–145)
TOTAL PROTEIN: 6.8 g/dL (ref 6.4–8.3)

## 2017-04-28 NOTE — Progress Notes (Signed)
Hematology and Oncology Follow Up Visit  [name] [id number] April 29, 1961 56 y.o. 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 His initial diagnosis was in 2012 where he had a Gleason score of 4+5 = 9 and a PSA of 16.6.   Prior Therapy:  He underwent a radical prostatectomy on 04/18/2011 under the care of Dr. Alinda Money. The final pathology showed to have prostate adenocarcinoma with a Gleason score of 4+5 = 9 with tumor involving both lobes. Extraprostatic extension was noted with bilateral regional lymph node were involved with malignancy. He had 5 of 6 lymph nodes noted in the right regional lymph node basin. He also had 4 out of 5 lymph nodes noted on the left. The final pathological staging was T3b N1 with 9 positive lymph nodes out of 11 examined.  He continued to have persistent PSA and was treated with androgen deprivation. His PSA became undetectable until August 2014. In September 2015 PSA increased up to 1.29 and Casodex was added around that time.  In June 2016 PSA went up to 2.0 and subsequently to 5.6 in November 2016. Staging workup including a bone scan done on 05/15/2015 which showed a focus of increased uptake in the posterior parietal occipital region of the skull. There is also area of uptake likely degenerative in nature. CT scan of the abdomen and pelvis done on 05/15/2015 shows sclerotic osseous metastasis noted in multiple locations. Xtandi started on 06/10/2015.  Therapy discontinued in September 2018 because of progression of disease.   Current therapy:  Androgen deprivation utilizing Lupron 30 mg every 4 months his first injection received September 2017. This will be repeated every 4 months.  Zytiga 1000 mg daily with prednisone at 5 mg daily started in October 2018.  Interim History: [name] presents today for a follow-up visit. Since the last visit, he  started Zytiga without complications.  Reports feeling well without any complaints. He denies any excessive fatigue or tiredness.  He continues to perform activities of daily living.  He denied any abdominal pain, nausea or less of appetite. He has not reported any neurological issues. He does report occasional hot flashes which have not changed dramatically. He denied any pathological fractures or bone pain.  He continues to work full-time.  He has no issues related to prednisone although his appetite has increased.  His weight remains stable.   He does not report any blurry vision, syncope or seizures. Does not report any fevers, chills, sweats or weight loss. Does not report any chest pain, palpitation, orthopnea or leg edema. Does not report any cough, hemoptysis or wheezing. Does not report any nausea, vomiting, abdominal pain. Does not report any hematochezia, melena or early satiety. Does not report any frequency urgency or hesitancy. Does not report any skeletal complaints. Remaining review of systems unremarkable.  Medications: I have reviewed the patient's current medications.  Current Outpatient Prescriptions  Medication Sig Dispense Refill  . calcium-vitamin D (CALCIUM 500+D) 500-200 MG-UNIT per tablet Take 2 tablets by mouth daily.    . predniSONE (DELTASONE) 5 MG tablet TAKE 1 TABLET BY MOUTH EVERY DAY WITH BREAKFAST 30 tablet 0  . ZYTIGA 250 MG tablet TAKE 4 TABLETS (1,000 MG TOTAL) BY MOUTH DAILY. TAKE ON AN EMPTY STOMACH 1 HOUR BEFORE OR 2 HOURS AFTER A MEAL 120 tablet 0   No current facility-administered medications for this visit.      Allergies:  Allergies  Allergen Reactions  . Lanolin Itching and Rash    Past Medical History, Surgical history, Social history, and Family History were reviewed and updated.   Physical Exam: Blood pressure (!) 174/97, pulse 65, temperature 98.2 F (36.8 C), temperature source Oral, resp. rate 17, height 5\' 10"  (1.778 m), weight 255 lb  12.8 oz (116 kg), SpO2 97 %. ECOG: 0 General appearance: Alert, awake gentleman without distress. Head: Normocephalic, without obvious abnormality. No oral ulcers or lesions. Neck: no adenopathy masses or lesions. Lymph nodes: Cervical, supraclavicular, and axillary nodes normal. Heart:regular rate and rhythm, S1, S2 normal, no murmur, click, rub or gallop Lung:chest clear, no wheezing, rales, normal symmetric air entry Abdomin: soft, non-tender, without masses or organomegaly no rebound or guarding. Skin: No rashes or lesions. EXT:no erythema, induration, or nodules   Lab Results: Lab Results  Component Value Date   WBC 8.7 04/28/2017   HGB 14.7 04/28/2017   HCT 44.6 04/28/2017   MCV 84.7 04/28/2017   PLT 231 04/28/2017     Chemistry      Component Value Date/Time   NA 140 03/21/2017 1117   K 4.1 03/21/2017 1117   CL 109 08/05/2012 1524   CO2 31 (H) 03/21/2017 1117   BUN 12.9 03/21/2017 1117   CREATININE 1.0 03/21/2017 1117      Component Value Date/Time   CALCIUM 9.9 03/21/2017 1117   ALKPHOS 97 03/21/2017 1117   AST 11 03/21/2017 1117   ALT 9 03/21/2017 1117   BILITOT 0.67 03/21/2017 1117       Impression and Plan:  56 year old gentleman with the following issues:  1. 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 His initial diagnosis was in 2012 where he had a Gleason score of 4+5 = 9 and a PSA of 16.6. He underwent a robotic prostatectomy and androgen deprivation. After a PSA nadir he had been on a slow rise and his most recent PSA was 5.6 in November 2016 with possible bony metastasis detected on his most recent imaging studies.   He started Xtandi in December 2016 until September 2018. CT scan and bone scan obtained on 03/21/2017 showed mild progression of disease specifically in the bone. There is no visceral metastasis noted.  He was started on 1000 mg of Zytiga with prednisone 5 mg daily in October  2018 and tolerated it well.  Risks and benefits of continuing this medication was reviewed and he is agreed to continue.  His PSA is currently pending and we will monitor periodically.  2. Androgen depravation: I have recommended androgen deprivation to be continued indefinitely. He received Lupron at the [facility] starting in September of 2017. He will receive Lupron on 03/23/2017 and repeated in 4 months.  3. Bone directed therapy: He is a candidate for bone directed therapy. It would be reasonable to defer this to a later time.  4. Gynecomastia: No change from previous examination.  5. Family history of male breast cancer and personal history of prostate cancer.  I have recommended genetic counseling which she has deferred for the time being.  6. Follow-up: Will be in 6 weeks to follow his progress.   Zola Button, MD 11/2/20183:41 PM

## 2017-04-28 NOTE — Telephone Encounter (Signed)
Gave avs and calendar for December  °

## 2017-04-29 LAB — PSA: Prostate Specific Ag, Serum: 11.9 ng/mL — ABNORMAL HIGH (ref 0.0–4.0)

## 2017-05-01 ENCOUNTER — Telehealth: Payer: Self-pay | Admitting: *Deleted

## 2017-05-01 NOTE — Telephone Encounter (Signed)
Spoke with wife regarding PSA results. Informed her that the PSA is slightly up but there are no changes for now. She verbalized understanding.

## 2017-05-01 NOTE — Telephone Encounter (Signed)
As noted below by Dr. Alen Blew, I tried calling the patient to inform him of his PSA level. His cell phone is not set up to receive voice mail messages.

## 2017-05-01 NOTE — Telephone Encounter (Signed)
-----   Message from [name], MD sent at 05/01/2017  9:01 AM EST ----- Please let him know his is PSA is slightly up. No changes for now.

## 2017-05-08 ENCOUNTER — Telehealth: Payer: Self-pay | Admitting: *Deleted

## 2017-05-08 NOTE — Telephone Encounter (Signed)
Wife called and left a voice mail message stating,"Can you ask Dr. Alen Blew if he plans on keeping [name] on Zytiga? Last month his PSA went from 10.3 to 11.9. His next appointment with Dr. Alen Blew is 06/08/17. Samrat will run out of medication on 05/29/17 and we would need to reorder his medication before the next visit. Could we check his PSA before 05/29/17? This medication is to expensive to order and then change it. Return number is [phone]."

## 2017-05-09 NOTE — Telephone Encounter (Signed)
As noted below by Dr. Alen Blew, I informed Mrs. Cornacchia that Dr. Alen Blew wants him to continue taking Zytiga until we recheck his PSA on 12/13. It takes a few months to know if its working. Instructed her to go ahead and refill the Zytiga on 12/3. She verbalized understanding.

## 2017-05-09 NOTE — Telephone Encounter (Signed)
I want him to take for the next month till we check it again on 12/13. It takes few months to know if its working

## 2017-05-16 ENCOUNTER — Other Ambulatory Visit: Payer: Self-pay | Admitting: Oncology

## 2017-05-16 DIAGNOSIS — C61 Malignant neoplasm of prostate: Secondary | ICD-10-CM

## 2017-05-24 MED FILL — ZYTIGA 250 MG TABLET: 250 | 30 days supply | Qty: 120 | Fill #0

## 2017-06-08 ENCOUNTER — Ambulatory Visit (HOSPITAL_BASED_OUTPATIENT_CLINIC_OR_DEPARTMENT_OTHER): Payer: 59 | Admitting: Oncology

## 2017-06-08 ENCOUNTER — Telehealth: Payer: Self-pay | Admitting: Oncology

## 2017-06-08 ENCOUNTER — Other Ambulatory Visit (HOSPITAL_BASED_OUTPATIENT_CLINIC_OR_DEPARTMENT_OTHER): Payer: 59

## 2017-06-08 VITALS — BP 189/91 | HR 71 | Temp 98.5°F | Resp 19 | Ht 70.0 in | Wt 251.3 lb

## 2017-06-08 DIAGNOSIS — E291 Testicular hypofunction: Secondary | ICD-10-CM

## 2017-06-08 DIAGNOSIS — I10 Essential (primary) hypertension: Secondary | ICD-10-CM

## 2017-06-08 DIAGNOSIS — C61 Malignant neoplasm of prostate: Secondary | ICD-10-CM

## 2017-06-08 DIAGNOSIS — E876 Hypokalemia: Secondary | ICD-10-CM

## 2017-06-08 DIAGNOSIS — C7951 Secondary malignant neoplasm of bone: Secondary | ICD-10-CM | POA: Diagnosis not present

## 2017-06-08 LAB — COMPREHENSIVE METABOLIC PANEL
ALT: 12 U/L (ref 0–55)
ANION GAP: 13 meq/L — AB (ref 3–11)
AST: 17 U/L (ref 5–34)
Albumin: 4.1 g/dL (ref 3.5–5.0)
Alkaline Phosphatase: 128 U/L (ref 40–150)
BILIRUBIN TOTAL: 0.74 mg/dL (ref 0.20–1.20)
BUN: 16.1 mg/dL (ref 7.0–26.0)
CO2: 24 meq/L (ref 22–29)
Calcium: 9.6 mg/dL (ref 8.4–10.4)
Chloride: 107 mEq/L (ref 98–109)
Creatinine: 1 mg/dL (ref 0.7–1.3)
Glucose: 91 mg/dl (ref 70–140)
POTASSIUM: 3.4 meq/L — AB (ref 3.5–5.1)
Sodium: 144 mEq/L (ref 136–145)
TOTAL PROTEIN: 7.8 g/dL (ref 6.4–8.3)

## 2017-06-08 LAB — CBC WITH DIFFERENTIAL/PLATELET
BASO%: 0.4 % (ref 0.0–2.0)
Basophils Absolute: 0 10*3/uL (ref 0.0–0.1)
EOS ABS: 0.2 10*3/uL (ref 0.0–0.5)
EOS%: 2 % (ref 0.0–7.0)
HCT: 49.5 % (ref 38.4–49.9)
HGB: 16.5 g/dL (ref 13.0–17.1)
LYMPH%: 15.9 % (ref 14.0–49.0)
MCH: 28.7 pg (ref 27.2–33.4)
MCHC: 33.3 g/dL (ref 32.0–36.0)
MCV: 86.2 fL (ref 79.3–98.0)
MONO#: 0.7 10*3/uL (ref 0.1–0.9)
MONO%: 7.7 % (ref 0.0–14.0)
NEUT%: 74 % (ref 39.0–75.0)
NEUTROS ABS: 7 10*3/uL — AB (ref 1.5–6.5)
Platelets: 267 10*3/uL (ref 140–400)
RBC: 5.74 10*6/uL (ref 4.20–5.82)
RDW: 14 % (ref 11.0–14.6)
WBC: 9.4 10*3/uL (ref 4.0–10.3)
lymph#: 1.5 10*3/uL (ref 0.9–3.3)

## 2017-06-08 NOTE — Progress Notes (Signed)
Hematology and Oncology Follow Up Visit  [name] [id number] 1960/10/19 56 y.o. 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 His initial diagnosis was in 2012 where he had a Gleason score of 4+5 = 9 and a PSA of 16.6.   Prior Therapy:  He underwent a radical prostatectomy on 04/18/2011 under the care of Dr. Alinda Money. The final pathology showed to have prostate adenocarcinoma with a Gleason score of 4+5 = 9 with tumor involving both lobes. Extraprostatic extension was noted with bilateral regional lymph node were involved with malignancy. He had 5 of 6 lymph nodes noted in the right regional lymph node basin. He also had 4 out of 5 lymph nodes noted on the left. The final pathological staging was T3b N1 with 9 positive lymph nodes out of 11 examined.  He continued to have persistent PSA and was treated with androgen deprivation. His PSA became undetectable until August 2014. In September 2015 PSA increased up to 1.29 and Casodex was added around that time.  In June 2016 PSA went up to 2.0 and subsequently to 5.6 in November 2016. Staging workup including a bone scan done on 05/15/2015 which showed a focus of increased uptake in the posterior parietal occipital region of the skull. There is also area of uptake likely degenerative in nature. CT scan of the abdomen and pelvis done on 05/15/2015 shows sclerotic osseous metastasis noted in multiple locations. Xtandi started on 06/10/2015.  Therapy discontinued in September 2018 because of progression of disease.   Current therapy:  Androgen deprivation utilizing Lupron 30 mg every 4 months his first injection received September 2017. This will be repeated every 4 months.  Zytiga 1000 mg daily with prednisone at 5 mg daily started in October 2018.  Interim History: [name] presents today for a follow-up visit. Since the last visit, he  reports no major changes with his health.  He reports more energy and less fatigue associated with Zytiga.He continues to perform activities of daily living.  He denied any abdominal pain, nausea or less of appetite. He has not reported any neurological issues. He denied any pathological fractures or bone pain.  He continues to work full-time.  He did report a fall although did not have any pain or fractures associated with it.  He does report urinary frequency and pain associated with it with periodic discoloration of the urine.  He does not report any fevers or abdominal pain.  He denies any flank pain or weight loss.   He does not report any blurry vision, syncope or seizures. Does not report any fevers, chills, sweats or weight loss. Does not report any chest pain, palpitation, orthopnea or leg edema. Does not report any cough, hemoptysis or wheezing. Does not report any nausea, vomiting, abdominal pain. Does not report any hematochezia, melena or early satiety. Does not report any frequency urgency or hesitancy. Does not report any skeletal complaints. Remaining review of systems unremarkable.  Medications: I have reviewed the patient's current medications.  Current Outpatient Medications  Medication Sig Dispense Refill  . calcium-vitamin D (CALCIUM 500+D) 500-200 MG-UNIT per tablet Take 2 tablets by mouth daily.    . predniSONE (DELTASONE) 5 MG tablet TAKE 1 TABLET BY MOUTH EVERY DAY WITH BREAKFAST 30 tablet 0  . ZYTIGA 250 MG tablet TAKE 4 TABLETS (1,000 MG TOTAL) BY MOUTH DAILY. TAKE ON AN EMPTY STOMACH 1 HOUR BEFORE OR 2 HOURS  AFTER A MEAL 120 tablet 0   No current facility-administered medications for this visit.      Allergies:  Allergies  Allergen Reactions  . Lanolin Itching and Rash    Past Medical History, Surgical history, Social history, and Family History were reviewed and updated.   Physical Exam: Blood pressure (!) 189/91, pulse 71, temperature 98.5 F (36.9 C),  temperature source Oral, resp. rate 19, height 5\' 10"  (1.778 m), weight 251 lb 4.8 oz (114 kg), SpO2 100 %. ECOG: 0 General appearance: Well-appearing gentleman appears comfortable.. Head: Normocephalic, without obvious abnormality. No oral thrush or ulcers. Neck: no adenopathy or thyroid masses. Lymph nodes: Cervical, supraclavicular, and axillary nodes normal. Heart:regular rate and rhythm, S1, S2 normal, no murmur, click, rub or gallop Lung:chest clear, no wheezing, rales, normal symmetric air entry Abdomin: soft, non-tender, without masses or organomegaly no shifting dullness or ascites. Skin: No rashes or lesions. EXT:no erythema, induration, or nodules   Lab Results: Lab Results  Component Value Date   WBC 9.4 06/08/2017   HGB 16.5 06/08/2017   HCT 49.5 06/08/2017   MCV 86.2 06/08/2017   PLT 267 06/08/2017     Chemistry      Component Value Date/Time   NA 141 04/28/2017 1520   K 4.0 04/28/2017 1520   CL 109 08/05/2012 1524   CO2 26 04/28/2017 1520   BUN 18.3 04/28/2017 1520   CREATININE 1.0 04/28/2017 1520      Component Value Date/Time   CALCIUM 8.7 04/28/2017 1520   ALKPHOS 105 04/28/2017 1520   AST 9 04/28/2017 1520   ALT 10 04/28/2017 1520   BILITOT 0.48 04/28/2017 1520     Results for COXTyreck, Bell (MRN [id number]) as of 06/08/2017 14:42  Ref. Range 01/19/2017 14:23 03/23/2017 13:44 04/28/2017 15:20  Prostate Specific Ag, Serum Latest Ref Range: 0.0 - 4.0 ng/mL 4.7 (H) 10.3 (H) 11.9 (H)    Impression and Plan:  56 year old gentleman with the following issues:  1. 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 His initial diagnosis was in 2012 where he had a Gleason score of 4+5 = 9 and a PSA of 16.6. He underwent a robotic prostatectomy and androgen deprivation. After a PSA nadir he had been on a slow rise and his most recent PSA was 5.6 in November 2016 with possible bony metastasis detected on his most  recent imaging studies.   He started Xtandi in December 2016 until September 2018. CT scan and bone scan obtained on 03/21/2017 showed mild progression of disease specifically in the bone. There is no visceral metastasis noted.  He was started on 1000 mg of Zytiga with prednisone 5 mg daily in October 2018 without any issues.  His PSA in November 2018 showed mild increase but for the most part his disease is stable.  Plan is to continue with the same dose schedule for the time being and monitor his PSA periodically.  2. Androgen depravation: I have recommended androgen deprivation to be continued indefinitely. He received Lupron at the [facility] starting in September of 2017. He will receive Lupron on 07/25/2017  3. Bone directed therapy: He is a candidate for bone directed therapy. It would be reasonable to defer this to a later time.  4. Gynecomastia: No recent complaints noted.  5. Family history of male breast cancer and personal history of prostate cancer.  I have recommended genetic counseling which she has deferred for the time being.  6.  Hypertension:  He does report normal blood pressure outside of the clinic.  He does report some anxiety associated with his visits.  He also understands that Zytiga can cause hypertension and is important to continue monitor his blood pressure between visits.  We will continue to address this issue with him periodically.  7.  Hypokalemia: His potassium was 4.0 in November 2018 and will be repeated periodically.  8. Follow-up: Will be in 6 weeks to follow his progress.   Zola Button, MD 12/13/20183:10 PM

## 2017-06-08 NOTE — Telephone Encounter (Signed)
Gave avs and calendar for January 2019 °

## 2017-06-09 ENCOUNTER — Telehealth: Payer: Self-pay | Admitting: *Deleted

## 2017-06-09 LAB — PSA: Prostate Specific Ag, Serum: 23 ng/mL — ABNORMAL HIGH (ref 0.0–4.0)

## 2017-06-09 NOTE — Telephone Encounter (Signed)
-----   Message from [name], MD sent at 06/09/2017  8:14 AM EST ----- Please let him know his PSA is up but no change for now.

## 2017-06-09 NOTE — Telephone Encounter (Signed)
Discussed with Vaughan Basta the PSA findings and answered her questions.

## 2017-06-09 NOTE — Telephone Encounter (Signed)
As noted below by Dr. Alen Blew, I informed the wife of PSA level and no changes for now. Wife stated,"I'm concerned that the [name] is not working. The PSA level keeps rising. Can you have Dr. Alen Blew call me because I don't want to wait til January and do another PSA check and it's 40." Instructed her that I would give this message to Dr. Alen Blew. Return number is [phone].

## 2017-06-14 ENCOUNTER — Other Ambulatory Visit: Payer: Self-pay | Admitting: Oncology

## 2017-06-14 DIAGNOSIS — C61 Malignant neoplasm of prostate: Secondary | ICD-10-CM

## 2017-06-19 ENCOUNTER — Other Ambulatory Visit: Payer: Self-pay | Admitting: Oncology

## 2017-06-22 MED FILL — ZYTIGA 250 MG TABLET: 250 | 30 days supply | Qty: 120 | Fill #0

## 2017-07-12 ENCOUNTER — Telehealth: Payer: Self-pay | Admitting: Oncology

## 2017-07-12 NOTE — Telephone Encounter (Signed)
Patients wife called in to reschedule lab, she said that they wanted to make sure they could get the results when they come back the next day.

## 2017-07-19 ENCOUNTER — Other Ambulatory Visit: Payer: Self-pay | Admitting: Oncology

## 2017-07-24 ENCOUNTER — Inpatient Hospital Stay: Payer: 59 | Attending: Oncology

## 2017-07-24 DIAGNOSIS — Z79818 Long term (current) use of other agents affecting estrogen receptors and estrogen levels: Secondary | ICD-10-CM | POA: Diagnosis not present

## 2017-07-24 DIAGNOSIS — C7951 Secondary malignant neoplasm of bone: Secondary | ICD-10-CM | POA: Insufficient documentation

## 2017-07-24 DIAGNOSIS — C61 Malignant neoplasm of prostate: Secondary | ICD-10-CM | POA: Diagnosis not present

## 2017-07-24 DIAGNOSIS — R9721 Rising PSA following treatment for malignant neoplasm of prostate: Secondary | ICD-10-CM | POA: Diagnosis not present

## 2017-07-24 DIAGNOSIS — I10 Essential (primary) hypertension: Secondary | ICD-10-CM | POA: Diagnosis not present

## 2017-07-24 LAB — COMPREHENSIVE METABOLIC PANEL
ALK PHOS: 139 U/L (ref 40–150)
ALT: 11 U/L (ref 0–55)
AST: 9 U/L (ref 5–34)
Albumin: 3.7 g/dL (ref 3.5–5.0)
Anion gap: 8 (ref 3–11)
BUN: 21 mg/dL (ref 7–26)
CALCIUM: 8.9 mg/dL (ref 8.4–10.4)
CHLORIDE: 107 mmol/L (ref 98–109)
CO2: 25 mmol/L (ref 22–29)
CREATININE: 1.29 mg/dL (ref 0.70–1.30)
GFR calc Af Amer: >60 mL/min (ref >60)
GFR calc non Af Amer: 60 mL/min — ABNORMAL LOW (ref >60)
GLUCOSE: 104 mg/dL (ref 70–140)
Potassium: 4.1 mmol/L (ref 3.5–5.1)
SODIUM: 140 mmol/L (ref 136–145)
Total Bilirubin: 0.4 mg/dL (ref 0.2–1.2)
Total Protein: 7 g/dL (ref 6.4–8.3)

## 2017-07-24 LAB — CBC WITH DIFFERENTIAL/PLATELET
BASOS PCT: 1 %
Basophils Absolute: 0.1 10*3/uL (ref 0.0–0.1)
EOS ABS: 0.1 10*3/uL (ref 0.0–0.5)
EOS PCT: 1 %
HCT: 45.1 % (ref 38.4–49.9)
Hemoglobin: 14.9 g/dL (ref 13.0–17.1)
LYMPHS ABS: 1.4 10*3/uL (ref 0.9–3.3)
Lymphocytes Relative: 13 %
MCH: 28 pg (ref 27.2–33.4)
MCHC: 33.1 g/dL (ref 32.0–36.0)
MCV: 84.5 fL (ref 79.3–98.0)
MONO ABS: 0.6 10*3/uL (ref 0.1–0.9)
MONOS PCT: 5 %
Neutro Abs: 8.6 10*3/uL — ABNORMAL HIGH (ref 1.5–6.5)
Neutrophils Relative %: 80 %
PLATELETS: 240 10*3/uL (ref 140–400)
RBC: 5.33 MIL/uL (ref 4.20–5.82)
RDW: 14.6 % (ref 11.0–15.6)
WBC: 10.7 10*3/uL — ABNORMAL HIGH (ref 4.0–10.3)

## 2017-07-25 ENCOUNTER — Ambulatory Visit: Payer: 59

## 2017-07-25 ENCOUNTER — Encounter: Payer: Self-pay | Admitting: *Deleted

## 2017-07-25 ENCOUNTER — Telehealth: Payer: Self-pay | Admitting: Oncology

## 2017-07-25 ENCOUNTER — Inpatient Hospital Stay (HOSPITAL_BASED_OUTPATIENT_CLINIC_OR_DEPARTMENT_OTHER): Payer: 59 | Admitting: Oncology

## 2017-07-25 ENCOUNTER — Other Ambulatory Visit: Payer: 59

## 2017-07-25 VITALS — BP 177/89 | HR 78 | Temp 97.6°F | Resp 18 | Ht 70.0 in | Wt 255.8 lb

## 2017-07-25 DIAGNOSIS — Z7189 Other specified counseling: Secondary | ICD-10-CM

## 2017-07-25 DIAGNOSIS — C61 Malignant neoplasm of prostate: Secondary | ICD-10-CM

## 2017-07-25 DIAGNOSIS — Z79818 Long term (current) use of other agents affecting estrogen receptors and estrogen levels: Secondary | ICD-10-CM | POA: Diagnosis not present

## 2017-07-25 DIAGNOSIS — R9721 Rising PSA following treatment for malignant neoplasm of prostate: Secondary | ICD-10-CM | POA: Diagnosis not present

## 2017-07-25 DIAGNOSIS — C7951 Secondary malignant neoplasm of bone: Secondary | ICD-10-CM

## 2017-07-25 LAB — PROSTATE-SPECIFIC AG, SERUM (LABCORP): PROSTATE SPECIFIC AG, SERUM: 38.4 ng/mL — AB (ref 0.0–4.0)

## 2017-07-25 MED ORDER — LEUPROLIDE ACETATE (4 MONTH) 30 MG IM KIT
30.0000 mg | PACK | Freq: Once | INTRAMUSCULAR | Status: AC
  Administered 2017-07-25: 30 mg via INTRAMUSCULAR
  Filled 2017-07-25: qty 30

## 2017-07-25 MED ORDER — LIDOCAINE-PRILOCAINE 2.5-2.5 % EX CREA: 1.0000 "application " | TOPICAL_CREAM | CUTANEOUS | 0 refills | Status: AC | PRN

## 2017-07-25 MED ORDER — PROCHLORPERAZINE MALEATE 10 MG PO TABS: 10.0000 mg | ORAL_TABLET | Freq: Four times a day (QID) | ORAL | 0 refills | Status: DC | PRN

## 2017-07-25 NOTE — Progress Notes (Signed)
Hematology and Oncology Follow Up Visit  [name] [id number] [birth year] 57 y.o. 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  He was diagnosed in 2012 with Gleason score of 4+5 = 9 and a PSA of 16.6.   Prior Therapy:  He underwent a radical prostatectomy on 04/18/2011 under the care of Dr. Alinda Money. The final pathological staging was T3b N1 with 9 positive lymph nodes out of 11 examined.  He continued to have persistent PSA and was treated with androgen deprivation. His PSA became undetectable until August 2014. In September 2015 PSA increased up to 1.29 and Casodex was added around that time.   In June 2016 PSA went up to 2.0 and subsequently to 5.6 in November 2016. Staging workup including a bone scan done on 05/15/2015 which showed a focus of increased uptake in the posterior parietal occipital region of the skull.   Xtandi started on 06/10/2015.  Therapy discontinued in September 2018 because of progression of disease.  Zytiga 1000 mg daily with prednisone at 5 mg daily started in October 2018.  Therapy discontinued in January 2019 because of poor response.  Current therapy:   Androgen deprivation utilizing Lupron 30 mg every 4 months.  He is due for repeat injection on July 25, 2017.  Under consideration to start systemic chemotherapy.  Interim History: [name] is here for a follow-up.  He has been feeling reasonably well since the last visit.  He reports he has reported more flexibility and less discomfort in his back.  He does have periodic back pain but it has been very limited and not associated with any exacerbation.  He denies any neurological deficits or weakness.  He continues to work full-time without inability to do so.  He denies any frequency or hematuria.  He does report periodic nocturia.  He denies any falls or syncope or pathological fractures.  His appetite remain excellent and have gained  weight.  He continues to take Zytiga without any major complications.  He also take prednisone without any lower extremity edema or increase in his anxiety or insomnia.  He does not report any blurry vision, syncope or seizures. Does not report any fevers, chills, sweats or weight loss. Does not report any chest pain, palpitation, orthopnea or leg edema. Does not report any cough, hemoptysis or wheezing. Does not report any nausea, vomiting, abdominal pain. Does not report any hematochezia, melena or early satiety. Does not report any hematuria or dysuria. Does not report any joint effusion or swelling.  He denies any heat or cold intolerance.  He does not report any skin rashes or lesions.  He denied any anxiety or depression.  Remaining review of systems is negative.  Medications: I have reviewed the patient's current medications.  Current Outpatient Medications  Medication Sig Dispense Refill  . calcium-vitamin D (CALCIUM 500+D) 500-200 MG-UNIT per tablet Take 2 tablets by mouth daily.    Marland Kitchen lidocaine-prilocaine (EMLA) cream Apply 1 application topically as needed. 30 g 0  . predniSONE (DELTASONE) 5 MG tablet TAKE 1 TABLET BY MOUTH EVERY DAY WITH BREAKFAST 30 tablet 0  . prochlorperazine (COMPAZINE) 10 MG tablet Take 1 tablet (10 mg total) by mouth every 6 (six) hours as needed for nausea or vomiting. 30 tablet 0  . ZYTIGA 250 MG tablet TAKE 4 TABLETS (1,000 MG TOTAL) BY MOUTH DAILY. TAKE ON AN EMPTY STOMACH 1 HOUR BEFORE OR 2 HOURS AFTER A MEAL 120 tablet 0  No current facility-administered medications for this visit.      Allergies:  Allergies  Allergen Reactions  . Lanolin Itching and Rash    Past Medical History, Surgical history, Social history, and Family History    Physical Exam: Blood pressure (!) 177/89, pulse 78, temperature 97.6 F (36.4 C), temperature source Oral, resp. rate 18, height '5\' 10"'  (1.778 m), weight 255 lb 12.8 oz (116 kg), SpO2 97 %. ECOG: 0 General  appearance: Alert, awake gentleman without distress. Head: Normocephalic, without obvious abnormality.  Oropharynx without any oral ulcers or thrush. Eyes: No scleral icterus.  Pupils are equal and round reactive to light. Lymph nodes: Cervical, supraclavicular, and axillary nodes normal. Heart:regular rate and rhythm, S1, S2 normal, no murmur, click, rub or gallop Lung:chest clear to auscultation in all lung fields, no wheezing, Abdomin: No rebound or guarding.  Soft without any tenderness.  Good bowel sounds in all 4 quadrants. Skin: No rashes or lesions.  No ecchymosis or petechiae. Musculoskeletal: No joint deformity or effusion. Neurological: No deficits noted.    Lab Results: Lab Results  Component Value Date   WBC 10.7 (H) 07/24/2017   HGB 14.9 07/24/2017   HCT 45.1 07/24/2017   MCV 84.5 07/24/2017   PLT 240 07/24/2017     Chemistry      Component Value Date/Time   NA 140 07/24/2017 1603   NA 144 06/08/2017 1423   K 4.1 07/24/2017 1603   K 3.4 (L) 06/08/2017 1423   CL 107 07/24/2017 1603   CO2 25 07/24/2017 1603   CO2 24 06/08/2017 1423   BUN 21 07/24/2017 1603   BUN 16.1 06/08/2017 1423   CREATININE 1.29 07/24/2017 1603   CREATININE 1.0 06/08/2017 1423      Component Value Date/Time   CALCIUM 8.9 07/24/2017 1603   CALCIUM 9.6 06/08/2017 1423   ALKPHOS 139 07/24/2017 1603   ALKPHOS 128 06/08/2017 1423   AST 9 07/24/2017 1603   AST 17 06/08/2017 1423   ALT 11 07/24/2017 1603   ALT 12 06/08/2017 1423   BILITOT 0.4 07/24/2017 1603   BILITOT 0.74 06/08/2017 1423     Results for [name], [name] (MRN [id number]) as of 07/25/2017 16:31  Ref. Range 06/08/2017 14:23 07/24/2017 16:03  Prostate Specific Ag, Serum Latest Ref Range: 0.0 - 4.0 ng/mL 23.0 (H) 38.4 (H)     Impression and Plan:  57 year old gentleman with the following issues:  1. 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 His initial diagnosis was in 2012  where he had a Gleason score of 4+5 = 9 and a PSA of 16.6.    He developed advanced disease with multiple treatment outlined above and currently has castration resistant disease that has progressed on Zytiga.  His last PSA from July 24, 2017 was reviewed and discussed with the patient and his family.  He has clearly developing rapidly rising PSA with a doubling time of less than 3 months.  The natural course of this disease and treatment options were discussed today in detail.  Given the rise in his PSA I have recommended proceeding with systemic chemotherapy in the form of Taxotere.  Risks and benefits and alternatives were reviewed today.  Complication associated with this medication include nausea, vomiting, myelosuppression, neutropenia, neutropenic sepsis, hair loss, infusion related complications among others.  The benefit would be improving in his bone pain and increased overall survival.  Alternative therapy will be Trudi Ida which would be bone directed therapy only with potential  improvement in his bone pain and overall survival.  After discussion today he is agreeable to proceed with chemotherapy after chemotherapy education class.  He will discontinue Zytiga and will continue prednisone with Taxotere.  2. Androgen depravation: He will continue Lupron indefinitely.  He will receive 30 mg today and every 4 months.  3. Bone directed therapy: He is a increased risk of developing pathological fractures and would be a good candidate for Xgeva for bony protective purposes.  This will be addressed in the future after obtaining dental clearance.  4.  IV access: Risks and benefits of a Port-A-Cath insertion was reviewed today.  These complications including bleeding, thrombosis and infection were discussed and he is agreeable to have that done in the near future.  5. Family history of male breast cancer and personal history of prostate cancer.  I have recommended genetic counseling which she has  deferred for the time being.  It would be helpful to check him for BRCA mutation which could have therapeutic implications moving forward.  6.  Hypertension: Could have been exacerbated by Zytiga.  With discontinuation of Zytiga will help at this time.  7.  Neutropenia prophylaxis: He will receive Neulasta onpro for convenience given his long commute.  8.  Nausea prophylaxis: Prescription for Compazine will be given to the patient.  9.  Goals of care: This was discussed with the patient in detail.  He has an incurable malignancy and the goal of chemotherapy is palliative.  He has an excellent performance status and aggressive therapy is warranted.  10. Follow-up: Will be in in the next 2 weeks to start chemotherapy.  30  minutes was spent with the patient face-to-face today.  More than 50% of time was dedicated to patient counseling, education and coordination of his multifaceted care.    Zola Button, MD 1/29/20194:22 PM

## 2017-07-25 NOTE — Patient Instructions (Signed)
Leuprolide depot injection What is this medicine? LEUPROLIDE (loo PROE lide) is a man-made protein that acts like a natural hormone in the body. It decreases testosterone in men and decreases estrogen in women. In men, this medicine is used to treat advanced prostate cancer. In women, some forms of this medicine may be used to treat endometriosis, uterine fibroids, or other male hormone-related problems. This medicine may be used for other purposes; ask your health care provider or pharmacist if you have questions. What should I tell my health care provider before I take this medicine? They need to know if you have any of these conditions: -diabetes -heart disease or previous heart attack -high blood pressure -high cholesterol -osteoporosis -pain or difficulty passing urine -spinal cord metastasis -stroke -tobacco smoker -unusual vaginal bleeding (women) -an unusual or allergic reaction to leuprolide, benzyl alcohol, other medicines, foods, dyes, or preservatives -pregnant or trying to get pregnant -breast-feeding How should I use this medicine? This medicine is for injection into a muscle or for injection under the skin. It is given by a health care professional in a hospital or clinic setting. The specific product will determine how it will be given to you. Make sure you understand which product you receive and how often you will receive it. Talk to your pediatrician regarding the use of this medicine in children. Special care may be needed. Overdosage: If you think you have taken too much of this medicine contact a poison control center or emergency room at once. NOTE: This medicine is only for you. Do not share this medicine with others. What if I miss a dose? It is important not to miss a dose. Call your doctor or health care professional if you are unable to keep an appointment. Depot injections: Depot injections are given either once-monthly, every 12 weeks, every 16 weeks, or  every 24 weeks depending on the product you are prescribed. The product you are prescribed will be based on if you are male or male, and your condition. Make sure you understand your product and dosing. What may interact with this medicine? Do not take this medicine with any of the following medications: -chasteberry This medicine may also interact with the following medications: -herbal or dietary supplements, like black cohosh or DHEA -male hormones, like estrogens or progestins and birth control pills, patches, rings, or injections -male hormones, like testosterone This list may not describe all possible interactions. Give your health care provider a list of all the medicines, herbs, non-prescription drugs, or dietary supplements you use. Also tell them if you smoke, drink alcohol, or use illegal drugs. Some items may interact with your medicine. What should I watch for while using this medicine? Visit your doctor or health care professional for regular checks on your progress. During the first weeks of treatment, your symptoms may get worse, but then will improve as you continue your treatment. You may get hot flashes, increased bone pain, increased difficulty passing urine, or an aggravation of nerve symptoms. Discuss these effects with your doctor or health care professional, some of them may improve with continued use of this medicine. Male patients may experience a menstrual cycle or spotting during the first months of therapy with this medicine. If this continues, contact your doctor or health care professional. What side effects may I notice from receiving this medicine? Side effects that you should report to your doctor or health care professional as soon as possible: -allergic reactions like skin rash, itching or hives, swelling of the   face, lips, or tongue -breathing problems -chest pain -depression or memory disorders -pain in your legs or groin -pain at site where injected or  implanted -severe headache -swelling of the feet and legs -visual changes -vomiting Side effects that usually do not require medical attention (report to your doctor or health care professional if they continue or are bothersome): -breast swelling or tenderness -decrease in sex drive or performance -diarrhea -hot flashes -loss of appetite -muscle, joint, or bone pains -nausea -redness or irritation at site where injected or implanted -skin problems or acne This list may not describe all possible side effects. Call your doctor for medical advice about side effects. You may report side effects to FDA at 1-800-FDA-1088. Where should I keep my medicine? This drug is given in a hospital or clinic and will not be stored at home. NOTE: This sheet is a summary. It may not cover all possible information. If you have questions about this medicine, talk to your doctor, pharmacist, or health care provider.    2016, [employer]vier/Gold Standard. (2014-03-07 14:16:23)  

## 2017-07-25 NOTE — Telephone Encounter (Signed)
Scheduled appt per 1/29 los - Gave patient AVS and calender per los. - unable to schedule first treatment due to capped day - pt aware they will be called when appt is scheduled - logged infusion add on book.

## 2017-07-25 NOTE — Progress Notes (Signed)
START ON PATHWAY REGIMEN - Prostate     A cycle is every 21 days:     Docetaxel      Prednisone   **Always confirm dose/schedule in your pharmacy ordering system**    Patient Characteristics: Adenocarcinoma, Metastatic, Castration Resistant, Symptomatic, Docetaxel Eligible Current radiographic evidence of distant metastasis<= Yes Histology: Adenocarcinoma AJCC T Category: cTX Gleason Primary: X AJCC N Category: NX Gleason Secondary: X AJCC M Category: M1c Gleason Score: X AJCC 8 Stage Grouping: IVB PSA Values (ng/mL): X Would you be surprised if this patient died  in the next year<= I would be surprised if this patient died in the next year Intent of Therapy: Non-Curative / Palliative Intent, Discussed with Patient

## 2017-07-26 NOTE — Telephone Encounter (Signed)
Scheduled appt per 1/30 los - treatment on 2/8 -pt wife is aware .

## 2017-07-27 ENCOUNTER — Other Ambulatory Visit: Payer: Self-pay | Admitting: Radiology

## 2017-07-28 ENCOUNTER — Ambulatory Visit (HOSPITAL_COMMUNITY)
Admission: RE | Admit: 2017-07-28 | Discharge: 2017-07-28 | Disposition: A | Discharge status: Home / Self Care | Payer: 59 | Source: Ambulatory Visit | Attending: Oncology | Admitting: Oncology

## 2017-07-28 ENCOUNTER — Other Ambulatory Visit: Payer: Self-pay | Admitting: Oncology

## 2017-07-28 ENCOUNTER — Encounter (HOSPITAL_COMMUNITY): Payer: Self-pay

## 2017-07-28 DIAGNOSIS — I10 Essential (primary) hypertension: Secondary | ICD-10-CM | POA: Diagnosis not present

## 2017-07-28 DIAGNOSIS — C61 Malignant neoplasm of prostate: Secondary | ICD-10-CM | POA: Insufficient documentation

## 2017-07-28 DIAGNOSIS — Z7952 Long term (current) use of systemic steroids: Secondary | ICD-10-CM | POA: Diagnosis not present

## 2017-07-28 DIAGNOSIS — Z87891 Personal history of nicotine dependence: Secondary | ICD-10-CM | POA: Insufficient documentation

## 2017-07-28 HISTORY — PX: IR US GUIDE VASC ACCESS RIGHT: IMG2390

## 2017-07-28 HISTORY — PX: IR FLUORO GUIDE PORT INSERTION RIGHT: IMG5741

## 2017-07-28 LAB — CBC
HCT: 46.9 % (ref 39.0–52.0)
Hemoglobin: 15.9 g/dL (ref 13.0–17.0)
MCH: 29.1 pg (ref 26.0–34.0)
MCHC: 33.9 g/dL (ref 30.0–36.0)
MCV: 85.9 fL (ref 78.0–100.0)
PLATELETS: 234 10*3/uL (ref 150–400)
RBC: 5.46 MIL/uL (ref 4.22–5.81)
RDW: 14.2 % (ref 11.5–15.5)
WBC: 5.5 10*3/uL (ref 4.0–10.5)

## 2017-07-28 LAB — PROTIME-INR
INR: 0.89
PROTHROMBIN TIME: 12 s (ref 11.4–15.2)

## 2017-07-28 LAB — BASIC METABOLIC PANEL
Anion gap: 9 (ref 5–15)
BUN: 15 mg/dL (ref 6–20)
CALCIUM: 9 mg/dL (ref 8.9–10.3)
CHLORIDE: 107 mmol/L (ref 101–111)
CO2: 25 mmol/L (ref 22–32)
CREATININE: 1.01 mg/dL (ref 0.61–1.24)
GFR calc non Af Amer: >60 mL/min (ref >60)
Glucose, Bld: 105 mg/dL — ABNORMAL HIGH (ref 65–99)
Potassium: 3.7 mmol/L (ref 3.5–5.1)
SODIUM: 141 mmol/L (ref 135–145)

## 2017-07-28 LAB — APTT: aPTT: 26 seconds (ref 24–36)

## 2017-07-28 MED ORDER — CEFAZOLIN SODIUM-DEXTROSE 2-4 GM/100ML-% IV SOLN
INTRAVENOUS | Status: AC
  Filled 2017-07-28: qty 100

## 2017-07-28 MED ORDER — MIDAZOLAM HCL 2 MG/2ML IJ SOLN
INTRAMUSCULAR | Status: AC
  Filled 2017-07-28: qty 4

## 2017-07-28 MED ORDER — HEPARIN SOD (PORK) LOCK FLUSH 100 UNIT/ML IV SOLN
INTRAVENOUS | Status: AC
  Filled 2017-07-28: qty 5

## 2017-07-28 MED ORDER — LIDOCAINE HCL 1 % IJ SOLN
INTRAMUSCULAR | Status: AC | PRN
  Administered 2017-07-28: 20 mL

## 2017-07-28 MED ORDER — MIDAZOLAM HCL 2 MG/2ML IJ SOLN
INTRAMUSCULAR | Status: AC | PRN
  Administered 2017-07-28 (×3): 1 mg via INTRAVENOUS

## 2017-07-28 MED ORDER — SODIUM CHLORIDE 0.9 % IV SOLN
INTRAVENOUS | Status: DC
  Administered 2017-07-28: 08:00:00 via INTRAVENOUS

## 2017-07-28 MED ORDER — LIDOCAINE HCL 1 % IJ SOLN
INTRAMUSCULAR | Status: AC
  Filled 2017-07-28: qty 20

## 2017-07-28 MED ORDER — FENTANYL CITRATE (PF) 100 MCG/2ML IJ SOLN
INTRAMUSCULAR | Status: AC
  Filled 2017-07-28: qty 4

## 2017-07-28 MED ORDER — NALOXONE HCL 0.4 MG/ML IJ SOLN
INTRAMUSCULAR | Status: AC
  Filled 2017-07-28: qty 1

## 2017-07-28 MED ORDER — FLUMAZENIL 0.5 MG/5ML IV SOLN
INTRAVENOUS | Status: AC
  Filled 2017-07-28: qty 5

## 2017-07-28 MED ORDER — CEFAZOLIN SODIUM-DEXTROSE 2-4 GM/100ML-% IV SOLN
2.0000 g | INTRAVENOUS | Status: AC
  Administered 2017-07-28: 2 g via INTRAVENOUS

## 2017-07-28 MED ORDER — FENTANYL CITRATE (PF) 100 MCG/2ML IJ SOLN
INTRAMUSCULAR | Status: AC | PRN
  Administered 2017-07-28 (×2): 50 ug via INTRAVENOUS

## 2017-07-28 NOTE — Consult Note (Signed)
Chief Complaint: Patient was seen in consultation today for Port-A-Cath placement  Referring Physician(s): [name]  Supervising Physician: Corrie Mckusick  Patient Status: [facility] - Out-pt  History of Present Illness: [name] is a 57 y.o. male with history of progressive castrate resistant metastatic prostate carcinoma who presents today for Port-A-Cath placement for palliative chemotherapy.  Past Medical History:  Diagnosis Date  . Cancer ([place])   . Hypertension     History reviewed. No pertinent surgical history.  Allergies: Lanolin  Medications: Prior to Admission medications   Medication Sig Start Date End Date Taking? Authorizing Provider  calcium-vitamin D (CALCIUM 500+D) 500-200 MG-UNIT per tablet Take 2 tablets by mouth daily.   Yes [provider]  leuprolide (LUPRON) 22.5 MG injection Inject 22.5 mg into the muscle every 3 (three) months.   Yes [provider]  predniSONE (DELTASONE) 5 MG tablet TAKE 1 TABLET BY MOUTH EVERY DAY WITH BREAKFAST 07/19/17  Yes Shadad, Mathis Dad, MD  ZYTIGA 250 MG tablet TAKE 4 TABLETS (1,000 MG TOTAL) BY MOUTH DAILY. TAKE ON AN EMPTY STOMACH 1 HOUR BEFORE OR 2 HOURS AFTER A MEAL 06/14/17  Yes [name], MD  lidocaine-prilocaine (EMLA) cream Apply 1 application topically as needed. 07/25/17   [name], MD  prochlorperazine (COMPAZINE) 10 MG tablet Take 1 tablet (10 mg total) by mouth every 6 (six) hours as needed for nausea or vomiting. 07/25/17   [name], MD     History reviewed. No pertinent family history.  Social History   Socioeconomic History  . Marital status: Married    Spouse name: None  . Number of children: None  . Years of education: None  . Highest education level: None  Social Needs  . Financial resource strain: None  . Food insecurity - worry: None  . Food insecurity - inability: None  . Transportation needs - medical: None  . Transportation needs - non-medical: None    Occupational History  . None  Tobacco Use  . Smoking status: Former Research scientist (life sciences)  . Smokeless tobacco: Never Used  Substance and Sexual Activity  . Alcohol use: None  . Drug use: None  . Sexual activity: None  Other Topics Concern  . None  Social History Narrative  . None      Review of Systems currently denies fever, headache, chest pain, dyspnea, cough, abdominal pain, nausea, vomiting or bleeding.  He does have fatigue as well as intermittent back pain.  Vital Signs: BP (!) 183/97 (BP Location: Left Arm)   Pulse 61   Temp 98 F (36.7 C) (Oral)   Resp 18   SpO2 100%   Physical Exam awake, alert.  Chest clear to auscultation bilaterally.  Heart with regular rate and rhythm.  Abdomen obese, soft, positive bowel sounds, nontender.  Trace lower extremity edema bilaterally.  Imaging: No results found.  Labs:  CBC: Recent Labs    04/28/17 1520 06/08/17 1423 07/24/17 1603 07/28/17 0746  WBC 8.7 9.4 10.7* 5.5  HGB 14.7 16.5 14.9 15.9  HCT 44.6 49.5 45.1 46.9  PLT 231 267 240 234    COAGS: Recent Labs    07/28/17 0746  INR 0.89  APTT 26    BMP: Recent Labs    04/28/17 1520 06/08/17 1423 07/24/17 1603 07/28/17 0746  NA 141 144 140 141  K 4.0 3.4* 4.1 3.7  CL  --   --  107 107  CO2 26 24 25 25   GLUCOSE 86 91 104  105*  BUN 18.3 16.1 21 15   CALCIUM 8.7 9.6 8.9 9.0  CREATININE 1.0 1.0 1.29 1.01  GFRNONAA  --   --  60* >60  GFRAA  --   --  >60 >60    LIVER FUNCTION TESTS: Recent Labs    03/21/17 1117 04/28/17 1520 06/08/17 1423 07/24/17 1603  BILITOT 0.67 0.48 0.74 0.4  AST 11 9 17 9   ALT 9 10 12 11   ALKPHOS 97 105 128 139  PROT 7.4 6.8 7.8 7.0  ALBUMIN 3.7 3.6 4.1 3.7    TUMOR MARKERS: No results for input(s): AFPTM, CEA, CA199, CHROMGRNA in the last 8760 hours.  Assessment and Plan: 57 y.o. male with history of progressive castrate resistant metastatic prostate carcinoma who presents today for Port-A-Cath placement for palliative  chemotherapy.Risks and benefits discussed with the patient/spouse including, but not limited to bleeding, infection, pneumothorax, or fibrin sheath development and need for additional procedures.All of the patient's questions were answered, patient is agreeable to proceed.Consent signed and in chart.     Thank you for this interesting consult.  I greatly enjoyed meeting [name] and look forward to participating in their care.  A copy of this report was sent to the requesting provider on this date.  Electronically Signed: D. Rowe Robert, PA-C 07/28/2017, 8:56 AM   I spent a total of 20 minutes  in face to face in clinical consultation, greater than 50% of which was counseling/coordinating care for Port-A-Cath placement

## 2017-07-28 NOTE — Discharge Instructions (Signed)
Moderate Conscious Sedation, Adult, Care After These instructions provide you with information about caring for yourself after your procedure. Your health care provider may also give you more specific instructions. Your treatment has been planned according to current medical practices, but problems sometimes occur. Call your health care provider if you have any problems or questions after your procedure. What can I expect after the procedure? After your procedure, it is common:  To feel sleepy for several hours.  To feel clumsy and have poor balance for several hours.  To have poor judgment for several hours.  To vomit if you eat too soon.  Follow these instructions at home: For at least 24 hours after the procedure:   Do not: ? Participate in activities where you could fall or become injured. ? Drive. ? Use heavy machinery. ? Drink alcohol. ? Take sleeping pills or medicines that cause drowsiness. ? Make important decisions or sign legal documents. ? Take care of children on your own.  Rest. Eating and drinking  Follow the diet recommended by your health care provider.  If you vomit: ? Drink water, juice, or soup when you can drink without vomiting. ? Make sure you have little or no nausea before eating solid foods. General instructions  Have a responsible adult stay with you until you are awake and alert.  Take over-the-counter and prescription medicines only as told by your health care provider.  If you smoke, do not smoke without supervision.  Keep all follow-up visits as told by your health care provider. This is important. Contact a health care provider if:  You keep feeling nauseous or you keep vomiting.  You feel light-headed.  You develop a rash.  You have a fever. Get help right away if:  You have trouble breathing. This information is not intended to replace advice given to you by your health care provider. Make sure you discuss any questions you have  with your health care provider. Document Released: 04/03/2013 Document Revised: 11/16/2015 Document Reviewed: 10/03/2015 Elsevier Interactive Patient Education  2018 [place] Insertion, Care After This sheet gives you information about how to care for yourself after your procedure. Your health care provider may also give you more specific instructions. If you have problems or questions, contact your health care provider. What can I expect after the procedure? After your procedure, it is common to have:  Discomfort at the port insertion site.  Bruising on the skin over the port. This should improve over 3-4 days.  Follow these instructions at home: [facility] care  After your port is placed, you will get a manufacturer's information card. The card has information about your port. Keep this card with you at all times.  Take care of the port as told by your health care provider. Ask your health care provider if you or a family member can get training for taking care of the port at home. A home health care nurse may also take care of the port.  Make sure to remember what type of port you have. Incision care  Follow instructions from your health care provider about how to take care of your port insertion site. Make sure you: ? Wash your hands with soap and water before you change your bandage (dressing). If soap and water are not available, use hand sanitizer. ? Change your dressing as told by your health care provider.  You may remove your dressing tomorrow. ? Leave skin glue in place. These skin  closures may need to stay in place for 2 weeks or longer. If adhesive strip edges start to loosen and curl up, you may trim the loose edges. Do not remove adhesive strips completely unless your health care provider tells you to do that.  DO NOT use EMLA cream for 2 weeks after port placement as this cream will remove your surgical glue on your incision.  Check your port insertion  site every day for signs of infection. Check for: ? More redness, swelling, or pain. ? More fluid or blood. ? Warmth. ? Pus or a bad smell. General instructions  Do not take baths, swim, or use a hot tub until your health care provider approves.  You may shower tomorrow.  Do not lift anything that is heavier than 10 lb (4.5 kg) for a week, or as told by your health care provider.  Ask your health care provider when it is okay to: ? Return to work or school. ? Resume usual physical activities or sports.  Do not drive for 24 hours if you were given a medicine to help you relax (sedative).  Take over-the-counter and prescription medicines only as told by your health care provider.  Wear a medical alert bracelet in case of an emergency. This will tell any health care providers that you have a port.  Keep all follow-up visits as told by your health care provider. This is important. Contact a health care provider if:  You have a fever or chills.  You have more redness, swelling, or pain around your port insertion site.  You have more fluid or blood coming from your port insertion site.  Your port insertion site feels warm to the touch.  You have pus or a bad smell coming from the port insertion site. Get help right away if:  You have chest pain or shortness of breath.  You have bleeding from your port that you cannot control. Summary  Take care of the port as told by your health care provider.  Change your dressing as told by your health care provider.  Keep all follow-up visits as told by your health care provider. This information is not intended to replace advice given to you by your health care provider. Make sure you discuss any questions you have with your health care provider. Document Released: 04/03/2013 Document Revised: 05/04/2016 Document Reviewed: 05/04/2016 Elsevier Interactive Patient Education  2017 [employer].

## 2017-07-28 NOTE — Sedation Documentation (Signed)
Patient denies pain and is resting comfortably.  

## 2017-07-28 NOTE — Procedures (Signed)
Interventional Radiology Procedure Note  Procedure: Placement of a right IJ approach single lumen PowerPort.  Tip is positioned at the superior cavoatrial junction and catheter is ready for immediate use.  Complications: None Recommendations:  - Ok to shower tomorrow - Do not submerge for 7 days - Routine line care   Signed,  Tianna Baus S. Erubiel Manasco, DO   

## 2017-07-31 ENCOUNTER — Inpatient Hospital Stay: Payer: 59 | Attending: Oncology

## 2017-07-31 DIAGNOSIS — C7951 Secondary malignant neoplasm of bone: Secondary | ICD-10-CM | POA: Insufficient documentation

## 2017-07-31 DIAGNOSIS — Z87891 Personal history of nicotine dependence: Secondary | ICD-10-CM | POA: Insufficient documentation

## 2017-07-31 DIAGNOSIS — C61 Malignant neoplasm of prostate: Secondary | ICD-10-CM | POA: Insufficient documentation

## 2017-07-31 DIAGNOSIS — Z79899 Other long term (current) drug therapy: Secondary | ICD-10-CM | POA: Insufficient documentation

## 2017-07-31 DIAGNOSIS — I10 Essential (primary) hypertension: Secondary | ICD-10-CM | POA: Insufficient documentation

## 2017-07-31 DIAGNOSIS — M7918 Myalgia, other site: Secondary | ICD-10-CM | POA: Insufficient documentation

## 2017-08-01 ENCOUNTER — Telehealth: Payer: Self-pay | Admitting: *Deleted

## 2017-08-01 NOTE — Telephone Encounter (Signed)
Patient gave this nurse a Return to Work Release Form to be filled out and faxed back to [employer]. Fax # [phone]. Faxed to [employer]. Sent form to be scanned into patient's chart.

## 2017-08-03 ENCOUNTER — Telehealth: Payer: Self-pay | Admitting: Oncology

## 2017-08-03 NOTE — Telephone Encounter (Signed)
Spoke with patient's wife regarding appts that were added.

## 2017-08-04 ENCOUNTER — Inpatient Hospital Stay: Payer: 59

## 2017-08-04 ENCOUNTER — Ambulatory Visit (HOSPITAL_BASED_OUTPATIENT_CLINIC_OR_DEPARTMENT_OTHER): Payer: 59 | Admitting: Medical

## 2017-08-04 VITALS — BP 149/86 | HR 61 | Temp 98.7°F | Resp 16

## 2017-08-04 DIAGNOSIS — C61 Malignant neoplasm of prostate: Secondary | ICD-10-CM

## 2017-08-04 DIAGNOSIS — C7951 Secondary malignant neoplasm of bone: Secondary | ICD-10-CM

## 2017-08-04 DIAGNOSIS — Z79899 Other long term (current) drug therapy: Secondary | ICD-10-CM

## 2017-08-04 DIAGNOSIS — I10 Essential (primary) hypertension: Secondary | ICD-10-CM

## 2017-08-04 DIAGNOSIS — T451X5A Adverse effect of antineoplastic and immunosuppressive drugs, initial encounter: Secondary | ICD-10-CM

## 2017-08-04 DIAGNOSIS — Z87891 Personal history of nicotine dependence: Secondary | ICD-10-CM | POA: Diagnosis not present

## 2017-08-04 DIAGNOSIS — M7918 Myalgia, other site: Secondary | ICD-10-CM | POA: Diagnosis not present

## 2017-08-04 LAB — CBC WITH DIFFERENTIAL (CANCER CENTER ONLY)
Basophils Absolute: 0.1 10*3/uL (ref 0.0–0.1)
Basophils Relative: 1 %
Eosinophils Absolute: 0.2 10*3/uL (ref 0.0–0.5)
Eosinophils Relative: 2 %
HEMATOCRIT: 47.4 % (ref 38.4–49.9)
HEMOGLOBIN: 15.5 g/dL (ref 13.0–17.1)
LYMPHS ABS: 1.2 10*3/uL (ref 0.9–3.3)
LYMPHS PCT: 14 %
MCH: 27.7 pg (ref 27.2–33.4)
MCHC: 32.7 g/dL (ref 32.0–36.0)
MCV: 84.8 fL (ref 79.3–98.0)
MONO ABS: 0.5 10*3/uL (ref 0.1–0.9)
MONOS PCT: 5 %
NEUTROS ABS: 7.1 10*3/uL — AB (ref 1.5–6.5)
Neutrophils Relative %: 78 %
Platelet Count: 242 10*3/uL (ref 140–400)
RBC: 5.59 MIL/uL (ref 4.20–5.82)
RDW: 14.5 % (ref 11.0–14.6)
WBC Count: 9 10*3/uL (ref 4.0–10.3)

## 2017-08-04 LAB — CMP (CANCER CENTER ONLY)
ALK PHOS: 137 U/L (ref 40–150)
ALT: 10 U/L (ref 0–55)
ANION GAP: 10 (ref 3–11)
AST: 9 U/L (ref 5–34)
Albumin: 3.5 g/dL (ref 3.5–5.0)
BILIRUBIN TOTAL: 0.7 mg/dL (ref 0.2–1.2)
BUN: 16 mg/dL (ref 7–26)
CALCIUM: 9.5 mg/dL (ref 8.4–10.4)
CO2: 25 mmol/L (ref 22–29)
Chloride: 106 mmol/L (ref 98–109)
Creatinine: 0.99 mg/dL (ref 0.70–1.30)
GFR, Est AFR Am: >60 mL/min (ref >60)
Glucose, Bld: 135 mg/dL (ref 70–140)
POTASSIUM: 3.7 mmol/L (ref 3.5–5.1)
Sodium: 141 mmol/L (ref 136–145)
TOTAL PROTEIN: 6.9 g/dL (ref 6.4–8.3)

## 2017-08-04 MED ORDER — SODIUM CHLORIDE 0.9 % IV SOLN: 10.0000 mg | Freq: Once | INTRAVENOUS | Status: DC

## 2017-08-04 MED ORDER — CLONIDINE HCL 0.1 MG PO TABS
ORAL_TABLET | ORAL | Status: AC
  Filled 2017-08-04: qty 2

## 2017-08-04 MED ORDER — DEXAMETHASONE SODIUM PHOSPHATE 10 MG/ML IJ SOLN
INTRAMUSCULAR | Status: AC
  Filled 2017-08-04: qty 1

## 2017-08-04 MED ORDER — CLONIDINE HCL 0.1 MG PO TABS
0.2000 mg | ORAL_TABLET | Freq: Once | ORAL | Status: AC
  Administered 2017-08-04: 0.2 mg via ORAL

## 2017-08-04 MED ORDER — FAMOTIDINE IN NACL 20-0.9 MG/50ML-% IV SOLN
INTRAVENOUS | Status: AC
  Filled 2017-08-04: qty 50

## 2017-08-04 MED ORDER — SODIUM CHLORIDE 0.9% FLUSH
10.0000 mL | INTRAVENOUS | Status: DC | PRN
  Administered 2017-08-04: 10 mL
  Filled 2017-08-04: qty 10

## 2017-08-04 MED ORDER — DEXAMETHASONE SODIUM PHOSPHATE 10 MG/ML IJ SOLN
10.0000 mg | Freq: Once | INTRAMUSCULAR | Status: AC
  Administered 2017-08-04: 10 mg via INTRAVENOUS

## 2017-08-04 MED ORDER — SODIUM CHLORIDE 0.9 % IV SOLN
Freq: Once | INTRAVENOUS | Status: AC
  Administered 2017-08-04: 09:00:00 via INTRAVENOUS

## 2017-08-04 MED ORDER — HEPARIN SOD (PORK) LOCK FLUSH 100 UNIT/ML IV SOLN
500.0000 [IU] | Freq: Once | INTRAVENOUS | Status: AC | PRN
  Administered 2017-08-04: 500 [IU]
  Filled 2017-08-04: qty 5

## 2017-08-04 MED ORDER — FAMOTIDINE IN NACL 20-0.9 MG/50ML-% IV SOLN
20.0000 mg | Freq: Once | INTRAVENOUS | Status: AC | PRN
  Administered 2017-08-04: 20 mg via INTRAVENOUS

## 2017-08-04 MED ORDER — SODIUM CHLORIDE 0.9 % IV SOLN
75.0000 mg/m2 | Freq: Once | INTRAVENOUS | Status: AC
  Administered 2017-08-04: 180 mg via INTRAVENOUS
  Filled 2017-08-04: qty 18

## 2017-08-04 NOTE — Progress Notes (Signed)
[facility] Progress Note   [name] [id number] 10/28/1960 57 y.o.  [name] is managed by Dr. Lelan Pons Soberano presents for:  Chemotherapy:  yes       Monoclonal Antibody: no      Immunoglobulin: no      Bisphosphonate: no       Transfusion:  no     Current Therapy: Docetaxel  [name] was receiving docetaxel at the time of his reaction.  First dose of docetaxel: yes  Assessment: Plan:    Essential hypertension  Adverse effect of chemotherapy, initial encounter  [name] was seen in the infusion room for a suspected infusion reaction. He was receiving docetaxel at the time of his reaction. His symptoms included: Tingling of his arms and perioral tingling.  The patient was also noted to have hypertension with a blood pressure of 210/90. He was premedicated with dexamethasone 10 mg IV prior to starting his infusion. Docetaxel was paused and [name] was given clonidine 0.2 mg IV and Pepcid 20 mg IV after onset of his symptoms. [name] did  respond to intervention.  [name] was instructed to follow-up with his primary care provider.  He is previously been on an antihypertensive medication but is not currently taking any.  His blood pressure lowered to 160/85.  He was able to restart and complete his docetaxel infusion without further issues of concern.  Please see After Visit Summary for patient specific instructions.  Future Appointments  Date Time Provider [place]  08/07/2017  4:15 PM [facility] FLUSH NURSE 2 [facility] None  08/25/2017  8:30 AM [facility] ONLY [facility] None  08/25/2017  8:45 AM [facility] J32 DNS [facility] None  08/25/2017  9:15 AM Alen Blew, Mathis Dad, MD [facility] None  08/25/2017 10:15 AM [facility] G23 [facility] None  09/15/2017  8:45 AM [facility] LAB 2 [facility] None  09/15/2017  9:00 AM [facility] FLUSH NURSE 2 [facility] None  09/15/2017  9:30 AM Shadad, Mathis Dad, MD [facility] None    09/15/2017 10:15 AM [facility] C9 [facility] None    No orders of the defined types were placed in this encounter.      Subjective:   Patient ID:  [name] is a 57 y.o. (DOB 1961/03/23) male.  Chief Complaint: No chief complaint on file.   HPI [name] was seen in the infusion room for a suspected infusion reaction. He was receiving docetaxel at the time of his reaction. His symptoms included: Tingling of his arms and perioral tingling.  The patient was also noted to have hypertension with a blood pressure of 210/90. He was premedicated with dexamethasone 10 mg IV prior to starting his infusion. Docetaxel was paused and [name] was given clonidine 0.2 mg IV and Pepcid 20 mg IV after onset of his symptoms. [name] did  respond to intervention.  [name] was instructed to follow-up with his primary care provider.  He is previously been on an antihypertensive medication but is not currently taking any.  His blood pressure lowered to 160/85.  He was able to restart and complete his docetaxel infusion without further issues of concern.  Medications: I have reviewed the patient's current medications.  Allergies:  Allergies  Allergen Reactions  . Lanolin Itching and Rash    Past Medical History:  Diagnosis Date  . Cancer ([place])   . Hypertension     Past Surgical History:  Procedure Laterality Date  . IR FLUORO GUIDE PORT INSERTION RIGHT  07/28/2017  .  IR US GUIDE VASC ACCESS RIGHT  07/28/2017    No family history on file.  Social History   Socioeconomic History  . Marital status: Married    Spouse name: Not on file  . Number of children: Not on file  . Years of education: Not on file  . Highest education level: Not on file  Social Needs  . Financial resource strain: Not on file  . Food insecurity - worry: Not on file  . Food insecurity - inability: Not on file  . Transportation needs - medical: Not on file  . Transportation needs - non-medical: Not on file   Occupational History  . Not on file  Tobacco Use  . Smoking status: Former Research scientist (life sciences)  . Smokeless tobacco: Never Used  Substance and Sexual Activity  . Alcohol use: Not on file  . Drug use: Not on file  . Sexual activity: Not on file  Other Topics Concern  . Not on file  Social History Narrative  . Not on file    Past Medical History, Surgical history, Social history, and Family history were reviewed and updated as appropriate.   Please see review of systems for further details on the patient's review from today.   Review of Systems:  Review of Systems  Constitutional: Negative for diaphoresis.       Perioral tingling  HENT: Negative for trouble swallowing.   Eyes: Negative for visual disturbance.  Respiratory: Negative for cough, choking, chest tightness, shortness of breath and wheezing.   Cardiovascular: Negative for chest pain and palpitations.  Gastrointestinal: Negative for nausea and vomiting.  Neurological: Negative for dizziness, weakness, light-headedness and headaches.       Tingling of the bilateral upper extremities    Objective:   Physical Exam:  There were no vitals taken for this visit.  Physical Exam  Constitutional: No distress.  HENT:  Head: Normocephalic and atraumatic.  Cardiovascular: Normal rate, regular rhythm and normal heart sounds. Exam reveals no gallop and no friction rub.  No murmur heard. Pulmonary/Chest: Effort normal and breath sounds normal. No respiratory distress. He has no wheezes. He has no rales.  Musculoskeletal:  Upper extremity strength 5/5 bilaterally.  Neurological: He is alert.  Skin: Skin is warm and dry. No rash noted. He is not diaphoretic. No erythema.    Lab Review:     Component Value Date/Time   NA 141 08/04/2017 0742   NA 144 06/08/2017 1423   K 3.7 08/04/2017 0742   K 3.4 (L) 06/08/2017 1423   CL 106 08/04/2017 0742   CO2 25 08/04/2017 0742   CO2 24 06/08/2017 1423   GLUCOSE 135 08/04/2017 0742   GLUCOSE  91 06/08/2017 1423   BUN 16 08/04/2017 0742   BUN 16.1 06/08/2017 1423   CREATININE 0.99 08/04/2017 0742   CREATININE 1.0 06/08/2017 1423   CALCIUM 9.5 08/04/2017 0742   CALCIUM 9.6 06/08/2017 1423   PROT 6.9 08/04/2017 0742   PROT 7.8 06/08/2017 1423   ALBUMIN 3.5 08/04/2017 0742   ALBUMIN 4.1 06/08/2017 1423   AST 9 08/04/2017 0742   AST 17 06/08/2017 1423   ALT 10 08/04/2017 0742   ALT 12 06/08/2017 1423   ALKPHOS 137 08/04/2017 0742   ALKPHOS 128 06/08/2017 1423   BILITOT 0.7 08/04/2017 0742   BILITOT 0.74 06/08/2017 1423   GFRNONAA >60 08/04/2017 0742   GFRAA >60 08/04/2017 0742       Component Value Date/Time   WBC 9.0 08/04/2017 0742  WBC 5.5 07/28/2017 0746   RBC 5.59 08/04/2017 0742   HGB 15.9 07/28/2017 0746   HGB 16.5 06/08/2017 1423   HCT 47.4 08/04/2017 0742   HCT 49.5 06/08/2017 1423   PLT 242 08/04/2017 0742   PLT 267 06/08/2017 1423   MCV 84.8 08/04/2017 0742   MCV 86.2 06/08/2017 1423   MCH 27.7 08/04/2017 0742   MCHC 32.7 08/04/2017 0742   RDW 14.5 08/04/2017 0742   RDW 14.0 06/08/2017 1423   LYMPHSABS 1.2 08/04/2017 0742   LYMPHSABS 1.5 06/08/2017 1423   MONOABS 0.5 08/04/2017 0742   MONOABS 0.7 06/08/2017 1423   EOSABS 0.2 08/04/2017 0742   EOSABS 0.2 06/08/2017 1423   BASOSABS 0.1 08/04/2017 0742   BASOSABS 0.0 06/08/2017 1423   -------------------------------  Imaging from last 24 hours (if applicable):  Radiology interpretation: Ir US Guide Vasc Access Right  Result Date: 07/28/2017 INDICATION: 57 year old male with a history prostate carcinoma EXAM: IMPLANTED PORT A CATH PLACEMENT WITH ULTRASOUND AND FLUOROSCOPIC GUIDANCE MEDICATIONS: 2.0 g Ancef; The antibiotic was administered within an appropriate time interval prior to skin puncture. ANESTHESIA/SEDATION: Moderate (conscious) sedation was employed during this procedure. A total of Versed 3.0 mg and Fentanyl 100 mcg was administered intravenously. Moderate Sedation Time: 19 minutes.  The patient's level of consciousness and vital signs were monitored continuously by radiology nursing throughout the procedure under my direct supervision. FLUOROSCOPY TIME:  0 minutes, 6 seconds (1 mGy) COMPLICATIONS: None PROCEDURE: The procedure, risks, benefits, and alternatives were explained to the patient. Questions regarding the procedure were encouraged and answered. The patient understands and consents to the procedure. Ultrasound survey was performed with images stored and sent to PACs. The right neck and chest was prepped with chlorhexidine, and draped in the usual sterile fashion using maximum barrier technique (cap and mask, sterile gown, sterile gloves, large sterile sheet, hand hygiene and cutaneous antiseptic). Antibiotic prophylaxis was provided with 2.0g Ancef administered IV one hour prior to skin incision. Local anesthesia was attained by infiltration with 1% lidocaine without epinephrine. Ultrasound demonstrated patency of the right internal jugular vein, and this was documented with an image. Under real-time ultrasound guidance, this vein was accessed with a 21 gauge micropuncture needle and image documentation was performed. A small dermatotomy was made at the access site with an 11 scalpel. A 0.018" wire was advanced into the SVC and used to estimate the length of the internal catheter. The access needle exchanged for a 16F micropuncture vascular sheath. The 0.018" wire was then removed and a 0.035" wire advanced into the IVC. An appropriate location for the subcutaneous reservoir was selected below the clavicle and an incision was made through the skin and underlying soft tissues. The subcutaneous tissues were then dissected using a combination of blunt and sharp surgical technique and a pocket was formed. A single lumen power injectable portacatheter was then tunneled through the subcutaneous tissues from the pocket to the dermatotomy and the port reservoir placed within the subcutaneous  pocket. The venous access site was then serially dilated and a peel away vascular sheath placed over the wire. The wire was removed and the port catheter advanced into position under fluoroscopic guidance. The catheter tip is positioned in the cavoatrial junction. This was documented with a spot image. The portacatheter was then tested and found to flush and aspirate well. The port was flushed with saline followed by 100 units/mL heparinized saline. The pocket was then closed in two layers using first subdermal inverted interrupted absorbable sutures  followed by a running subcuticular suture. The epidermis was then sealed with Dermabond. The dermatotomy at the venous access site was also seal with Dermabond. Patient tolerated the procedure well and remained hemodynamically stable throughout. No complications encountered and no significant blood loss encountered IMPRESSION: Status post right IJ port catheter placement. Signed, Dulcy Fanny. Earleen Newport, DO [facility] Radiology Electronically Signed   By: Corrie Mckusick D.O.   On: 07/28/2017 10:57   Ir Fluoro Guide Port Insertion Right  Result Date: 07/28/2017 INDICATION: 57 year old male with a history prostate carcinoma EXAM: IMPLANTED PORT A CATH PLACEMENT WITH ULTRASOUND AND FLUOROSCOPIC GUIDANCE MEDICATIONS: 2.0 g Ancef; The antibiotic was administered within an appropriate time interval prior to skin puncture. ANESTHESIA/SEDATION: Moderate (conscious) sedation was employed during this procedure. A total of Versed 3.0 mg and Fentanyl 100 mcg was administered intravenously. Moderate Sedation Time: 19 minutes. The patient's level of consciousness and vital signs were monitored continuously by radiology nursing throughout the procedure under my direct supervision. FLUOROSCOPY TIME:  0 minutes, 6 seconds (1 mGy) COMPLICATIONS: None PROCEDURE: The procedure, risks, benefits, and alternatives were explained to the patient.  Questions regarding the procedure were encouraged and answered. The patient understands and consents to the procedure. Ultrasound survey was performed with images stored and sent to PACs. The right neck and chest was prepped with chlorhexidine, and draped in the usual sterile fashion using maximum barrier technique (cap and mask, sterile gown, sterile gloves, large sterile sheet, hand hygiene and cutaneous antiseptic). Antibiotic prophylaxis was provided with 2.0g Ancef administered IV one hour prior to skin incision. Local anesthesia was attained by infiltration with 1% lidocaine without epinephrine. Ultrasound demonstrated patency of the right internal jugular vein, and this was documented with an image. Under real-time ultrasound guidance, this vein was accessed with a 21 gauge micropuncture needle and image documentation was performed. A small dermatotomy was made at the access site with an 11 scalpel. A 0.018" wire was advanced into the SVC and used to estimate the length of the internal catheter. The access needle exchanged for a 62F micropuncture vascular sheath. The 0.018" wire was then removed and a 0.035" wire advanced into the IVC. An appropriate location for the subcutaneous reservoir was selected below the clavicle and an incision was made through the skin and underlying soft tissues. The subcutaneous tissues were then dissected using a combination of blunt and sharp surgical technique and a pocket was formed. A single lumen power injectable portacatheter was then tunneled through the subcutaneous tissues from the pocket to the dermatotomy and the port reservoir placed within the subcutaneous pocket. The venous access site was then serially dilated and a peel away vascular sheath placed over the wire. The wire was removed and the port catheter advanced into position under fluoroscopic guidance. The catheter tip is positioned in the cavoatrial junction. This was documented with a spot image. The  portacatheter was then tested and found to flush and aspirate well. The port was flushed with saline followed by 100 units/mL heparinized saline. The pocket was then closed in two layers using first subdermal inverted interrupted absorbable sutures followed by a running subcuticular suture. The epidermis was then sealed with Dermabond. The dermatotomy at the venous access site was also seal with Dermabond. Patient tolerated the procedure well and remained hemodynamically stable throughout. No complications encountered and no significant blood loss encountered IMPRESSION: Status post right IJ port catheter placement. Signed, Dulcy Fanny. Earleen Newport, DO [facility] Radiology  Electronically Signed   By: Corrie Mckusick D.O.   On: 07/28/2017 10:57

## 2017-08-04 NOTE — Patient Instructions (Addendum)
[facility] Discharge Instructions for Patients Receiving Chemotherapy  Today you received the following chemotherapy agents Taxotere  To help prevent nausea and vomiting after your treatment, we encourage you to take your nausea medication as directed   If you develop nausea and vomiting that is not controlled by your nausea medication, call the clinic.   BELOW ARE SYMPTOMS THAT SHOULD BE REPORTED IMMEDIATELY:  *FEVER GREATER THAN 100.5 F  *CHILLS WITH OR WITHOUT FEVER  NAUSEA AND VOMITING THAT IS NOT CONTROLLED WITH YOUR NAUSEA MEDICATION  *UNUSUAL SHORTNESS OF BREATH  *UNUSUAL BRUISING OR BLEEDING  TENDERNESS IN MOUTH AND THROAT WITH OR WITHOUT PRESENCE OF ULCERS  *URINARY PROBLEMS  *BOWEL PROBLEMS  UNUSUAL RASH Items with * indicate a potential emergency and should be followed up as soon as possible.  Feel free to call the clinic should you have any questions or concerns. The clinic phone number is [phone].  Please show the CHEMO ALERT CARD at check-in to the Emergency Department and triage nurse.   Docetaxel injection What is this medicine? DOCETAXEL (doe se TAX el) is a chemotherapy drug. It targets fast dividing cells, like cancer cells, and causes these cells to die. This medicine is used to treat many types of cancers like breast cancer, certain stomach cancers, head and neck cancer, lung cancer, and prostate cancer. This medicine may be used for other purposes; ask your health care provider or pharmacist if you have questions. COMMON BRAND NAME(S): Docefrez, Taxotere What should I tell my health care provider before I take this medicine? They need to know if you have any of these conditions: -infection (especially a virus infection such as chickenpox, cold sores, or herpes) -liver disease -low blood counts, like low white cell, platelet, or red cell counts -an unusual or allergic reaction to docetaxel, polysorbate 80, other chemotherapy  agents, other medicines, foods, dyes, or preservatives -pregnant or trying to get pregnant -breast-feeding How should I use this medicine? This drug is given as an infusion into a vein. It is administered in a hospital or clinic by a specially trained health care professional. Talk to your pediatrician regarding the use of this medicine in children. Special care may be needed. Overdosage: If you think you have taken too much of this medicine contact a poison control center or emergency room at once. NOTE: This medicine is only for you. Do not share this medicine with others. What if I miss a dose? It is important not to miss your dose. Call your doctor or health care professional if you are unable to keep an appointment. What may interact with this medicine? -cyclosporine -erythromycin -ketoconazole -medicines to increase blood counts like filgrastim, pegfilgrastim, sargramostim -vaccines Talk to your doctor or health care professional before taking any of these medicines: -acetaminophen -aspirin -ibuprofen -ketoprofen -naproxen This list may not describe all possible interactions. Give your health care provider a list of all the medicines, herbs, non-prescription drugs, or dietary supplements you use. Also tell them if you smoke, drink alcohol, or use illegal drugs. Some items may interact with your medicine. What should I watch for while using this medicine? Your condition will be monitored carefully while you are receiving this medicine. You will need important blood work done while you are taking this medicine. This drug may make you feel generally unwell. This is not uncommon, as chemotherapy can affect healthy cells as well as cancer cells. Report any side effects. Continue your course of treatment even though you feel ill   unless your doctor tells you to stop. In some cases, you may be given additional medicines to help with side effects. Follow all directions for their use. Call  your doctor or health care professional for advice if you get a fever, chills or sore throat, or other symptoms of a cold or flu. Do not treat yourself. This drug decreases your body's ability to fight infections. Try to avoid being around people who are sick. This medicine may increase your risk to bruise or bleed. Call your doctor or health care professional if you notice any unusual bleeding. This medicine may contain alcohol in the product. You may get drowsy or dizzy. Do not drive, use machinery, or do anything that needs mental alertness until you know how this medicine affects you. Do not stand or sit up quickly, especially if you are an older patient. This reduces the risk of dizzy or fainting spells. Avoid alcoholic drinks. Do not become pregnant while taking this medicine. Women should inform their doctor if they wish to become pregnant or think they might be pregnant. There is a potential for serious side effects to an unborn child. Talk to your health care professional or pharmacist for more information. Do not breast-feed an infant while taking this medicine. What side effects may I notice from receiving this medicine? Side effects that you should report to your doctor or health care professional as soon as possible: -allergic reactions like skin rash, itching or hives, swelling of the face, lips, or tongue -low blood counts - This drug may decrease the number of white blood cells, red blood cells and platelets. You may be at increased risk for infections and bleeding. -signs of infection - fever or chills, cough, sore throat, pain or difficulty passing urine -signs of decreased platelets or bleeding - bruising, pinpoint red spots on the skin, black, tarry stools, nosebleeds -signs of decreased red blood cells - unusually weak or tired, fainting spells, lightheadedness -breathing problems -fast or irregular heartbeat -low blood pressure -mouth sores -nausea and vomiting -pain, swelling,  redness or irritation at the injection site -pain, tingling, numbness in the hands or feet -swelling of the ankle, feet, hands -weight gain Side effects that usually do not require medical attention (report to your doctor or health care professional if they continue or are bothersome): -bone pain -complete hair loss including hair on your head, underarms, pubic hair, eyebrows, and eyelashes -diarrhea -excessive tearing -changes in the color of fingernails -loosening of the fingernails -nausea -muscle pain -red flush to skin -sweating -weak or tired This list may not describe all possible side effects. Call your doctor for medical advice about side effects. You may report side effects to FDA at 1-800-FDA-1088. Where should I keep my medicine? This drug is given in a hospital or clinic and will not be stored at home. NOTE: This sheet is a summary. It may not cover all possible information. If you have questions about this medicine, talk to your doctor, pharmacist, or health care provider.  2018 [employer]/Gold Standard (2015-07-16 12:32:56)  

## 2017-08-04 NOTE — Progress Notes (Signed)
Pre-tx BP elevated. Dr. Alen Blew aware. Advised ok to treat.   During docetaxel infusion, patient reported, "Both of my arms and my face feel like I've been to the dentist." Denied pain. Infusion paused. Sandi Mealy, PA-C saw patient in the infusion room. Medicated per MAR. BP improved and symptoms resolved. Infusion resumed without further incident.

## 2017-08-05 LAB — PROSTATE-SPECIFIC AG, SERUM (LABCORP): Prostate Specific Ag, Serum: 43.9 ng/mL — ABNORMAL HIGH (ref 0.0–4.0)

## 2017-08-07 ENCOUNTER — Inpatient Hospital Stay: Payer: 59

## 2017-08-07 ENCOUNTER — Telehealth: Payer: Self-pay | Admitting: *Deleted

## 2017-08-07 ENCOUNTER — Inpatient Hospital Stay (HOSPITAL_BASED_OUTPATIENT_CLINIC_OR_DEPARTMENT_OTHER): Payer: 59 | Admitting: Medical

## 2017-08-07 VITALS — BP 161/89 | HR 85 | Temp 98.4°F | Resp 20

## 2017-08-07 DIAGNOSIS — M7918 Myalgia, other site: Secondary | ICD-10-CM | POA: Diagnosis not present

## 2017-08-07 DIAGNOSIS — C61 Malignant neoplasm of prostate: Secondary | ICD-10-CM

## 2017-08-07 DIAGNOSIS — Z79899 Other long term (current) drug therapy: Secondary | ICD-10-CM | POA: Diagnosis not present

## 2017-08-07 DIAGNOSIS — I10 Essential (primary) hypertension: Secondary | ICD-10-CM | POA: Diagnosis not present

## 2017-08-07 DIAGNOSIS — C7951 Secondary malignant neoplasm of bone: Secondary | ICD-10-CM | POA: Diagnosis not present

## 2017-08-07 MED ORDER — HYDROCODONE-ACETAMINOPHEN 5-325 MG PO TABS: ORAL_TABLET | ORAL | 0 refills | Status: DC

## 2017-08-07 MED ORDER — PEGFILGRASTIM-CBQV 6 MG/0.6ML ~~LOC~~ SOSY
6.0000 mg | PREFILLED_SYRINGE | Freq: Once | SUBCUTANEOUS | Status: AC
  Administered 2017-08-07: 6 mg via SUBCUTANEOUS
  Filled 2017-08-07: qty 0.6

## 2017-08-07 NOTE — Telephone Encounter (Signed)
Spoke with patient when he came to get his Neulasta injection today regarding first time chemotherapy follow up. Patient stated, "my only problem is that I have been having increased joint pain, and on my right side a stabbing feeling. Tylenol 500 mg is not touching the pain. I stayed up all night moaning and crying." Instructed patient to stay after his injection and I would get him in to Symptom Management clinic since, Dr. Alen Blew is out of the office.

## 2017-08-07 NOTE — Patient Instructions (Signed)
Pegfilgrastim injection What is this medicine? PEGFILGRASTIM (PEG fil gra stim) is a long-acting granulocyte colony-stimulating factor that stimulates the growth of neutrophils, a type of white blood cell important in the body's fight against infection. It is used to reduce the incidence of fever and infection in patients with certain types of cancer who are receiving chemotherapy that affects the bone marrow, and to increase survival after being exposed to high doses of radiation. This medicine may be used for other purposes; ask your health care provider or pharmacist if you have questions. COMMON BRAND NAME(S): Neulasta What should I tell my health care provider before I take this medicine? They need to know if you have any of these conditions: -kidney disease -latex allergy -ongoing radiation therapy -sickle cell disease -skin reactions to acrylic adhesives (On-Body Injector only) -an unusual or allergic reaction to pegfilgrastim, filgrastim, other medicines, foods, dyes, or preservatives -pregnant or trying to get pregnant -breast-feeding How should I use this medicine? This medicine is for injection under the skin. If you get this medicine at home, you will be taught how to prepare and give the pre-filled syringe or how to use the On-body Injector. Refer to the patient Instructions for Use for detailed instructions. Use exactly as directed. Tell your healthcare provider immediately if you suspect that the On-body Injector may not have performed as intended or if you suspect the use of the On-body Injector resulted in a missed or partial dose. It is important that you put your used needles and syringes in a special sharps container. Do not put them in a trash can. If you do not have a sharps container, call your pharmacist or healthcare provider to get one. Talk to your pediatrician regarding the use of this medicine in children. While this drug may be prescribed for selected conditions,  precautions do apply. Overdosage: If you think you have taken too much of this medicine contact a poison control center or emergency room at once. NOTE: This medicine is only for you. Do not share this medicine with others. What if I miss a dose? It is important not to miss your dose. Call your doctor or health care professional if you miss your dose. If you miss a dose due to an On-body Injector failure or leakage, a new dose should be administered as soon as possible using a single prefilled syringe for manual use. What may interact with this medicine? Interactions have not been studied. Give your health care provider a list of all the medicines, herbs, non-prescription drugs, or dietary supplements you use. Also tell them if you smoke, drink alcohol, or use illegal drugs. Some items may interact with your medicine. This list may not describe all possible interactions. Give your health care provider a list of all the medicines, herbs, non-prescription drugs, or dietary supplements you use. Also tell them if you smoke, drink alcohol, or use illegal drugs. Some items may interact with your medicine. What should I watch for while using this medicine? You may need blood work done while you are taking this medicine. If you are going to need a MRI, CT scan, or other procedure, tell your doctor that you are using this medicine (On-Body Injector only). What side effects may I notice from receiving this medicine? Side effects that you should report to your doctor or health care professional as soon as possible: -allergic reactions like skin rash, itching or hives, swelling of the face, lips, or tongue -dizziness -fever -pain, redness, or irritation at site   where injected -pinpoint red spots on the skin -red or dark-brown urine -shortness of breath or breathing problems -stomach or side pain, or pain at the shoulder -swelling -tiredness -trouble passing urine or change in the amount of urine Side  effects that usually do not require medical attention (report to your doctor or health care professional if they continue or are bothersome): -bone pain -muscle pain This list may not describe all possible side effects. Call your doctor for medical advice about side effects. You may report side effects to FDA at 1-800-FDA-1088. Where should I keep my medicine? Keep out of the reach of children. Store pre-filled syringes in a refrigerator between 2 and 8 degrees C (36 and 46 degrees F). Do not freeze. Keep in carton to protect from light. Throw away this medicine if it is left out of the refrigerator for more than 48 hours. Throw away any unused medicine after the expiration date. NOTE: This sheet is a summary. It may not cover all possible information. If you have questions about this medicine, talk to your doctor, pharmacist, or health care provider.  2018 [employer]vier/Gold Standard (2016-06-09 12:58:03)  

## 2017-08-07 NOTE — Telephone Encounter (Signed)
-----   Message from Sinda Du, RN sent at 08/04/2017  8:25 AM EST ----- Regarding: Dr. Alen Blew - 1st chemo f/u 1st chemo f/u

## 2017-08-08 ENCOUNTER — Telehealth: Payer: Self-pay | Admitting: *Deleted

## 2017-08-08 NOTE — Progress Notes (Signed)
[facility] Progress Note   [name] [id number] [birth year] 57 y.o.  [name] is managed by Dr. Alen Blew  Actively treated with chemotherapy: yes  Current Therapy:  Docetaxel with pegfilgrastim support  Last Treated: 08/04/2017 (cycle 1, day 1)  Assessment: Plan:    Musculoskeletal pain - Plan: HYDROcodone-acetaminophen (NORCO) 5-325 MG tablet  Prostate cancer ([place])   Musculoskeletal pain in the setting of metastatic prostate cancer: The patient was given prescription for Norco 5-325, 1/2 to 1 tablet every 4 hours as needed pain.  He is status post cycle 1, day 1 of single agent docetaxel which was dosed on 08/04/2017.  He received pegfilgrastim 6 mg subcu today.  Review of the patient's chart shows that his PSA from 08/04/2017 returned at 43.9.  This was up from 38.4 on 07/16/2017. His PSA was 23.0 on 06/08/2017.  Review of his chart shows that his most recent bone scan was completed on 03/21/2017.  I will asked Dr. Alen Blew if it would be appropriate to refer the patient for a repeat bone scan now given his higher PSA and increased musculoskeletal pain.  Please see After Visit Summary for patient specific instructions.  Future Appointments  Date Time Provider [name]  08/25/2017  8:30 AM [facility] ONLY [facility] None  08/25/2017  8:45 AM [facility] J32 DNS [facility] None  08/25/2017  9:15 AM [name], MD [facility] None  08/25/2017 10:15 AM [facility] G23 [facility] None  09/15/2017  8:45 AM [facility] LAB 2 [facility] None  09/15/2017  9:00 AM [facility] FLUSH NURSE 2 [facility] None  09/15/2017  9:30 AM Shadad, Mathis Dad, MD [facility] None  09/15/2017 10:15 AM [facility] C9 [facility] None    No orders of the defined types were placed in this encounter.      Subjective:   Patient ID:  [name] is a 57 y.o. (DOB Oct 29, 1960) male.  Chief Complaint:  Chief Complaint  Patient presents with  . 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  He was originally diagnosed in 2012 [name] has been treated with Casodex which was followed by [name].  [name] was discontinued in September 2018 due to disease progression.  He was then begun on Zytiga in October 2018.  This therapy was discontinued in January 2019 due to a poor response.  He continues on Lupron 30 mg every 4 months.  He began his most recent therapy with single agent docetaxel which was given on 08/04/2017.  He received pegfilgrastim on 08/07/2017.  He presents to the office today stating that he has had increased pain in his right knee bilateral hips and right ankle since yesterday.  He could not sleep last night due to the pain.  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  He took off of work today due to his pain.  He has been taking Tylenol 500 mg, 2 tablets 3 times daily without relief of his pain.  Medications: I have reviewed the patient's current medications.  Allergies:  Allergies  Allergen Reactions  . Lanolin Itching and [name]    Past Medical History:  Diagnosis Date  . Cancer ([place])   . Hypertension     Past Surgical History:  Procedure Laterality Date  . IR FLUORO GUIDE PORT INSERTION RIGHT  07/28/2017  . IR US GUIDE VASC ACCESS RIGHT  07/28/2017  No family history on file.  Social History   Socioeconomic History  . Marital status: Married    Spouse name: Not on file  . Number of children: Not on file  . Years of education: Not on file  . Highest education level: Not on file  Social Needs  . Financial resource strain: Not on file  . Food insecurity - worry: Not on file  . Food insecurity - inability: Not on file  . Transportation needs - medical: Not on file  . Transportation needs - non-medical: Not on file  Occupational History  . Not on file  Tobacco Use  . Smoking status: Former Research scientist (life sciences)  .  Smokeless tobacco: Never Used  Substance and Sexual Activity  . Alcohol use: Not on file  . Drug use: Not on file  . Sexual activity: Not on file  Other Topics Concern  . Not on file  Social History Narrative  . Not on file    Past Medical History, Surgical history, Social history, and Family history were reviewed and updated as appropriate.   Please see review of systems for further details on the patient's review from today.   Review of Systems:  Review of Systems  Constitutional: Negative for activity change, chills, diaphoresis and fever.  Musculoskeletal: Positive for arthralgias.    Objective:   Physical Exam:  BP (!) 161/89 (BP Location: Left Arm, Patient Position: Sitting)   Pulse 85   Temp 98.4 F (36.9 C) (Oral)   Resp 20   SpO2 98%  ECOG: 1  Physical Exam  Constitutional: No distress.  HENT:  Head: Normocephalic and atraumatic.  Cardiovascular: Normal rate, regular rhythm and normal heart sounds. Exam reveals no friction rub.  No murmur heard. Pulmonary/Chest: Effort normal and breath sounds normal. No respiratory distress. He has no wheezes. He has no rales.  Abdominal: Soft. Bowel sounds are normal. He exhibits no distension. There is no tenderness. There is no rebound and no guarding.  Musculoskeletal: He exhibits tenderness. He exhibits no edema.  Calves are bilaterally equal at 47 cm at 15 cm distal to the inferior pole of the patella. Right knee is tender with extension.  No crepitus, effusion, or deformity is noted. Straight leg raises are negative bilaterally.  Neurological: He is alert. Coordination normal.  Skin: Skin is warm and dry. No [name] noted. He is not diaphoretic. No erythema.    Lab Review:     Component Value Date/Time   NA 141 08/04/2017 0742   NA 144 06/08/2017 1423   K 3.7 08/04/2017 0742   K 3.4 (L) 06/08/2017 1423   CL 106 08/04/2017 0742   CO2 25 08/04/2017 0742   CO2 24 06/08/2017 1423   GLUCOSE 135 08/04/2017 0742    GLUCOSE 91 06/08/2017 1423   BUN 16 08/04/2017 0742   BUN 16.1 06/08/2017 1423   CREATININE 0.99 08/04/2017 0742   CREATININE 1.0 06/08/2017 1423   CALCIUM 9.5 08/04/2017 0742   CALCIUM 9.6 06/08/2017 1423   PROT 6.9 08/04/2017 0742   PROT 7.8 06/08/2017 1423   ALBUMIN 3.5 08/04/2017 0742   ALBUMIN 4.1 06/08/2017 1423   AST 9 08/04/2017 0742   AST 17 06/08/2017 1423   ALT 10 08/04/2017 0742   ALT 12 06/08/2017 1423   ALKPHOS 137 08/04/2017 0742   ALKPHOS 128 06/08/2017 1423   BILITOT 0.7 08/04/2017 0742   BILITOT 0.74 06/08/2017 1423   GFRNONAA >60 08/04/2017 0742   GFRAA >60 08/04/2017 7517  Component Value Date/Time   WBC 9.0 08/04/2017 0742   WBC 5.5 07/28/2017 0746   RBC 5.59 08/04/2017 0742   HGB 15.9 07/28/2017 0746   HGB 16.5 06/08/2017 1423   HCT 47.4 08/04/2017 0742   HCT 49.5 06/08/2017 1423   PLT 242 08/04/2017 0742   PLT 267 06/08/2017 1423   MCV 84.8 08/04/2017 0742   MCV 86.2 06/08/2017 1423   MCH 27.7 08/04/2017 0742   MCHC 32.7 08/04/2017 0742   RDW 14.5 08/04/2017 0742   RDW 14.0 06/08/2017 1423   LYMPHSABS 1.2 08/04/2017 0742   LYMPHSABS 1.5 06/08/2017 1423   MONOABS 0.5 08/04/2017 0742   MONOABS 0.7 06/08/2017 1423   EOSABS 0.2 08/04/2017 0742   EOSABS 0.2 06/08/2017 1423   BASOSABS 0.1 08/04/2017 0742   BASOSABS 0.0 06/08/2017 1423   -------------------------------  Imaging from last 24 hours (if applicable):  Radiology interpretation: Ir US Guide Vasc Access Right  Result Date: 07/28/2017 INDICATION: 57 year old male with a history prostate carcinoma EXAM: IMPLANTED PORT A CATH PLACEMENT WITH ULTRASOUND AND FLUOROSCOPIC GUIDANCE MEDICATIONS: 2.0 g Ancef; The antibiotic was administered within an appropriate time interval prior to skin puncture. ANESTHESIA/SEDATION: Moderate (conscious) sedation was employed during this procedure. A total of Versed 3.0 mg and Fentanyl 100 mcg was administered intravenously. Moderate Sedation Time: 19  minutes. The patient's level of consciousness and vital signs were monitored continuously by radiology nursing throughout the procedure under my direct supervision. FLUOROSCOPY TIME:  0 minutes, 6 seconds (1 mGy) COMPLICATIONS: None PROCEDURE: The procedure, risks, benefits, and alternatives were explained to the patient. Questions regarding the procedure were encouraged and answered. The patient understands and consents to the procedure. Ultrasound survey was performed with images stored and sent to PACs. The right neck and chest was prepped with chlorhexidine, and draped in the usual sterile fashion using maximum barrier technique (cap and mask, sterile gown, sterile gloves, large sterile sheet, hand hygiene and cutaneous antiseptic). Antibiotic prophylaxis was provided with 2.0g Ancef administered IV one hour prior to skin incision. Local anesthesia was attained by infiltration with 1% lidocaine without epinephrine. Ultrasound demonstrated patency of the right internal jugular vein, and this was documented with an image. Under real-time ultrasound guidance, this vein was accessed with a 21 gauge micropuncture needle and image documentation was performed. A small dermatotomy was made at the access site with an 11 scalpel. A 0.018" wire was advanced into the SVC and used to estimate the length of the internal catheter. The access needle exchanged for a 43F micropuncture vascular sheath. The 0.018" wire was then removed and a 0.035" wire advanced into the IVC. An appropriate location for the subcutaneous reservoir was selected below the clavicle and an incision was made through the skin and underlying soft tissues. The subcutaneous tissues were then dissected using a combination of blunt and sharp surgical technique and a pocket was formed. A single lumen power injectable portacatheter was then tunneled through the subcutaneous tissues from the pocket to the dermatotomy and the port reservoir placed within the  subcutaneous pocket. The venous access site was then serially dilated and a peel away vascular sheath placed over the wire. The wire was removed and the port catheter advanced into position under fluoroscopic guidance. The catheter tip is positioned in the cavoatrial junction. This was documented with a spot image. The portacatheter was then tested and found to flush and aspirate well. The port was flushed with saline followed by 100 units/mL heparinized saline. The pocket was then  closed in two layers using first subdermal inverted interrupted absorbable sutures followed by a running subcuticular suture. The epidermis was then sealed with Dermabond. The dermatotomy at the venous access site was also seal with Dermabond. Patient tolerated the procedure well and remained hemodynamically stable throughout. No complications encountered and no significant blood loss encountered IMPRESSION: Status post right IJ port catheter placement. Signed, Dulcy Fanny. Earleen Newport, DO [facility] Radiology Electronically Signed   By: Corrie Mckusick D.O.   On: 07/28/2017 10:57   Ir Fluoro Guide Port Insertion Right  Result Date: 07/28/2017 INDICATION: 57 year old male with a history prostate carcinoma EXAM: IMPLANTED PORT A CATH PLACEMENT WITH ULTRASOUND AND FLUOROSCOPIC GUIDANCE MEDICATIONS: 2.0 g Ancef; The antibiotic was administered within an appropriate time interval prior to skin puncture. ANESTHESIA/SEDATION: Moderate (conscious) sedation was employed during this procedure. A total of Versed 3.0 mg and Fentanyl 100 mcg was administered intravenously. Moderate Sedation Time: 19 minutes. The patient's level of consciousness and vital signs were monitored continuously by radiology nursing throughout the procedure under my direct supervision. FLUOROSCOPY TIME:  0 minutes, 6 seconds (1 mGy) COMPLICATIONS: None PROCEDURE: The procedure, risks, benefits, and alternatives were explained to the  patient. Questions regarding the procedure were encouraged and answered. The patient understands and consents to the procedure. Ultrasound survey was performed with images stored and sent to PACs. The right neck and chest was prepped with chlorhexidine, and draped in the usual sterile fashion using maximum barrier technique (cap and mask, sterile gown, sterile gloves, large sterile sheet, hand hygiene and cutaneous antiseptic). Antibiotic prophylaxis was provided with 2.0g Ancef administered IV one hour prior to skin incision. Local anesthesia was attained by infiltration with 1% lidocaine without epinephrine. Ultrasound demonstrated patency of the right internal jugular vein, and this was documented with an image. Under real-time ultrasound guidance, this vein was accessed with a 21 gauge micropuncture needle and image documentation was performed. A small dermatotomy was made at the access site with an 11 scalpel. A 0.018" wire was advanced into the SVC and used to estimate the length of the internal catheter. The access needle exchanged for a 34F micropuncture vascular sheath. The 0.018" wire was then removed and a 0.035" wire advanced into the IVC. An appropriate location for the subcutaneous reservoir was selected below the clavicle and an incision was made through the skin and underlying soft tissues. The subcutaneous tissues were then dissected using a combination of blunt and sharp surgical technique and a pocket was formed. A single lumen power injectable portacatheter was then tunneled through the subcutaneous tissues from the pocket to the dermatotomy and the port reservoir placed within the subcutaneous pocket. The venous access site was then serially dilated and a peel away vascular sheath placed over the wire. The wire was removed and the port catheter advanced into position under fluoroscopic guidance. The catheter tip is positioned in the cavoatrial junction. This was documented with a spot image. The  portacatheter was then tested and found to flush and aspirate well. The port was flushed with saline followed by 100 units/mL heparinized saline. The pocket was then closed in two layers using first subdermal inverted interrupted absorbable sutures followed by a running subcuticular suture. The epidermis was then sealed with Dermabond. The dermatotomy at the venous access site was also seal with Dermabond. Patient tolerated the procedure well and remained hemodynamically stable throughout. No complications encountered and no significant blood loss encountered IMPRESSION: Status post right IJ port catheter placement. Signed,  Dulcy Fanny. Earleen Newport, DO [facility] Electronically Signed   By: Corrie Mckusick D.O.   On: 07/28/2017 10:57

## 2017-08-08 NOTE — Telephone Encounter (Signed)
Wife worried re: rising PSA and would like a CT scan done. Per dr Alen Blew, he wants to continue with treatment of docetaxel first. Encouraged her to keep appt for 08/25/17 to discuss further with dr Alen Blew.

## 2017-08-08 NOTE — Telephone Encounter (Signed)
No note

## 2017-08-23 ENCOUNTER — Other Ambulatory Visit: Payer: Self-pay | Admitting: Oncology

## 2017-08-23 ENCOUNTER — Encounter: Payer: Self-pay | Admitting: *Deleted

## 2017-08-23 NOTE — Progress Notes (Signed)
fmla papers from fax machine put into tiffany daniels box

## 2017-08-25 ENCOUNTER — Inpatient Hospital Stay: Payer: 59

## 2017-08-25 ENCOUNTER — Inpatient Hospital Stay: Payer: 59 | Attending: Oncology

## 2017-08-25 ENCOUNTER — Inpatient Hospital Stay (HOSPITAL_BASED_OUTPATIENT_CLINIC_OR_DEPARTMENT_OTHER): Payer: 59 | Admitting: Oncology

## 2017-08-25 ENCOUNTER — Telehealth: Payer: Self-pay | Admitting: Oncology

## 2017-08-25 VITALS — BP 136/85 | HR 70 | Temp 99.1°F | Resp 18 | Ht 70.0 in | Wt 254.5 lb

## 2017-08-25 DIAGNOSIS — Z95828 Presence of other vascular implants and grafts: Secondary | ICD-10-CM

## 2017-08-25 DIAGNOSIS — C61 Malignant neoplasm of prostate: Secondary | ICD-10-CM | POA: Diagnosis not present

## 2017-08-25 DIAGNOSIS — Z7689 Persons encountering health services in other specified circumstances: Secondary | ICD-10-CM | POA: Insufficient documentation

## 2017-08-25 DIAGNOSIS — Z5111 Encounter for antineoplastic chemotherapy: Secondary | ICD-10-CM | POA: Insufficient documentation

## 2017-08-25 DIAGNOSIS — Z79899 Other long term (current) drug therapy: Secondary | ICD-10-CM | POA: Insufficient documentation

## 2017-08-25 DIAGNOSIS — C7951 Secondary malignant neoplasm of bone: Secondary | ICD-10-CM

## 2017-08-25 LAB — CMP (CANCER CENTER ONLY)
ALBUMIN: 3.5 g/dL (ref 3.5–5.0)
ALK PHOS: 103 U/L (ref 40–150)
ALT: 17 U/L (ref 0–55)
ANION GAP: 10 (ref 3–11)
AST: 8 U/L (ref 5–34)
BUN: 18 mg/dL (ref 7–26)
CO2: 26 mmol/L (ref 22–29)
Calcium: 9.6 mg/dL (ref 8.4–10.4)
Chloride: 106 mmol/L (ref 98–109)
Creatinine: 1.06 mg/dL (ref 0.70–1.30)
GFR, Est AFR Am: >60 mL/min (ref >60)
GFR, Estimated: >60 mL/min (ref >60)
GLUCOSE: 102 mg/dL (ref 70–140)
POTASSIUM: 3.9 mmol/L (ref 3.5–5.1)
SODIUM: 142 mmol/L (ref 136–145)
Total Bilirubin: 0.7 mg/dL (ref 0.2–1.2)
Total Protein: 6.7 g/dL (ref 6.4–8.3)

## 2017-08-25 LAB — CBC WITH DIFFERENTIAL (CANCER CENTER ONLY)
Basophils Absolute: 0.1 10*3/uL (ref 0.0–0.1)
Basophils Relative: 1 %
EOS PCT: 0 %
Eosinophils Absolute: 0 10*3/uL (ref 0.0–0.5)
HEMATOCRIT: 42.9 % (ref 38.4–49.9)
Hemoglobin: 14.3 g/dL (ref 13.0–17.1)
LYMPHS PCT: 12 %
Lymphs Abs: 1.1 10*3/uL (ref 0.9–3.3)
MCH: 27.9 pg (ref 27.2–33.4)
MCHC: 33.4 g/dL (ref 32.0–36.0)
MCV: 83.4 fL (ref 79.3–98.0)
MONO ABS: 0.9 10*3/uL (ref 0.1–0.9)
MONOS PCT: 9 %
NEUTROS ABS: 7.7 10*3/uL — AB (ref 1.5–6.5)
Neutrophils Relative %: 78 %
Platelet Count: 380 10*3/uL (ref 140–400)
RBC: 5.15 MIL/uL (ref 4.20–5.82)
RDW: 15 % — AB (ref 11.0–14.6)
WBC Count: 9.8 10*3/uL (ref 4.0–10.3)

## 2017-08-25 MED ORDER — DEXAMETHASONE SODIUM PHOSPHATE 10 MG/ML IJ SOLN
10.0000 mg | Freq: Once | INTRAMUSCULAR | Status: AC
  Administered 2017-08-25: 10 mg via INTRAVENOUS

## 2017-08-25 MED ORDER — DOCETAXEL CHEMO INJECTION 160 MG/16ML: 60.0000 mg/m2 | Freq: Once | INTRAVENOUS | Status: DC

## 2017-08-25 MED ORDER — HEPARIN SOD (PORK) LOCK FLUSH 100 UNIT/ML IV SOLN
500.0000 [IU] | Freq: Once | INTRAVENOUS | Status: AC | PRN
  Administered 2017-08-25: 500 [IU]
  Filled 2017-08-25: qty 5

## 2017-08-25 MED ORDER — DEXAMETHASONE SODIUM PHOSPHATE 10 MG/ML IJ SOLN
INTRAMUSCULAR | Status: AC
  Filled 2017-08-25: qty 1

## 2017-08-25 MED ORDER — SODIUM CHLORIDE 0.9% FLUSH
10.0000 mL | INTRAVENOUS | Status: DC | PRN
  Administered 2017-08-25: 10 mL
  Filled 2017-08-25: qty 10

## 2017-08-25 MED ORDER — DOCETAXEL CHEMO INJECTION 160 MG/16ML
60.0000 mg/m2 | Freq: Once | INTRAVENOUS | Status: AC
  Administered 2017-08-25: 140 mg via INTRAVENOUS
  Filled 2017-08-25: qty 14

## 2017-08-25 MED ORDER — SODIUM CHLORIDE 0.9 % IV SOLN
Freq: Once | INTRAVENOUS | Status: AC
  Administered 2017-08-25: 10:00:00 via INTRAVENOUS

## 2017-08-25 MED ORDER — SODIUM CHLORIDE 0.9% FLUSH
10.0000 mL | INTRAVENOUS | Status: DC | PRN
  Administered 2017-08-25: 10 mL via INTRAVENOUS
  Filled 2017-08-25: qty 10

## 2017-08-25 NOTE — Progress Notes (Signed)
Hematology and Oncology Follow Up Visit  [name] [id number] 01-Nov-1960 57 y.o. 08/25/2017 9:19 AM   Principle Diagnosis: 57 year old man with advanced castration resistant prostate cancer.  He has disease to the bone without any visceral metastasis. He was initially diagnosed in 2012 with Gleason score of 4+5 = 9 and a PSA of 16.6.   Prior Therapy:  He underwent a radical prostatectomy on 04/18/2011 under the care of Dr. Alinda Money. The final pathological staging was T3b N1 with 9 positive lymph nodes out of 11 examined.  He continued to have persistent PSA and was treated with androgen deprivation. His PSA became undetectable until August 2014.    In June 2016 PSA went up to 2.0 and subsequently to 5.6 in November 2016. Staging workup including a bone scan done on 05/15/2015 which showed a focus of increased uptake in the posterior parietal occipital region of the skull.   Xtandi started on 06/10/2015.  Therapy discontinued in September 2018 because of progression of disease.  Zytiga 1000 mg daily with prednisone at 5 mg daily started in October 2018.  Therapy discontinued in January 2019 because of poor response.  Current therapy:   Androgen deprivation utilizing Lupron 30 mg every 4 months.  Ross injection given on 07/25/2017 and will be repeated in 4 months after that.  Taxotere chemotherapy started on 08/04/2017. He received a 75 mg/m. He will receive cycle 2 on 08/25/2017 at 60 mg/m.  Interim History: Mr. Lehner presented today for a follow-up. His last visit, he received the first cycle of Taxotere chemotherapy and tolerated it poorly. He experienced grade 2 arthralgias and myalgias predominantly hip pain and back pain 2-3 days after chemotherapy. He also developed weakness and fatigue and he was not able to perform work-related duties the majority of time. He is to be sent home from work on few occasions. He was able to do work 2 days a week this week. He did report some anorexia and  mild nausea but no vomiting. He denied any peripheral neuropathy but did report some overall weakness.  His pain is predominantly in the lower back and the right hip. He is graded at times between 2-6 out of 10. He does take hydrocodone with improvement in his pain.  He does not report any blurry vision, syncope or seizures. Does not report any fevers, chills, sweats or weight loss. Does not report any chest pain, palpitation, orthopnea or leg edema. Does not report any cough, hemoptysis or wheezing. Does not report any nausea, vomiting, abdominal pain. He denied any early satiety. Does not report any hematochezia, melena or early satiety. Does not report any hematuria or dysuria.   He denies any heat or cold intolerance.  He does not report any skin rashes or lesions.  He denied any anxiety or depression. He does not report any lymphadenopathy or petechiae. Remaining review of systems is negative.  Medications: I have reviewed the patient's current medications.  Current Outpatient Medications  Medication Sig Dispense Refill  . calcium-vitamin D (CALCIUM 500+D) 500-200 MG-UNIT per tablet Take 2 tablets by mouth daily.    Marland Kitchen HYDROcodone-acetaminophen (NORCO) 5-325 MG tablet 1/2 to 1 Q 4 hours prn pain 30 tablet 0  . leuprolide (LUPRON) 22.5 MG injection Inject 22.5 mg into the muscle every 3 (three) months.    . lidocaine-prilocaine (EMLA) cream Apply 1 application topically as needed. 30 g 0  . losartan-hydrochlorothiazide (HYZAAR) 100-12.5 MG tablet losartan 100 mg-hydrochlorothiazide 12.5 mg tablet  Take 1 tablet every day  by oral route.    . predniSONE (DELTASONE) 5 MG tablet TAKE 1 TABLET BY MOUTH EVERY DAY WITH BREAKFAST 30 tablet 0  . prochlorperazine (COMPAZINE) 10 MG tablet Take 1 tablet (10 mg total) by mouth every 6 (six) hours as needed for nausea or vomiting. 30 tablet 0   No current facility-administered medications for this visit.      Allergies:  Allergies  Allergen Reactions   . Lanolin Itching and Rash    Past Medical History, Surgical history, Social history reviewed today and updated.   Physical Exam: Blood pressure 136/85, pulse 70, temperature 99.1 F (37.3 C), temperature source Oral, resp. rate 18, height '5\' 10"'  (1.778 m), weight 254 lb 8 oz (115.4 kg), SpO2 99 %. ECOG: 0 General appearance: Capsule appearing gentleman without distress. Head: Without any abnormalities. A dramatic. Oropharynx: He does have vasomotor symptoms include without ulcers. Eyes: Pupils are equal and round and reactive to light. Conjunctiva not injected. Lymph nodes: Cervical, supraclavicular, and axillary nodes normal. Heart: Regular rate and rhythm without murmurs rubs or gallops. Lung: Clear to auscultation without rhonchi, wheezes or rales to percussion. Abdomin: Soft, nontender without any rebound or guarding. No shifting dullness or ascites. Couldn't bowel sounds. Skin: No petechiae or rash. Port-A-Cath site appears clean and dry. Musculoskeletal: Limited range of motion in his right hip. No joint deformity or effusion. Neurological: No motor, sensory deficits.    Lab Results: Lab Results  Component Value Date   WBC 9.8 08/25/2017   HGB 15.9 07/28/2017   HCT 42.9 08/25/2017   MCV 83.4 08/25/2017   PLT 380 08/25/2017     Chemistry      Component Value Date/Time   NA 141 08/04/2017 0742   NA 144 06/08/2017 1423   K 3.7 08/04/2017 0742   K 3.4 (L) 06/08/2017 1423   CL 106 08/04/2017 0742   CO2 25 08/04/2017 0742   CO2 24 06/08/2017 1423   BUN 16 08/04/2017 0742   BUN 16.1 06/08/2017 1423   CREATININE 0.99 08/04/2017 0742   CREATININE 1.0 06/08/2017 1423      Component Value Date/Time   CALCIUM 9.5 08/04/2017 0742   CALCIUM 9.6 06/08/2017 1423   ALKPHOS 137 08/04/2017 0742   ALKPHOS 128 06/08/2017 1423   AST 9 08/04/2017 0742   AST 17 06/08/2017 1423   ALT 10 08/04/2017 0742   ALT 12 06/08/2017 1423   BILITOT 0.7 08/04/2017 0742   BILITOT 0.74  06/08/2017 1423         Impression and Plan:  57 year old gentleman with the following issues:  1. Castration resistant metastatic prostate cancer with disease to the bone. He has progressed on multiple therapies outlined above after his initial diagnosis in 2012 with a Gleason score of 4+5 = 9. He developed castration resistant disease in 2016.    He is currently receiving Taxotere chemotherapy and received the first cycle with few complications. He experienced arthralgias, myalgias and excessive fatigue. He also experienced anorexia.  Risks and benefits of continuing systemic chemotherapy was discussed today. He is willing to continue at this time but he would benefit from dose reduction given the complications outlined above. The plan is to proceed with cycle 2 at 60 mg/m moving forward. We will restage him with a CT scan and a bone scan to update his disease status.   2. Androgen depravation: Palpitation associated with long-term Lupron was discussed. He will receive that every 4 months and will be due after 11/22/2017.  3.  Bone directed therapy: Delton See will be a reasonable option for him to protect against skeletal related events. We'll await dental clearance after he has been on chemotherapy.  4.  IV access: Cath inserted without complications.  5. Family history of male breast cancer and personal history of prostate cancer.  After obtaining imaging studies, without I recommended repeating a biopsy to check for BRCA2 mutation that could be potentially therapeutic documented in the future.  6.  Hypertension: Blood pressure is normal at this time.  7.  Neutropenia prophylaxis: He will receive Neulasta after each cycle of chemotherapy.  8.  Nausea prophylaxis: Compazine has been effective in treating nausea at this time.  9.  Goals of care: Goal of care is palliative at this time. His performance status is excellent and aggressive therapy is warranted.  10. Follow-up: Will be in  3 weeks to start the next cycle of chemotherapy.  25  minutes was spent with the patient face-to-face today.  More than 50% of time was dedicated to patient counseling, education and answering questions regarding his long-term and short-term care.    Zola Button, MD 3/1/20199:19 AM

## 2017-08-25 NOTE — Telephone Encounter (Signed)
Scheduled appt per 2/1 los - Gave patient AVS and calender per los. - Central radiology to contact patient with ct scans.

## 2017-08-25 NOTE — Patient Instructions (Addendum)
[facility] Discharge Instructions for Patients Receiving Chemotherapy  Today you received the following chemotherapy agents: Docetaxel (Taxotere).   To help prevent nausea and vomiting after your treatment, we encourage you to take your nausea medication as directed.   If you develop nausea and vomiting that is not controlled by your nausea medication, call the clinic.   BELOW ARE SYMPTOMS THAT SHOULD BE REPORTED IMMEDIATELY:  *FEVER GREATER THAN 100.5 F  *CHILLS WITH OR WITHOUT FEVER  NAUSEA AND VOMITING THAT IS NOT CONTROLLED WITH YOUR NAUSEA MEDICATION  *UNUSUAL SHORTNESS OF BREATH  *UNUSUAL BRUISING OR BLEEDING  TENDERNESS IN MOUTH AND THROAT WITH OR WITHOUT PRESENCE OF ULCERS  *URINARY PROBLEMS  *BOWEL PROBLEMS  UNUSUAL RASH Items with * indicate a potential emergency and should be followed up as soon as possible.  Feel free to call the clinic should you have any questions or concerns. The clinic phone number is [phone].  Please show the [place] at check-in to the Emergency Department and triage nurse.  Docetaxel injection What is this medicine? DOCETAXEL (doe se TAX el) is a chemotherapy drug. It targets fast dividing cells, like cancer cells, and causes these cells to die. This medicine is used to treat many types of cancers like breast cancer, certain stomach cancers, head and neck cancer, lung cancer, and prostate cancer. This medicine may be used for other purposes; ask your health care provider or pharmacist if you have questions. COMMON BRAND NAME(S): Docefrez, Taxotere What should I tell my health care provider before I take this medicine? They need to know if you have any of these conditions: -infection (especially a virus infection such as chickenpox, cold sores, or herpes) -liver disease -low blood counts, like low white cell, platelet, or red cell counts -an unusual or allergic reaction to docetaxel, polysorbate 80, other  chemotherapy agents, other medicines, foods, dyes, or preservatives -pregnant or trying to get pregnant -breast-feeding How should I use this medicine? This drug is given as an infusion into a vein. It is administered in a hospital or clinic by a specially trained health care professional. Talk to your pediatrician regarding the use of this medicine in children. Special care may be needed. Overdosage: If you think you have taken too much of this medicine contact a poison control center or emergency room at once. NOTE: This medicine is only for you. Do not share this medicine with others. What if I miss a dose? It is important not to miss your dose. Call your doctor or health care professional if you are unable to keep an appointment. What may interact with this medicine? -cyclosporine -erythromycin -ketoconazole -medicines to increase blood counts like filgrastim, pegfilgrastim, sargramostim -vaccines Talk to your doctor or health care professional before taking any of these medicines: -acetaminophen -aspirin -ibuprofen -ketoprofen -naproxen This list may not describe all possible interactions. Give your health care provider a list of all the medicines, herbs, non-prescription drugs, or dietary supplements you use. Also tell them if you smoke, drink alcohol, or use illegal drugs. Some items may interact with your medicine. What should I watch for while using this medicine? Your condition will be monitored carefully while you are receiving this medicine. You will need important blood work done while you are taking this medicine. This drug may make you feel generally unwell. This is not uncommon, as chemotherapy can affect healthy cells as well as cancer cells. Report any side effects. Continue your course of treatment even though you feel  ill unless your doctor tells you to stop. In some cases, you may be given additional medicines to help with side effects. Follow all directions for their  use. Call your doctor or health care professional for advice if you get a fever, chills or sore throat, or other symptoms of a cold or flu. Do not treat yourself. This drug decreases your body's ability to fight infections. Try to avoid being around people who are sick. This medicine may increase your risk to bruise or bleed. Call your doctor or health care professional if you notice any unusual bleeding. This medicine may contain alcohol in the product. You may get drowsy or dizzy. Do not drive, use machinery, or do anything that needs mental alertness until you know how this medicine affects you. Do not stand or sit up quickly, especially if you are an older patient. This reduces the risk of dizzy or fainting spells. Avoid alcoholic drinks. Do not become pregnant while taking this medicine. Women should inform their doctor if they wish to become pregnant or think they might be pregnant. There is a potential for serious side effects to an unborn child. Talk to your health care professional or pharmacist for more information. Do not breast-feed an infant while taking this medicine. What side effects may I notice from receiving this medicine? Side effects that you should report to your doctor or health care professional as soon as possible: -allergic reactions like skin rash, itching or hives, swelling of the face, lips, or tongue -low blood counts - This drug may decrease the number of white blood cells, red blood cells and platelets. You may be at increased risk for infections and bleeding. -signs of infection - fever or chills, cough, sore throat, pain or difficulty passing urine -signs of decreased platelets or bleeding - bruising, pinpoint red spots on the skin, black, tarry stools, nosebleeds -signs of decreased red blood cells - unusually weak or tired, fainting spells, lightheadedness -breathing problems -fast or irregular heartbeat -low blood pressure -mouth sores -nausea and vomiting -pain,  swelling, redness or irritation at the injection site -pain, tingling, numbness in the hands or feet -swelling of the ankle, feet, hands -weight gain Side effects that usually do not require medical attention (report to your doctor or health care professional if they continue or are bothersome): -bone pain -complete hair loss including hair on your head, underarms, pubic hair, eyebrows, and eyelashes -diarrhea -excessive tearing -changes in the color of fingernails -loosening of the fingernails -nausea -muscle pain -red flush to skin -sweating -weak or tired This list may not describe all possible side effects. Call your doctor for medical advice about side effects. You may report side effects to FDA at 1-800-FDA-1088. Where should I keep my medicine? This drug is given in a hospital or clinic and will not be stored at home. NOTE: This sheet is a summary. It may not cover all possible information. If you have questions about this medicine, talk to your doctor, pharmacist, or health care provider.  2018 Elsevier/Gold Standard (2015-07-16 12:32:56)

## 2017-08-26 LAB — PROSTATE-SPECIFIC AG, SERUM (LABCORP): Prostate Specific Ag, Serum: 68.1 ng/mL — ABNORMAL HIGH (ref 0.0–4.0)

## 2017-08-28 ENCOUNTER — Telehealth: Payer: Self-pay

## 2017-08-28 ENCOUNTER — Telehealth: Payer: Self-pay | Admitting: Oncology

## 2017-08-28 ENCOUNTER — Inpatient Hospital Stay: Payer: 59

## 2017-08-28 VITALS — BP 158/84 | HR 78 | Temp 98.2°F | Resp 18

## 2017-08-28 DIAGNOSIS — C61 Malignant neoplasm of prostate: Secondary | ICD-10-CM

## 2017-08-28 DIAGNOSIS — Z5111 Encounter for antineoplastic chemotherapy: Secondary | ICD-10-CM | POA: Diagnosis not present

## 2017-08-28 MED ORDER — PEGFILGRASTIM-CBQV 6 MG/0.6ML ~~LOC~~ SOSY
6.0000 mg | PREFILLED_SYRINGE | Freq: Once | SUBCUTANEOUS | Status: AC
  Administered 2017-08-28: 6 mg via SUBCUTANEOUS

## 2017-08-28 MED ORDER — PEGFILGRASTIM-CBQV 6 MG/0.6ML ~~LOC~~ SOSY
PREFILLED_SYRINGE | SUBCUTANEOUS | Status: AC
  Filled 2017-08-28: qty 0.6

## 2017-08-28 NOTE — Telephone Encounter (Signed)
Pt wife, Vaughan Basta, called requesting update regarding PSA. Shared that PSA is elevated, but per Dr. Alen Blew it make take multiple treatments to see change of PSA in the other direction. Additionally, she was wondering about bone scan and CT to be scheduled. Shared Central Scheduling number to call and get imaging appointment [phone]. Pt wife verbalized understanding of scheduling process and that it will take at least a few days for results. Follow-up appt on 09/15/17.

## 2017-08-28 NOTE — Patient Instructions (Signed)
Pegfilgrastim injection What is this medicine? PEGFILGRASTIM (PEG fil gra stim) is a long-acting granulocyte colony-stimulating factor that stimulates the growth of neutrophils, a type of white blood cell important in the body's fight against infection. It is used to reduce the incidence of fever and infection in patients with certain types of cancer who are receiving chemotherapy that affects the bone marrow, and to increase survival after being exposed to high doses of radiation. This medicine may be used for other purposes; ask your health care provider or pharmacist if you have questions. COMMON BRAND NAME(S): Neulasta What should I tell my health care provider before I take this medicine? They need to know if you have any of these conditions: -kidney disease -latex allergy -ongoing radiation therapy -sickle cell disease -skin reactions to acrylic adhesives (On-Body Injector only) -an unusual or allergic reaction to pegfilgrastim, filgrastim, other medicines, foods, dyes, or preservatives -pregnant or trying to get pregnant -breast-feeding How should I use this medicine? This medicine is for injection under the skin. If you get this medicine at home, you will be taught how to prepare and give the pre-filled syringe or how to use the On-body Injector. Refer to the patient Instructions for Use for detailed instructions. Use exactly as directed. Tell your healthcare provider immediately if you suspect that the On-body Injector may not have performed as intended or if you suspect the use of the On-body Injector resulted in a missed or partial dose. It is important that you put your used needles and syringes in a special sharps container. Do not put them in a trash can. If you do not have a sharps container, call your pharmacist or healthcare provider to get one. Talk to your pediatrician regarding the use of this medicine in children. While this drug may be prescribed for selected conditions,  precautions do apply. Overdosage: If you think you have taken too much of this medicine contact a poison control center or emergency room at once. NOTE: This medicine is only for you. Do not share this medicine with others. What if I miss a dose? It is important not to miss your dose. Call your doctor or health care professional if you miss your dose. If you miss a dose due to an On-body Injector failure or leakage, a new dose should be administered as soon as possible using a single prefilled syringe for manual use. What may interact with this medicine? Interactions have not been studied. Give your health care provider a list of all the medicines, herbs, non-prescription drugs, or dietary supplements you use. Also tell them if you smoke, drink alcohol, or use illegal drugs. Some items may interact with your medicine. This list may not describe all possible interactions. Give your health care provider a list of all the medicines, herbs, non-prescription drugs, or dietary supplements you use. Also tell them if you smoke, drink alcohol, or use illegal drugs. Some items may interact with your medicine. What should I watch for while using this medicine? You may need blood work done while you are taking this medicine. If you are going to need a MRI, CT scan, or other procedure, tell your doctor that you are using this medicine (On-Body Injector only). What side effects may I notice from receiving this medicine? Side effects that you should report to your doctor or health care professional as soon as possible: -allergic reactions like skin rash, itching or hives, swelling of the face, lips, or tongue -dizziness -fever -pain, redness, or irritation at site   where injected -pinpoint red spots on the skin -red or dark-brown urine -shortness of breath or breathing problems -stomach or side pain, or pain at the shoulder -swelling -tiredness -trouble passing urine or change in the amount of urine Side  effects that usually do not require medical attention (report to your doctor or health care professional if they continue or are bothersome): -bone pain -muscle pain This list may not describe all possible side effects. Call your doctor for medical advice about side effects. You may report side effects to FDA at 1-800-FDA-1088. Where should I keep my medicine? Keep out of the reach of children. Store pre-filled syringes in a refrigerator between 2 and 8 degrees C (36 and 46 degrees F). Do not freeze. Keep in carton to protect from light. Throw away this medicine if it is left out of the refrigerator for more than 48 hours. Throw away any unused medicine after the expiration date. NOTE: This sheet is a summary. It may not cover all possible information. If you have questions about this medicine, talk to your doctor, pharmacist, or health care provider.  2018 [employer]vier/Gold Standard (2016-06-09 12:58:03)  

## 2017-08-28 NOTE — Telephone Encounter (Signed)
08/28/17 successfully faxed FMLA to [facility] @ [phone] @ 12:18 pm.  Mailed copy to patient for personal record.

## 2017-09-06 ENCOUNTER — Encounter (HOSPITAL_COMMUNITY): Payer: Self-pay

## 2017-09-06 ENCOUNTER — Ambulatory Visit (HOSPITAL_COMMUNITY)
Admission: RE | Admit: 2017-09-06 | Discharge: 2017-09-06 | Disposition: A | Discharge status: Home / Self Care | Payer: 59 | Source: Ambulatory Visit | Attending: Oncology | Admitting: Oncology

## 2017-09-06 ENCOUNTER — Encounter (HOSPITAL_COMMUNITY)
Admission: RE | Admit: 2017-09-06 | Discharge: 2017-09-06 | Disposition: A | Discharge status: Home / Self Care | Payer: 59 | Source: Ambulatory Visit | Attending: Oncology | Admitting: Oncology

## 2017-09-06 DIAGNOSIS — C7951 Secondary malignant neoplasm of bone: Secondary | ICD-10-CM | POA: Insufficient documentation

## 2017-09-06 DIAGNOSIS — C61 Malignant neoplasm of prostate: Secondary | ICD-10-CM | POA: Diagnosis not present

## 2017-09-06 DIAGNOSIS — I2510 Atherosclerotic heart disease of native coronary artery without angina pectoris: Secondary | ICD-10-CM | POA: Diagnosis not present

## 2017-09-06 DIAGNOSIS — I700 Atherosclerosis of aorta: Secondary | ICD-10-CM | POA: Diagnosis not present

## 2017-09-06 DIAGNOSIS — N200 Calculus of kidney: Secondary | ICD-10-CM | POA: Insufficient documentation

## 2017-09-06 MED ORDER — HEPARIN SOD (PORK) LOCK FLUSH 100 UNIT/ML IV SOLN
500.0000 [IU] | Freq: Once | INTRAVENOUS | Status: AC
  Administered 2017-09-06: 500 [IU] via INTRAVENOUS

## 2017-09-06 MED ORDER — HEPARIN SOD (PORK) LOCK FLUSH 100 UNIT/ML IV SOLN
INTRAVENOUS | Status: AC
  Filled 2017-09-06: qty 5

## 2017-09-06 MED ORDER — SODIUM CHLORIDE 0.9 % IJ SOLN
INTRAMUSCULAR | Status: AC
  Filled 2017-09-06: qty 50

## 2017-09-06 MED ORDER — IOPAMIDOL (ISOVUE-300) INJECTION 61%
100.0000 mL | Freq: Once | INTRAVENOUS | Status: AC | PRN
  Administered 2017-09-06: 100 mL via INTRAVENOUS

## 2017-09-06 MED ORDER — IOPAMIDOL (ISOVUE-300) INJECTION 61%
INTRAVENOUS | Status: AC
  Filled 2017-09-06: qty 100

## 2017-09-06 MED ORDER — TECHNETIUM TC 99M MEDRONATE IV KIT
25.0000 | PACK | Freq: Once | INTRAVENOUS | Status: AC | PRN
  Administered 2017-09-06: 21.1 via INTRAVENOUS

## 2017-09-07 ENCOUNTER — Telehealth: Payer: Self-pay | Admitting: Oncology

## 2017-09-07 ENCOUNTER — Telehealth: Payer: Self-pay | Admitting: *Deleted

## 2017-09-07 NOTE — Telephone Encounter (Signed)
Patient's wife linda calling to request results of last CT and bone scan, because his next appt is not until 09/15/17. Would dr Alen Blew or his nurse call us?

## 2017-09-07 NOTE — Telephone Encounter (Signed)
Results of the scans discussed with the patient over the phone. He has an appointment next week to discuss face to face as well.

## 2017-09-07 NOTE — Telephone Encounter (Signed)
Faxed Part B of FMLA for patient's caregiver, Bush Murdoch.  Mailed a copy for personal records.

## 2017-09-11 ENCOUNTER — Telehealth: Payer: Self-pay | Admitting: *Deleted

## 2017-09-11 NOTE — Telephone Encounter (Signed)
Faxed ROI to [facility] 09/11/17 @ 2:24pm, release ID # [id number]

## 2017-09-13 ENCOUNTER — Telehealth: Payer: Self-pay | Admitting: Oncology

## 2017-09-13 NOTE — Telephone Encounter (Signed)
10:04 am left vm for patient to confirm Disability paperwork has been successfully faxed to [facility] @ [phone] @ 9:31 am.  Mailed a copy to patient for personal records.

## 2017-09-15 ENCOUNTER — Telehealth: Payer: Self-pay | Admitting: Oncology

## 2017-09-15 ENCOUNTER — Inpatient Hospital Stay: Payer: 59

## 2017-09-15 ENCOUNTER — Inpatient Hospital Stay (HOSPITAL_BASED_OUTPATIENT_CLINIC_OR_DEPARTMENT_OTHER): Payer: 59 | Admitting: Oncology

## 2017-09-15 VITALS — BP 133/81 | HR 66 | Temp 98.7°F | Resp 18 | Ht 70.0 in | Wt 255.8 lb

## 2017-09-15 DIAGNOSIS — Z5111 Encounter for antineoplastic chemotherapy: Secondary | ICD-10-CM | POA: Diagnosis not present

## 2017-09-15 DIAGNOSIS — C7951 Secondary malignant neoplasm of bone: Secondary | ICD-10-CM

## 2017-09-15 DIAGNOSIS — C61 Malignant neoplasm of prostate: Secondary | ICD-10-CM

## 2017-09-15 DIAGNOSIS — Z79899 Other long term (current) drug therapy: Secondary | ICD-10-CM

## 2017-09-15 DIAGNOSIS — Z95828 Presence of other vascular implants and grafts: Secondary | ICD-10-CM

## 2017-09-15 LAB — CMP (CANCER CENTER ONLY)
ALT: 12 U/L (ref 0–55)
AST: 8 U/L (ref 5–34)
Albumin: 3.5 g/dL (ref 3.5–5.0)
Alkaline Phosphatase: 95 U/L (ref 40–150)
Anion gap: 8 (ref 3–11)
BILIRUBIN TOTAL: 0.8 mg/dL (ref 0.2–1.2)
BUN: 18 mg/dL (ref 7–26)
CHLORIDE: 106 mmol/L (ref 98–109)
CO2: 27 mmol/L (ref 22–29)
CREATININE: 0.99 mg/dL (ref 0.70–1.30)
Calcium: 9.4 mg/dL (ref 8.4–10.4)
Glucose, Bld: 91 mg/dL (ref 70–140)
Potassium: 3.9 mmol/L (ref 3.5–5.1)
Sodium: 141 mmol/L (ref 136–145)
TOTAL PROTEIN: 6.6 g/dL (ref 6.4–8.3)

## 2017-09-15 LAB — CBC WITH DIFFERENTIAL (CANCER CENTER ONLY)
BASOS ABS: 0.1 10*3/uL (ref 0.0–0.1)
BASOS PCT: 1 %
EOS ABS: 0 10*3/uL (ref 0.0–0.5)
Eosinophils Relative: 1 %
HCT: 40.6 % (ref 38.4–49.9)
HEMOGLOBIN: 13.5 g/dL (ref 13.0–17.1)
LYMPHS ABS: 1.3 10*3/uL (ref 0.9–3.3)
Lymphocytes Relative: 17 %
MCH: 28.2 pg (ref 27.2–33.4)
MCHC: 33.1 g/dL (ref 32.0–36.0)
MCV: 85.3 fL (ref 79.3–98.0)
Monocytes Absolute: 0.7 10*3/uL (ref 0.1–0.9)
Monocytes Relative: 10 %
NEUTROS PCT: 71 %
Neutro Abs: 5.5 10*3/uL (ref 1.5–6.5)
PLATELETS: 233 10*3/uL (ref 140–400)
RBC: 4.76 MIL/uL (ref 4.20–5.82)
RDW: 16.5 % — ABNORMAL HIGH (ref 11.0–14.6)
WBC: 7.7 10*3/uL (ref 4.0–10.3)

## 2017-09-15 MED ORDER — DEXAMETHASONE SODIUM PHOSPHATE 10 MG/ML IJ SOLN
INTRAMUSCULAR | Status: AC
  Filled 2017-09-15: qty 1

## 2017-09-15 MED ORDER — ALTEPLASE 2 MG IJ SOLR
2.0000 mg | Freq: Once | INTRAMUSCULAR | Status: DC | PRN
  Filled 2017-09-15: qty 2

## 2017-09-15 MED ORDER — SODIUM CHLORIDE 0.9% FLUSH
10.0000 mL | INTRAVENOUS | Status: DC | PRN
  Administered 2017-09-15: 10 mL via INTRAVENOUS
  Filled 2017-09-15: qty 10

## 2017-09-15 MED ORDER — SODIUM CHLORIDE 0.9 % IV SOLN
Freq: Once | INTRAVENOUS | Status: AC
  Administered 2017-09-15: 10:00:00 via INTRAVENOUS

## 2017-09-15 MED ORDER — SODIUM CHLORIDE 0.9 % IV SOLN
60.0000 mg/m2 | Freq: Once | INTRAVENOUS | Status: AC
  Administered 2017-09-15: 140 mg via INTRAVENOUS
  Filled 2017-09-15: qty 14

## 2017-09-15 MED ORDER — HEPARIN SOD (PORK) LOCK FLUSH 100 UNIT/ML IV SOLN
500.0000 [IU] | Freq: Once | INTRAVENOUS | Status: AC | PRN
  Administered 2017-09-15: 500 [IU]
  Filled 2017-09-15: qty 5

## 2017-09-15 MED ORDER — DEXAMETHASONE SODIUM PHOSPHATE 10 MG/ML IJ SOLN
10.0000 mg | Freq: Once | INTRAMUSCULAR | Status: AC
  Administered 2017-09-15: 10 mg via INTRAVENOUS

## 2017-09-15 MED ORDER — SODIUM CHLORIDE 0.9% FLUSH
10.0000 mL | INTRAVENOUS | Status: DC | PRN
  Administered 2017-09-15: 10 mL
  Filled 2017-09-15: qty 10

## 2017-09-15 MED ORDER — HEPARIN SOD (PORK) LOCK FLUSH 100 UNIT/ML IV SOLN
500.0000 [IU] | Freq: Once | INTRAVENOUS | Status: DC
  Filled 2017-09-15: qty 5

## 2017-09-15 NOTE — Patient Instructions (Signed)
[facility] [place] Discharge Instructions for Patients Receiving Chemotherapy  Today you received the following chemotherapy agents: docetaxel (Taxotere).   To help prevent nausea and vomiting after your treatment, we encourage you to take your nausea medication as directed.   If you develop nausea and vomiting that is not controlled by your nausea medication, call the clinic.   BELOW ARE SYMPTOMS THAT SHOULD BE REPORTED IMMEDIATELY:  *FEVER GREATER THAN 100.5 F  *CHILLS WITH OR WITHOUT FEVER  NAUSEA AND VOMITING THAT IS NOT CONTROLLED WITH YOUR NAUSEA MEDICATION  *UNUSUAL SHORTNESS OF BREATH  *UNUSUAL BRUISING OR BLEEDING  TENDERNESS IN MOUTH AND THROAT WITH OR WITHOUT PRESENCE OF ULCERS  *URINARY PROBLEMS  *BOWEL PROBLEMS  UNUSUAL RASH Items with * indicate a potential emergency and should be followed up as soon as possible.  Feel free to call the clinic should you have any questions or concerns. The clinic phone number is [phone].  Please show the [place] at check-in to the Emergency Department and triage nurse.

## 2017-09-15 NOTE — Telephone Encounter (Signed)
Appointments scheduled AVS printed per 3/22 los ° °

## 2017-09-15 NOTE — Progress Notes (Signed)
Hematology and Oncology Follow Up Visit  [name] [id number] 05/03/1961 57 y.o. 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  He was found to have Gleason score of 4+5 = 9 and a PSA of 16.6 prostate cancer 2012.   Prior Therapy:  He underwent a radical prostatectomy on 04/18/2011 under the care of Dr. Alinda Money. The final pathological staging was T3b N1 with 9 positive lymph nodes out of 11 examined.  He continued to have persistent PSA and was treated with androgen deprivation. His PSA became undetectable until August 2014.    In June 2016 PSA went up to 2.0 and subsequently to 5.6 in November 2016. Staging workup including a bone scan done on 05/15/2015 which showed a focus of increased uptake in the posterior parietal occipital region of the skull.   Xtandi started on 06/10/2015.  Therapy discontinued in September 2018 because of progression of disease.  Zytiga 1000 mg daily with prednisone at 5 mg daily started in October 2018.  Therapy discontinued in January 2019 because of poor response.  Current therapy:   Androgen deprivation utilizing Lupron 30 mg every 4 months.  Ross injection given on 07/25/2017 and will be repeated in 4 months after that.  Taxotere chemotherapy started on 08/04/2017. He received a 75 mg/m on cycle 1.  He is receiving subsequent cycles at 60 mg/m.  Interim History: [name] is here for follow-up with his wife.  He tolerated cycle 2 of chemotherapy without any major complications.  He denied any nausea, vomiting or appetite changes.  He did have mild fatigue but has been manageable.  He denied any worsening neuropathy or bone pain.  He still ambulating and attends to activities of daily living without any decline.  He does not report any blurry vision, syncope or seizures. Does not report any fevers, chills, sweats or weight loss. Does not report any chest pain,  palpitation, orthopnea or leg edema. Does not report any cough, hemoptysis or wheezing. Does not report any nausea, vomiting, abdominal pain. He denied any early satiety. Does not report any hematochezia, melena or early satiety. Does not report any hematuria or dysuria.   He denies any heat or cold intolerance.  He does not report any skin rashes or lesions.  He denied any anxiety or depression. He does not report any lymphadenopathy or petechiae. Remaining review of systems is negative.  Medications: I have reviewed the patient's current medications.  Current Outpatient Medications  Medication Sig Dispense Refill  . calcium-vitamin D (CALCIUM 500+D) 500-200 MG-UNIT per tablet Take 2 tablets by mouth daily.    Marland Kitchen HYDROcodone-acetaminophen (NORCO) 5-325 MG tablet 1/2 to 1 Q 4 hours prn pain 30 tablet 0  . leuprolide (LUPRON) 22.5 MG injection Inject 22.5 mg into the muscle every 3 (three) months.    . lidocaine-prilocaine (EMLA) cream Apply 1 application topically as needed. 30 g 0  . losartan-hydrochlorothiazide (HYZAAR) 100-12.5 MG tablet losartan 100 mg-hydrochlorothiazide 12.5 mg tablet  Take 1 tablet every day by oral route.    . predniSONE (DELTASONE) 5 MG tablet TAKE 1 TABLET BY MOUTH EVERY DAY WITH BREAKFAST 30 tablet 0  . prochlorperazine (COMPAZINE) 10 MG tablet Take 1 tablet (10 mg total) by mouth every 6 (six) hours as needed for nausea or vomiting. 30 tablet 0   No current facility-administered medications for this visit.    Facility-Administered Medications Ordered in Other Visits  Medication Dose Route Frequency  Provider Last Rate Last Dose  . alteplase (CATHFLO ACTIVASE) injection 2 mg  2 mg Intracatheter Once PRN [name], MD      . heparin lock flush 100 unit/mL  500 Units Intravenous Once [name], MD      . sodium chloride flush (NS) 0.9 % injection 10 mL  10 mL Intravenous PRN [name], MD   10 mL at 09/15/17 0913     Allergies:  Allergies  Allergen  Reactions  . Lanolin Itching and Rash    Past Medical History, Surgical history, Social history reviewed today and updated.   Physical Exam:  Blood pressure 133/81, pulse 66, temperature 98.7 F (37.1 C), temperature source Oral, resp. rate 18, height '5\' 10"'  (1.778 m), weight 255 lb 12.8 oz (116 kg), SpO2 100 %.  ECOG: 0 General appearance: Alert, awake gentleman without distress. Head: Atraumatic without abnormalities. Oropharynx: Mucous membranes are moist and pink.  No dental abscess. Eyes: Sclera anicteric. Lymph nodes: Cervical, supraclavicular, and axillary nodes normal. Heart: S1 and S2 with regular rate and rhythm. Lung: Clear without any rhonchi, wheezes or dullness to percussion. Abdomin: Soft, nontender without any rebound or guarding.  No shifting dullness or ascites. Skin: No petechiae or rash.  Musculoskeletal: No joint deformity or effusion. Neurological: Intact sensation, motor and deep tendon reflexes.    Lab Results: Lab Results  Component Value Date   WBC 9.8 08/25/2017   HGB 15.9 07/28/2017   HCT 42.9 08/25/2017   MCV 83.4 08/25/2017   PLT 380 08/25/2017     Chemistry      Component Value Date/Time   NA 142 08/25/2017 0824   NA 144 06/08/2017 1423   K 3.9 08/25/2017 0824   K 3.4 (L) 06/08/2017 1423   CL 106 08/25/2017 0824   CO2 26 08/25/2017 0824   CO2 24 06/08/2017 1423   BUN 18 08/25/2017 0824   BUN 16.1 06/08/2017 1423   CREATININE 1.06 08/25/2017 0824   CREATININE 1.0 06/08/2017 1423      Component Value Date/Time   CALCIUM 9.6 08/25/2017 0824   CALCIUM 9.6 06/08/2017 1423   ALKPHOS 103 08/25/2017 0824   ALKPHOS 128 06/08/2017 1423   AST 8 08/25/2017 0824   AST 17 06/08/2017 1423   ALT 17 08/25/2017 0824   ALT 12 06/08/2017 1423   BILITOT 0.7 08/25/2017 0824   BILITOT 0.74 06/08/2017 1423       EXAM: CT ABDOMEN AND PELVIS WITH CONTRAST  TECHNIQUE: Multidetector CT imaging of the abdomen and pelvis was performed using the  standard protocol following bolus administration of intravenous contrast.  CONTRAST:  17m ISOVUE-300 IOPAMIDOL (ISOVUE-300) INJECTION 61%  COMPARISON:  03/21/2017 CT abdomen/pelvis.  FINDINGS: Lower chest: Solid anterior right middle lobe 3 mm pulmonary nodule (series 4/image 10), stable. Mild platelike atelectasis at the right lung base. Coronary atherosclerosis.  Hepatobiliary: Normal liver size. No liver mass. Normal gallbladder with no radiopaque cholelithiasis. No biliary ductal dilatation.  Pancreas: Diffuse fatty infiltration of the pancreas. No pancreatic mass or duct dilation.  Spleen: Normal size. No mass.  Adrenals/Urinary Tract: Normal adrenals. Right renal pelvis 10 mm stone. No overt right hydronephrosis. Nonobstructing 8 mm upper left renal stone. No left hydronephrosis. Simple 1.6 cm interpolar right renal cyst. No additional renal lesions. Normal nondistended bladder.  Stomach/Bowel: Normal non-distended stomach. Normal caliber small bowel with no small bowel wall thickening. Normal appendix. Stable mild circumferential wall thickening throughout the rectum. No new large bowel wall thickening.  Mild sigmoid diverticulosis. No acute pericolonic fat stranding. Oral contrast transits to the distal rectum.  Vascular/Lymphatic: Atherosclerotic nonaneurysmal abdominal aorta. Patent portal, splenic, hepatic and renal veins. No pathologically enlarged lymph nodes in the abdomen or pelvis.  Reproductive: Prostatectomy. No discrete mass or fluid collection in the prostatectomy bed.  Other: No pneumoperitoneum, ascites or focal fluid collection.  Musculoskeletal: Widespread patchy sclerotic osseous lesions throughout the visualized skeleton have increased in size and sclerosis. For example a right T8 sclerotic 2.8 cm lesion (series 2/image 4), increased from 2.4 cm. A right iliac crest 2.7 cm lesion (series 2/image 71), increased from 1.2 cm. A right  ischial 1.5 cm lesion (series 2/image 92), increased from 1.0 cm. Moderate lower thoracic spondylosis.  IMPRESSION: 1. Widespread patchy sclerotic osseous metastases have increased in size and sclerosis, suggesting disease progression. 2. No lymphadenopathy or other soft tissue metastatic disease in the abdomen or pelvis. 3. Stable tiny right middle lobe 3 mm pulmonary nodule. 4. Nonobstructing bilateral nephrolithiasis, including a 10 mm right renal pelvis stone. 5. Stable chronic mild circumferential rectal wall thickening, probably post treatment related. 6.  Aortic Atherosclerosis (ICD10-I70.0).  Coronary atherosclerosis.    Impression and Plan:  57 year old gentleman with the following issues:  1.  Advanced prostate cancer that is castration resistant with disease to the bone.    He is currently receiving Taxotere chemotherapy and tolerated cycle 2 much better after dose reduction.  He denied any delayed complications at this time.  CT scan and bone scan obtained on 09/06/2017 were personally reviewed and showed predominantly bone disease without any visible metastasis.  Risks and benefits of continuing chemotherapy was reviewed today and is agreeable to continue.   2. Androgen depravation: He will receive that every 4 months and will be due after 11/22/2017.  Long-term complication associated with androgen deprivation was discussed today and is agreeable to continue.  3. Bone directed therapy: Risks and benefits of bone directed therapy in the form of Xgeva or Zometa was discussed today.  He will obtain dental clearance before the next visit to start therapy.  Hypocalcemia and osteonecrosis of the jaw were reviewed.  4.  IV access: Port-A-Cath is in place and in use without complications.  5. Family history of male breast cancer and personal history of prostate cancer.  BRCA2 mutation is a possibility and will be checked on the tumor biopsy in the future.   6.  Neutropenia  prophylaxis: He is at risk of developing neutropenia and he will receive Neulasta after each cycle of chemotherapy.  7.  Nausea prophylaxis: No nausea or vomiting reported at this time.  Compazine will be available to him.  8.  Goals of care: His performance status remain excellent and aggressive therapy is warranted despite having incurable disease.  9. Follow-up: Will be in 3 weeks to start the next cycle of chemotherapy.  25  minutes was spent with the patient face-to-face today.  More than 50% of time was dedicated to patient counseling, education and coordinating his multifaceted care.   Zola Button, MD 3/22/20199:24 AM

## 2017-09-16 LAB — PROSTATE-SPECIFIC AG, SERUM (LABCORP): PROSTATE SPECIFIC AG, SERUM: 89.9 ng/mL — AB (ref 0.0–4.0)

## 2017-09-18 ENCOUNTER — Telehealth: Payer: Self-pay | Admitting: *Deleted

## 2017-09-18 ENCOUNTER — Inpatient Hospital Stay: Payer: 59

## 2017-09-18 ENCOUNTER — Encounter: Payer: Self-pay | Admitting: *Deleted

## 2017-09-18 VITALS — BP 149/88 | HR 77 | Temp 98.2°F | Resp 20

## 2017-09-18 DIAGNOSIS — Z5111 Encounter for antineoplastic chemotherapy: Secondary | ICD-10-CM | POA: Diagnosis not present

## 2017-09-18 DIAGNOSIS — C61 Malignant neoplasm of prostate: Secondary | ICD-10-CM

## 2017-09-18 MED ORDER — PEGFILGRASTIM-CBQV 6 MG/0.6ML ~~LOC~~ SOSY
PREFILLED_SYRINGE | SUBCUTANEOUS | Status: AC
  Filled 2017-09-18: qty 0.6

## 2017-09-18 MED ORDER — PEGFILGRASTIM-CBQV 6 MG/0.6ML ~~LOC~~ SOSY
6.0000 mg | PREFILLED_SYRINGE | Freq: Once | SUBCUTANEOUS | Status: AC
  Administered 2017-09-18: 6 mg via SUBCUTANEOUS

## 2017-09-18 NOTE — Telephone Encounter (Signed)
-----   Message from [name], MD sent at 09/18/2017  8:11 AM EDT ----- Please let him know his PSA is up. Like we discussed, he will stay on chemo for few more cycles to really see the PSA drop. No change for now.

## 2017-09-18 NOTE — Telephone Encounter (Signed)
Spoke with wife linda, gave results of last PSA. And phone  # of  case manager kelly hooks rn with [employer]. [phone]. Who has been trying to reach patient.

## 2017-09-18 NOTE — Patient Instructions (Signed)
Pegfilgrastim injection What is this medicine? PEGFILGRASTIM (PEG fil gra stim) is a long-acting granulocyte colony-stimulating factor that stimulates the growth of neutrophils, a type of white blood cell important in the body's fight against infection. It is used to reduce the incidence of fever and infection in patients with certain types of cancer who are receiving chemotherapy that affects the bone marrow, and to increase survival after being exposed to high doses of radiation. This medicine may be used for other purposes; ask your health care provider or pharmacist if you have questions. COMMON BRAND NAME(S): Neulasta What should I tell my health care provider before I take this medicine? They need to know if you have any of these conditions: -kidney disease -latex allergy -ongoing radiation therapy -sickle cell disease -skin reactions to acrylic adhesives (On-Body Injector only) -an unusual or allergic reaction to pegfilgrastim, filgrastim, other medicines, foods, dyes, or preservatives -pregnant or trying to get pregnant -breast-feeding How should I use this medicine? This medicine is for injection under the skin. If you get this medicine at home, you will be taught how to prepare and give the pre-filled syringe or how to use the On-body Injector. Refer to the patient Instructions for Use for detailed instructions. Use exactly as directed. Tell your healthcare provider immediately if you suspect that the On-body Injector may not have performed as intended or if you suspect the use of the On-body Injector resulted in a missed or partial dose. It is important that you put your used needles and syringes in a special sharps container. Do not put them in a trash can. If you do not have a sharps container, call your pharmacist or healthcare provider to get one. Talk to your pediatrician regarding the use of this medicine in children. While this drug may be prescribed for selected conditions,  precautions do apply. Overdosage: If you think you have taken too much of this medicine contact a poison control center or emergency room at once. NOTE: This medicine is only for you. Do not share this medicine with others. What if I miss a dose? It is important not to miss your dose. Call your doctor or health care professional if you miss your dose. If you miss a dose due to an On-body Injector failure or leakage, a new dose should be administered as soon as possible using a single prefilled syringe for manual use. What may interact with this medicine? Interactions have not been studied. Give your health care provider a list of all the medicines, herbs, non-prescription drugs, or dietary supplements you use. Also tell them if you smoke, drink alcohol, or use illegal drugs. Some items may interact with your medicine. This list may not describe all possible interactions. Give your health care provider a list of all the medicines, herbs, non-prescription drugs, or dietary supplements you use. Also tell them if you smoke, drink alcohol, or use illegal drugs. Some items may interact with your medicine. What should I watch for while using this medicine? You may need blood work done while you are taking this medicine. If you are going to need a MRI, CT scan, or other procedure, tell your doctor that you are using this medicine (On-Body Injector only). What side effects may I notice from receiving this medicine? Side effects that you should report to your doctor or health care professional as soon as possible: -allergic reactions like skin rash, itching or hives, swelling of the face, lips, or tongue -dizziness -fever -pain, redness, or irritation at site   where injected -pinpoint red spots on the skin -red or dark-brown urine -shortness of breath or breathing problems -stomach or side pain, or pain at the shoulder -swelling -tiredness -trouble passing urine or change in the amount of urine Side  effects that usually do not require medical attention (report to your doctor or health care professional if they continue or are bothersome): -bone pain -muscle pain This list may not describe all possible side effects. Call your doctor for medical advice about side effects. You may report side effects to FDA at 1-800-FDA-1088. Where should I keep my medicine? Keep out of the reach of children. Store pre-filled syringes in a refrigerator between 2 and 8 degrees C (36 and 46 degrees F). Do not freeze. Keep in carton to protect from light. Throw away this medicine if it is left out of the refrigerator for more than 48 hours. Throw away any unused medicine after the expiration date. NOTE: This sheet is a summary. It may not cover all possible information. If you have questions about this medicine, talk to your doctor, pharmacist, or health care provider.  2018 [employer]vier/Gold Standard (2016-06-09 12:58:03)  

## 2017-09-25 ENCOUNTER — Other Ambulatory Visit: Payer: Self-pay | Admitting: *Deleted

## 2017-09-25 DIAGNOSIS — C61 Malignant neoplasm of prostate: Secondary | ICD-10-CM

## 2017-09-25 MED ORDER — PREDNISONE 5 MG PO TABS: 5.0000 mg | ORAL_TABLET | Freq: Every day | ORAL | 0 refills | Status: DC

## 2017-10-06 ENCOUNTER — Telehealth: Payer: Self-pay | Admitting: Oncology

## 2017-10-06 ENCOUNTER — Inpatient Hospital Stay: Payer: 59

## 2017-10-06 ENCOUNTER — Inpatient Hospital Stay: Payer: 59 | Attending: Oncology

## 2017-10-06 ENCOUNTER — Inpatient Hospital Stay (HOSPITAL_BASED_OUTPATIENT_CLINIC_OR_DEPARTMENT_OTHER): Payer: 59 | Admitting: Oncology

## 2017-10-06 VITALS — BP 122/75 | HR 73 | Temp 98.0°F | Resp 16 | Wt 259.0 lb

## 2017-10-06 DIAGNOSIS — Z95828 Presence of other vascular implants and grafts: Secondary | ICD-10-CM

## 2017-10-06 DIAGNOSIS — C61 Malignant neoplasm of prostate: Secondary | ICD-10-CM | POA: Diagnosis not present

## 2017-10-06 DIAGNOSIS — Z79899 Other long term (current) drug therapy: Secondary | ICD-10-CM | POA: Insufficient documentation

## 2017-10-06 DIAGNOSIS — C7951 Secondary malignant neoplasm of bone: Secondary | ICD-10-CM | POA: Diagnosis not present

## 2017-10-06 DIAGNOSIS — Z7689 Persons encountering health services in other specified circumstances: Secondary | ICD-10-CM | POA: Insufficient documentation

## 2017-10-06 DIAGNOSIS — Z5111 Encounter for antineoplastic chemotherapy: Secondary | ICD-10-CM | POA: Insufficient documentation

## 2017-10-06 LAB — CBC WITH DIFFERENTIAL (CANCER CENTER ONLY)
Basophils Absolute: 0.1 10*3/uL (ref 0.0–0.1)
Basophils Relative: 1 %
EOS ABS: 0 10*3/uL (ref 0.0–0.5)
EOS PCT: 0 %
HCT: 38.2 % — ABNORMAL LOW (ref 38.4–49.9)
Hemoglobin: 12.7 g/dL — ABNORMAL LOW (ref 13.0–17.1)
LYMPHS ABS: 1 10*3/uL (ref 0.9–3.3)
LYMPHS PCT: 6 %
MCH: 28.9 pg (ref 27.2–33.4)
MCHC: 33.3 g/dL (ref 32.0–36.0)
MCV: 86.5 fL (ref 79.3–98.0)
Monocytes Absolute: 0.9 10*3/uL (ref 0.1–0.9)
Monocytes Relative: 6 %
Neutro Abs: 14.4 10*3/uL — ABNORMAL HIGH (ref 1.5–6.5)
Neutrophils Relative %: 87 %
PLATELETS: 264 10*3/uL (ref 140–400)
RBC: 4.42 MIL/uL (ref 4.20–5.82)
RDW: 18.5 % — ABNORMAL HIGH (ref 11.0–14.6)
WBC Count: 16.4 10*3/uL — ABNORMAL HIGH (ref 4.0–10.3)

## 2017-10-06 LAB — CMP (CANCER CENTER ONLY)
ALBUMIN: 3.5 g/dL (ref 3.5–5.0)
ALT: 17 U/L (ref 0–55)
AST: 11 U/L (ref 5–34)
Alkaline Phosphatase: 88 U/L (ref 40–150)
Anion gap: 7 (ref 3–11)
BUN: 24 mg/dL (ref 7–26)
CHLORIDE: 106 mmol/L (ref 98–109)
CO2: 27 mmol/L (ref 22–29)
CREATININE: 1.16 mg/dL (ref 0.70–1.30)
Calcium: 9.3 mg/dL (ref 8.4–10.4)
GFR, Est AFR Am: >60 mL/min (ref >60)
Glucose, Bld: 97 mg/dL (ref 70–140)
Potassium: 4.1 mmol/L (ref 3.5–5.1)
SODIUM: 140 mmol/L (ref 136–145)
Total Bilirubin: 0.6 mg/dL (ref 0.2–1.2)
Total Protein: 6.6 g/dL (ref 6.4–8.3)

## 2017-10-06 MED ORDER — SODIUM CHLORIDE 0.9 % IV SOLN
Freq: Once | INTRAVENOUS | Status: AC
  Administered 2017-10-06: 12:00:00 via INTRAVENOUS

## 2017-10-06 MED ORDER — SODIUM CHLORIDE 0.9 % IV SOLN
60.0000 mg/m2 | Freq: Once | INTRAVENOUS | Status: AC
  Administered 2017-10-06: 140 mg via INTRAVENOUS
  Filled 2017-10-06: qty 14

## 2017-10-06 MED ORDER — HEPARIN SOD (PORK) LOCK FLUSH 100 UNIT/ML IV SOLN
500.0000 [IU] | Freq: Once | INTRAVENOUS | Status: DC
  Filled 2017-10-06: qty 5

## 2017-10-06 MED ORDER — HEPARIN SOD (PORK) LOCK FLUSH 100 UNIT/ML IV SOLN
500.0000 [IU] | Freq: Once | INTRAVENOUS | Status: AC | PRN
  Administered 2017-10-06: 500 [IU]
  Filled 2017-10-06: qty 5

## 2017-10-06 MED ORDER — DEXAMETHASONE SODIUM PHOSPHATE 10 MG/ML IJ SOLN
10.0000 mg | Freq: Once | INTRAMUSCULAR | Status: AC
  Administered 2017-10-06: 10 mg via INTRAVENOUS

## 2017-10-06 MED ORDER — SODIUM CHLORIDE 0.9% FLUSH
10.0000 mL | INTRAVENOUS | Status: DC | PRN
  Administered 2017-10-06: 10 mL
  Filled 2017-10-06: qty 10

## 2017-10-06 MED ORDER — SODIUM CHLORIDE 0.9% FLUSH
10.0000 mL | INTRAVENOUS | Status: DC | PRN
  Administered 2017-10-06: 10 mL via INTRAVENOUS
  Filled 2017-10-06: qty 10

## 2017-10-06 MED ORDER — ALTEPLASE 2 MG IJ SOLR
2.0000 mg | Freq: Once | INTRAMUSCULAR | Status: DC | PRN
  Filled 2017-10-06: qty 2

## 2017-10-06 MED ORDER — DEXAMETHASONE SODIUM PHOSPHATE 10 MG/ML IJ SOLN
INTRAMUSCULAR | Status: AC
  Filled 2017-10-06: qty 1

## 2017-10-06 NOTE — Progress Notes (Signed)
Dr. Alen Blew awaiting fax from patient's dentist in regards to clearance for Xgeva. Dixie, RN over to infusion to give patient fax number so that [name] can fax information over. Patient stated he will also try and stop the dentist to get a copy of paperwork.

## 2017-10-06 NOTE — Patient Instructions (Signed)
[facility] Discharge Instructions for Patients Receiving Chemotherapy  Today you received the following chemotherapy agents: Taxotere  To help prevent nausea and vomiting after your treatment, we encourage you to take your nausea medication as directed.    If you develop nausea and vomiting that is not controlled by your nausea medication, call the clinic.   BELOW ARE SYMPTOMS THAT SHOULD BE REPORTED IMMEDIATELY:  *FEVER GREATER THAN 100.5 F  *CHILLS WITH OR WITHOUT FEVER  NAUSEA AND VOMITING THAT IS NOT CONTROLLED WITH YOUR NAUSEA MEDICATION  *UNUSUAL SHORTNESS OF BREATH  *UNUSUAL BRUISING OR BLEEDING  TENDERNESS IN MOUTH AND THROAT WITH OR WITHOUT PRESENCE OF ULCERS  *URINARY PROBLEMS  *BOWEL PROBLEMS  UNUSUAL RASH Items with * indicate a potential emergency and should be followed up as soon as possible.  Feel free to call the clinic should you have any questions or concerns. The clinic phone number is [phone].  Please show the CHEMO ALERT CARD at check-in to the Emergency Department and triage nurse.   

## 2017-10-06 NOTE — Telephone Encounter (Signed)
Scheduled appt per 4/12 los - Gave patient aVS and calender per los.

## 2017-10-06 NOTE — Progress Notes (Signed)
Hematology and Oncology Follow Up Visit  [name] [id number] August 10, 1960 57 y.o. 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   Prior Therapy:  He underwent a radical prostatectomy on 04/18/2011 under the care of Dr. Alinda Money. The final pathological staging was T3b N1 with 9 positive lymph nodes out of 11 examined.  He continued to have persistent PSA and was treated with androgen deprivation. His PSA became undetectable until August 2014.    In June 2016 PSA went up to 2.0 and subsequently to 5.6 in November 2016. Staging workup including a bone scan done on 05/15/2015 which showed a focus of increased uptake in the posterior parietal occipital region of the skull.   Xtandi started on 06/10/2015.  Therapy discontinued in September 2018 because of progression of disease.  Zytiga 1000 mg daily with prednisone at 5 mg daily started in October 2018.  Therapy discontinued in January 2019 because of poor response.  Current therapy:   Androgen deprivation utilizing Lupron 30 mg every 4 months.  Ross injection given on 07/25/2017 and will be repeated in 4 months after that.  Taxotere chemotherapy started on 08/04/2017. He received a 75 mg/m on cycle 1.  He is receiving subsequent cycles at 60 mg/m.  He is here for cycle 4 of therapy.  Interim History: [name] presents today for a follow-up visit.  He reports no major changes in his health since last visit.  He reports no complications related to chemotherapy including nausea, vomiting or diarrhea.  He does report grade 1 fatigue that resolves after a few days of chemotherapy.  He denies any arthralgias or myalgias.  His appetite and performance status remained reasonable.  He does report a recent his back after exerting himself for an extended period  of time.  He does not report any blurry vision, syncope or seizures. Does not report any fevers, chills, sweats or weight loss. Does not report any chest pain, palpitation, orthopnea or leg edema. Does not report any cough, hemoptysis or wheezing. Does not report any nausea, vomiting, abdominal pain. He denied any early satiety. Does not report any hematochezia, melena or early satiety. Does not report any hematuria or dysuria.   He denies any heat or cold intolerance.  He does not report any skin rashes or lesions.  He does not report any lymphadenopathy or petechiae. Remaining review of systems is negative.  Medications: I have reviewed the patient's current medications.  Current Outpatient Medications  Medication Sig Dispense Refill  . calcium-vitamin D (CALCIUM 500+D) 500-200 MG-UNIT per tablet Take 2 tablets by mouth daily.    Marland Kitchen HYDROcodone-acetaminophen (NORCO) 5-325 MG tablet 1/2 to 1 Q 4 hours prn pain 30 tablet 0  . leuprolide (LUPRON) 22.5 MG injection Inject 22.5 mg into the muscle every 3 (three) months.    . lidocaine-prilocaine (EMLA) cream Apply 1 application topically as needed. 30 g 0  . losartan-hydrochlorothiazide (HYZAAR) 100-12.5 MG tablet losartan 100 mg-hydrochlorothiazide 12.5 mg tablet  Take 1 tablet every day by oral route.    . predniSONE (DELTASONE) 5 MG tablet Take 1 tablet (5 mg total) by mouth daily with breakfast. 30 tablet 0  . prochlorperazine (COMPAZINE) 10 MG tablet Take 1 tablet (10 mg total) by mouth every 6 (six) hours as needed for nausea or vomiting.  30 tablet 0   No current facility-administered medications for this visit.    Facility-Administered Medications Ordered in Other Visits  Medication Dose Route Frequency Provider Last Rate Last Dose  . alteplase (CATHFLO ACTIVASE) injection 2 mg  2 mg Intracatheter Once PRN [name], MD      . heparin lock flush 100 unit/mL  500 Units Intravenous Once [name], MD      . sodium chloride flush  (NS) 0.9 % injection 10 mL  10 mL Intravenous PRN [name], MD   10 mL at 10/06/17 1112     Allergies:  Allergies  Allergen Reactions  . Lanolin Itching and Rash    Past Medical History, Surgical history, Social history reviewed today and updated.   Physical Exam:  Blood pressure 122/75, pulse 73, temperature 98 F (36.7 C), resp. rate 16, weight 259 lb (117.5 kg), SpO2 99 %.   ECOG: 0 General appearance: Well-appearing gentleman appeared comfortable. Head: Normocephalic without abnormalities. Oropharynx: No oral thrush or ulcers. Eyes: Pupils are equal and round reactive to light. Lymph nodes: No lymphadenopathy palpated in cervical, axillary or supraclavicular regions. Heart: Regular rate and rhythm without any murmurs or gallops. Lung: Clear in all lung fields without any rhonchi or wheezes.  No dullness to percussion. Abdomin: Soft, nondistended without any tenderness or rebound.  Good bowel sounds auscultated. Skin: No ecchymosis or petechiae. Musculoskeletal: No clubbing or cyanosis.  No joint deformity or effusion. Neurological: Intact motor and sensory exam.  Normal gait and speech.    Lab Results: Lab Results  Component Value Date   WBC 7.7 09/15/2017   HGB 15.9 07/28/2017   HCT 40.6 09/15/2017   MCV 85.3 09/15/2017   PLT 233 09/15/2017     Chemistry      Component Value Date/Time   NA 141 09/15/2017 0836   NA 144 06/08/2017 1423   K 3.9 09/15/2017 0836   K 3.4 (L) 06/08/2017 1423   CL 106 09/15/2017 0836   CO2 27 09/15/2017 0836   CO2 24 06/08/2017 1423   BUN 18 09/15/2017 0836   BUN 16.1 06/08/2017 1423   CREATININE 0.99 09/15/2017 0836   CREATININE 1.0 06/08/2017 1423      Component Value Date/Time   CALCIUM 9.4 09/15/2017 0836   CALCIUM 9.6 06/08/2017 1423   ALKPHOS 95 09/15/2017 0836   ALKPHOS 128 06/08/2017 1423   AST 8 09/15/2017 0836   AST 17 06/08/2017 1423   ALT 12 09/15/2017 0836   ALT 12 06/08/2017 1423   BILITOT 0.8  09/15/2017 0836   BILITOT 0.74 06/08/2017 1423       Results for Silsby, Robet (MRN [id number]) as of 10/06/2017 10:24  Ref. Range 07/24/2017 16:03 08/04/2017 07:42 08/25/2017 08:24 09/15/2017 08:36  Prostate Specific Ag, Serum Latest Ref Range: 0.0 - 4.0 ng/mL 38.4 (H) 43.9 (H) 68.1 (H) 89.9 (H)    Impression and Plan:  57 year old gentleman with the following issues:  1.  Castration resistant metastatic prostate cancer with disease to the bone.  He is status post therapies outlined above.   He is currently receiving Taxotere chemotherapy with no complications noted at the current dose.  His PSA continues to rise after 2 cycles of therapy.  His PSA from today is pending.  The natural course of this disease was discussed today and alternative options to Taxotere in case his PSA continues to rise.  Different chemotherapy regimen utilizing Jevtana would be an option.  My preference is to  obtain tissue biopsy of his prostate cancer metastasis site and obtain molecular testing. BRCA or other mutations could be responsible for his prostate cancer resistance and oral targeted therapy for these mutations may be a better option for him.  We will arrange for tissue biopsy to be obtained in the immediate future while he is getting treated with chemotherapy.    2. Androgen depravation: Risks and benefits of long-term androgen deprivation were reviewed and is agreeable to continue.  His next Lupron will be after Nov 22, 2017.   3. Bone directed therapy: Risks and benefits of Delton See has been reviewed and plan to start on 10/06/2017 and repeated every 4-6 weeks.  4.  IV access: Port-A-Cath remain in use without complications.  5. Family history of male breast cancer and personal history of prostate cancer.  6.  Neutropenia prophylaxis: He will receive growth factor support after each cycle of chemotherapy.  He has no objections to continuing this at this time.  7.  Nausea prophylaxis:  8.  Goals of care:  Aggressive therapy remains warranted at this time given his young age and excellent performance status.  9. Follow-up: Will be in 3 weeks to start the next cycle of chemotherapy.  25  minutes was spent with the patient face-to-face today.  More than 50% of time was dedicated to patient counseling, education and answering question regarding his future plans of care.   Zola Button, MD 4/12/201911:23 AM

## 2017-10-07 LAB — PROSTATE-SPECIFIC AG, SERUM (LABCORP): PROSTATE SPECIFIC AG, SERUM: 83.2 ng/mL — AB (ref 0.0–4.0)

## 2017-10-09 ENCOUNTER — Inpatient Hospital Stay: Payer: 59

## 2017-10-09 VITALS — BP 120/91 | HR 100 | Temp 97.9°F | Resp 18

## 2017-10-09 DIAGNOSIS — C61 Malignant neoplasm of prostate: Secondary | ICD-10-CM

## 2017-10-09 DIAGNOSIS — Z5111 Encounter for antineoplastic chemotherapy: Secondary | ICD-10-CM | POA: Diagnosis not present

## 2017-10-09 MED ORDER — PEGFILGRASTIM-CBQV 6 MG/0.6ML ~~LOC~~ SOSY
PREFILLED_SYRINGE | SUBCUTANEOUS | Status: AC
  Filled 2017-10-09: qty 0.6

## 2017-10-09 MED ORDER — PEGFILGRASTIM-CBQV 6 MG/0.6ML ~~LOC~~ SOSY
6.0000 mg | PREFILLED_SYRINGE | Freq: Once | SUBCUTANEOUS | Status: AC
  Administered 2017-10-09: 6 mg via SUBCUTANEOUS

## 2017-10-09 NOTE — Patient Instructions (Signed)
Pegfilgrastim injection What is this medicine? PEGFILGRASTIM (PEG fil gra stim) is a long-acting granulocyte colony-stimulating factor that stimulates the growth of neutrophils, a type of white blood cell important in the body's fight against infection. It is used to reduce the incidence of fever and infection in patients with certain types of cancer who are receiving chemotherapy that affects the bone marrow, and to increase survival after being exposed to high doses of radiation. This medicine may be used for other purposes; ask your health care provider or pharmacist if you have questions. COMMON BRAND NAME(S): Neulasta What should I tell my health care provider before I take this medicine? They need to know if you have any of these conditions: -kidney disease -latex allergy -ongoing radiation therapy -sickle cell disease -skin reactions to acrylic adhesives (On-Body Injector only) -an unusual or allergic reaction to pegfilgrastim, filgrastim, other medicines, foods, dyes, or preservatives -pregnant or trying to get pregnant -breast-feeding How should I use this medicine? This medicine is for injection under the skin. If you get this medicine at home, you will be taught how to prepare and give the pre-filled syringe or how to use the On-body Injector. Refer to the patient Instructions for Use for detailed instructions. Use exactly as directed. Tell your healthcare provider immediately if you suspect that the On-body Injector may not have performed as intended or if you suspect the use of the On-body Injector resulted in a missed or partial dose. It is important that you put your used needles and syringes in a special sharps container. Do not put them in a trash can. If you do not have a sharps container, call your pharmacist or healthcare provider to get one. Talk to your pediatrician regarding the use of this medicine in children. While this drug may be prescribed for selected conditions,  precautions do apply. Overdosage: If you think you have taken too much of this medicine contact a poison control center or emergency room at once. NOTE: This medicine is only for you. Do not share this medicine with others. What if I miss a dose? It is important not to miss your dose. Call your doctor or health care professional if you miss your dose. If you miss a dose due to an On-body Injector failure or leakage, a new dose should be administered as soon as possible using a single prefilled syringe for manual use. What may interact with this medicine? Interactions have not been studied. Give your health care provider a list of all the medicines, herbs, non-prescription drugs, or dietary supplements you use. Also tell them if you smoke, drink alcohol, or use illegal drugs. Some items may interact with your medicine. This list may not describe all possible interactions. Give your health care provider a list of all the medicines, herbs, non-prescription drugs, or dietary supplements you use. Also tell them if you smoke, drink alcohol, or use illegal drugs. Some items may interact with your medicine. What should I watch for while using this medicine? You may need blood work done while you are taking this medicine. If you are going to need a MRI, CT scan, or other procedure, tell your doctor that you are using this medicine (On-Body Injector only). What side effects may I notice from receiving this medicine? Side effects that you should report to your doctor or health care professional as soon as possible: -allergic reactions like skin rash, itching or hives, swelling of the face, lips, or tongue -dizziness -fever -pain, redness, or irritation at site   where injected -pinpoint red spots on the skin -red or dark-brown urine -shortness of breath or breathing problems -stomach or side pain, or pain at the shoulder -swelling -tiredness -trouble passing urine or change in the amount of urine Side  effects that usually do not require medical attention (report to your doctor or health care professional if they continue or are bothersome): -bone pain -muscle pain This list may not describe all possible side effects. Call your doctor for medical advice about side effects. You may report side effects to FDA at 1-800-FDA-1088. Where should I keep my medicine? Keep out of the reach of children. Store pre-filled syringes in a refrigerator between 2 and 8 degrees C (36 and 46 degrees F). Do not freeze. Keep in carton to protect from light. Throw away this medicine if it is left out of the refrigerator for more than 48 hours. Throw away any unused medicine after the expiration date. NOTE: This sheet is a summary. It may not cover all possible information. If you have questions about this medicine, talk to your doctor, pharmacist, or health care provider.  2018 [employer]vier/Gold Standard (2016-06-09 12:58:03)  

## 2017-10-10 ENCOUNTER — Telehealth: Payer: Self-pay | Admitting: *Deleted

## 2017-10-10 NOTE — Telephone Encounter (Signed)
-----   Message from [name], MD sent at 10/09/2017  9:26 AM EDT ----- Please let him know his PSA is down some.

## 2017-10-10 NOTE — Telephone Encounter (Signed)
As noted below by Dr. Shadad, I informed patient of his PSA level. He verbalized understanding.  

## 2017-10-17 ENCOUNTER — Other Ambulatory Visit: Payer: Self-pay | Admitting: Radiology

## 2017-10-18 ENCOUNTER — Ambulatory Visit (HOSPITAL_COMMUNITY)
Admission: RE | Admit: 2017-10-18 | Discharge: 2017-10-18 | Disposition: A | Discharge status: Home / Self Care | Payer: 59 | Source: Ambulatory Visit | Attending: Oncology | Admitting: Oncology

## 2017-10-18 ENCOUNTER — Encounter (HOSPITAL_COMMUNITY): Payer: Self-pay

## 2017-10-18 DIAGNOSIS — C61 Malignant neoplasm of prostate: Secondary | ICD-10-CM | POA: Diagnosis present

## 2017-10-18 DIAGNOSIS — C7951 Secondary malignant neoplasm of bone: Secondary | ICD-10-CM | POA: Insufficient documentation

## 2017-10-18 LAB — CBC
HCT: 37.7 % — ABNORMAL LOW (ref 39.0–52.0)
Hemoglobin: 12.2 g/dL — ABNORMAL LOW (ref 13.0–17.0)
MCH: 29.2 pg (ref 26.0–34.0)
MCHC: 32.4 g/dL (ref 30.0–36.0)
MCV: 90.2 fL (ref 78.0–100.0)
PLATELETS: 226 10*3/uL (ref 150–400)
RBC: 4.18 MIL/uL — ABNORMAL LOW (ref 4.22–5.81)
RDW: 18.1 % — ABNORMAL HIGH (ref 11.5–15.5)
WBC: 34.1 10*3/uL — ABNORMAL HIGH (ref 4.0–10.5)

## 2017-10-18 LAB — PROTIME-INR
INR: 0.96
Prothrombin Time: 12.7 seconds (ref 11.4–15.2)

## 2017-10-18 LAB — APTT: aPTT: 24 seconds (ref 24–36)

## 2017-10-18 MED ORDER — MIDAZOLAM HCL 2 MG/2ML IJ SOLN
INTRAMUSCULAR | Status: AC | PRN
  Administered 2017-10-18 (×2): 1 mg via INTRAVENOUS

## 2017-10-18 MED ORDER — MIDAZOLAM HCL 2 MG/2ML IJ SOLN
INTRAMUSCULAR | Status: AC
  Filled 2017-10-18: qty 4

## 2017-10-18 MED ORDER — FENTANYL CITRATE (PF) 100 MCG/2ML IJ SOLN
INTRAMUSCULAR | Status: AC | PRN
  Administered 2017-10-18 (×2): 50 ug via INTRAVENOUS

## 2017-10-18 MED ORDER — SODIUM CHLORIDE 0.9 % IV SOLN: INTRAVENOUS | Status: DC

## 2017-10-18 MED ORDER — FENTANYL CITRATE (PF) 100 MCG/2ML IJ SOLN
INTRAMUSCULAR | Status: AC
  Filled 2017-10-18: qty 4

## 2017-10-18 MED ORDER — LIDOCAINE HCL 1 % IJ SOLN
INTRAMUSCULAR | Status: AC
  Filled 2017-10-18: qty 20

## 2017-10-18 NOTE — Discharge Instructions (Signed)
°Needle Biopsy of the Bone, Care After °Refer to this sheet in the next few weeks. These instructions provide you with information about caring for yourself after your procedure. Your health care provider may also give you more specific instructions. Your treatment has been planned according to current medical practices, but problems sometimes occur. Call your health care provider if you have any problems or questions after your procedure. °What can I expect after the procedure? °After your procedure, it is common to have soreness or tenderness at the puncture site. °Follow these instructions at home: °· Take over-the-counter and prescription medicines only as told by your health care provider. °· Bathe and shower as told by your health care provider. °· Follow instructions from your health care provider about: °? How to take care of your puncture site. °? When and how you should change your bandage (dressing). °? When you should remove your dressing. °· Check your puncture site every day for signs of infection. Watch for: °? Redness, swelling, or worsening pain. °? Fluid, blood, or pus. °· Return to your normal activities as told by your health care provider. °· Keep all follow-up visits as told by your health care provider. This is important. °Contact a health care provider if: °· You have redness, swelling, or worsening pain at the site of your puncture. °· You have fluid, blood, or pus coming from your puncture site. °· You have a fever. °· You have persistent nausea or vomiting. °Get help right away if: °· You develop a rash. °· You have difficulty breathing. °This information is not intended to replace advice given to you by your health care provider. Make sure you discuss any questions you have with your health care provider. °Document Released: 12/31/2004 Document Revised: 11/19/2015 Document Reviewed: 07/21/2014 °Elsevier Interactive Patient Education © 2018 [employer] °Moderate Conscious Sedation,  Adult, Care After °These instructions provide you with information about caring for yourself after your procedure. Your health care provider may also give you more specific instructions. Your treatment has been planned according to current medical practices, but problems sometimes occur. Call your health care provider if you have any problems or questions after your procedure. °What can I expect after the procedure? °After your procedure, it is common: °· To feel sleepy for several hours. °· To feel clumsy and have poor balance for several hours. °· To have poor judgment for several hours. °· To vomit if you eat too soon. ° °Follow these instructions at home: °For at least 24 hours after the procedure: ° °· Do not: °? Participate in activities where you could fall or become injured. °? Drive. °? Use heavy machinery. °? Drink alcohol. °? Take sleeping pills or medicines that cause drowsiness. °? Make important decisions or sign legal documents. °? Take care of children on your own. °· Rest. °Eating and drinking °· Follow the diet recommended by your health care provider. °· If you vomit: °? Drink water, juice, or soup when you can drink without vomiting. °? Make sure you have little or no nausea before eating solid foods. °General instructions °· Have a responsible adult stay with you until you are awake and alert. °· Take over-the-counter and prescription medicines only as told by your health care provider. °· If you smoke, do not smoke without supervision. °· Keep all follow-up visits as told by your health care provider. This is important. °Contact a health care provider if: °· You keep feeling nauseous or you keep vomiting. °· You feel light-headed. °·   You develop a rash. °· You have a fever. °Get help right away if: °· You have trouble breathing. °This information is not intended to replace advice given to you by your health care provider. Make sure you discuss any questions you have with your health care  provider. °Document Released: 04/03/2013 Document Revised: 11/16/2015 Document Reviewed: 10/03/2015 °Elsevier Interactive Patient Education © 2018 [employer] ° °

## 2017-10-18 NOTE — Procedures (Signed)
R iliac bone lesion core Bx EBL 0 Comp 0

## 2017-10-18 NOTE — H&P (Signed)
Chief Complaint: Patient was seen in consultation today for iliac bone biopsy at the request of Shadad,Firas N  Referring Physician(s): [name]  Supervising Physician: Marybelle Killings  Patient Status: [facility] - Out-pt  History of Present Illness: [name] is a 57 y.o. male   Hx prostate cancer 2012 Castration resistant Rising PSA Bony metastasis  Bone scan 09/06/17:  Mild interval progression of metastatic disease to bone. New foci of uptake are seen involving the proximal left humerus and anterior right ilium. The other previously described areas abnormal skeletal uptake are stable.  Now scheduled for iliac bone bx    Past Medical History:  Diagnosis Date  . Cancer ([place])   . Hypertension     Past Surgical History:  Procedure Laterality Date  . IR FLUORO GUIDE PORT INSERTION RIGHT  07/28/2017  . IR US GUIDE VASC ACCESS RIGHT  07/28/2017    Allergies: Lanolin  Medications: Prior to Admission medications   Medication Sig Start Date End Date Taking? Authorizing Provider  calcium-vitamin D (CALCIUM 500+D) 500-200 MG-UNIT per tablet Take 2 tablets by mouth daily.   Yes [provider]  HYDROcodone-acetaminophen (NORCO) 5-325 MG tablet 1/2 to 1 Q 4 hours prn pain Patient taking differently: Take 0.5-1 tablets by mouth every 4 (four) hours as needed for moderate pain. 1/2 to 1 Q 4 hours prn pain 08/07/17  Yes Tanner, Lyndon Code., PA-C  lidocaine-prilocaine (EMLA) cream Apply 1 application topically as needed. Patient taking differently: Apply 1 application topically as needed.  07/25/17  Yes [name], MD  losartan-hydrochlorothiazide (HYZAAR) 100-12.5 MG tablet Take 1 tablet by mouth daily.   Yes [provider]  predniSONE (DELTASONE) 5 MG tablet Take 1 tablet (5 mg total) by mouth daily with breakfast. 09/25/17  Yes Shadad, Mathis Dad, MD  prochlorperazine (COMPAZINE) 10 MG tablet Take 1 tablet (10 mg total) by mouth every 6 (six) hours as needed  for nausea or vomiting. 07/25/17  Yes [name], MD  leuprolide (LUPRON) 22.5 MG injection Inject 22.5 mg into the muscle every 4 (four) months.     [provider]     History reviewed. No pertinent family history.  Social History   Socioeconomic History  . Marital status: Married    Spouse name: Not on file  . Number of children: Not on file  . Years of education: Not on file  . Highest education level: Not on file  Occupational History  . Not on file  Social Needs  . Financial resource strain: Not on file  . Food insecurity:    Worry: Not on file    Inability: Not on file  . Transportation needs:    Medical: Not on file    Non-medical: Not on file  Tobacco Use  . Smoking status: Former Research scientist (life sciences)  . Smokeless tobacco: Never Used  Substance and Sexual Activity  . Alcohol use: Not on file  . Drug use: Not on file  . Sexual activity: Not on file  Lifestyle  . Physical activity:    Days per week: Not on file    Minutes per session: Not on file  . Stress: Not on file  Relationships  . Social connections:    Talks on phone: Not on file    Gets together: Not on file    Attends religious service: Not on file    Active member of club or organization: Not on file    Attends meetings of clubs or organizations: Not  on file    Relationship status: Not on file  Other Topics Concern  . Not on file  Social History Narrative  . Not on file    Review of Systems: A 12 point ROS discussed and pertinent positives are indicated in the HPI above.  All other systems are negative.  Review of Systems  Constitutional: Negative for activity change, fatigue, fever and unexpected weight change.  Respiratory: Negative for cough and shortness of breath.   Cardiovascular: Negative for chest pain.  Gastrointestinal: Negative for abdominal pain.  Musculoskeletal: Negative for back pain and gait problem.  Neurological: Negative for weakness.  Psychiatric/Behavioral: Negative for  behavioral problems and confusion.    Vital Signs: BP (!) 151/95   Pulse 69   Temp 98.5 F (36.9 C)   Resp 20   Ht 5\' 9"  (1.753 m)   Wt 258 lb (117 kg)   SpO2 100%   BMI 38.10 kg/m   Physical Exam  Constitutional: He is oriented to person, place, and time.  Cardiovascular: Normal rate, regular rhythm and normal heart sounds.  Pulmonary/Chest: Effort normal and breath sounds normal.  Abdominal: Soft. Bowel sounds are normal.  Musculoskeletal: Normal range of motion.  Neurological: He is alert and oriented to person, place, and time.  Skin: Skin is warm and dry.  Psychiatric: He has a normal mood and affect. His behavior is normal. Judgment and thought content normal.  Nursing note and vitals reviewed.   Imaging: No results found.  Labs:  CBC: Recent Labs    08/04/17 0742 08/25/17 0824 09/15/17 0836 10/06/17 1024  WBC 9.0 9.8 7.7 16.4*  HGB 15.5 14.3 13.5 12.7*  HCT 47.4 42.9 40.6 38.2*  PLT 242 380 233 264    COAGS: Recent Labs    07/28/17 0746  INR 0.89  APTT 26    BMP: Recent Labs    08/04/17 0742 08/25/17 0824 09/15/17 0836 10/06/17 1024  NA 141 142 141 140  K 3.7 3.9 3.9 4.1  CL 106 106 106 106  CO2 25 26 27 27   GLUCOSE 135 102 91 97  BUN 16 18 18 24   CALCIUM 9.5 9.6 9.4 9.3  CREATININE 0.99 1.06 0.99 1.16  GFRNONAA >60 >60 >60 >60  GFRAA >60 >60 >60 >60    LIVER FUNCTION TESTS: Recent Labs    08/04/17 0742 08/25/17 0824 09/15/17 0836 10/06/17 1024  BILITOT 0.7 0.7 0.8 0.6  AST 9 8 8 11   ALT 10 17 12 17   ALKPHOS 137 103 95 88  PROT 6.9 6.7 6.6 6.6  ALBUMIN 3.5 3.5 3.5 3.5    TUMOR MARKERS: No results for input(s): AFPTM, CEA, CA199, CHROMGRNA in the last 8760 hours.  Assessment and Plan:  Hx Prostate ca Castration 2012 Resistant cancer Rising PSA Now scheduled for iliac bone lesion biopsy Risks and benefits discussed with the patient including, but not limited to bleeding, infection, damage to adjacent structures or  low yield requiring additional tests.  All of the patient's questions were answered, patient is agreeable to proceed. Consent signed and in chart.   Thank you for this interesting consult.  I greatly enjoyed meeting [name] and look forward to participating in their care.  A copy of this report was sent to the requesting provider on this date.  Electronically Signed: Lavonia Drafts, PA-C 10/18/2017, 10:28 AM   I spent a total of  30 Minutes   in face to face in clinical consultation, greater than 50% of which was  counseling/coordinating care for iliac bone lesion bx

## 2017-10-24 ENCOUNTER — Other Ambulatory Visit: Payer: Self-pay | Admitting: Oncology

## 2017-10-24 DIAGNOSIS — C61 Malignant neoplasm of prostate: Secondary | ICD-10-CM

## 2017-10-26 ENCOUNTER — Other Ambulatory Visit: Payer: Self-pay | Admitting: *Deleted

## 2017-10-26 DIAGNOSIS — C61 Malignant neoplasm of prostate: Secondary | ICD-10-CM

## 2017-10-27 ENCOUNTER — Inpatient Hospital Stay: Payer: 59 | Attending: Oncology

## 2017-10-27 ENCOUNTER — Telehealth: Payer: Self-pay | Admitting: Oncology

## 2017-10-27 ENCOUNTER — Inpatient Hospital Stay: Payer: 59

## 2017-10-27 ENCOUNTER — Inpatient Hospital Stay (HOSPITAL_BASED_OUTPATIENT_CLINIC_OR_DEPARTMENT_OTHER): Payer: 59 | Admitting: Oncology

## 2017-10-27 VITALS — BP 124/76 | HR 77 | Temp 98.6°F | Resp 19 | Ht 69.0 in | Wt 257.0 lb

## 2017-10-27 DIAGNOSIS — C61 Malignant neoplasm of prostate: Secondary | ICD-10-CM

## 2017-10-27 DIAGNOSIS — Z5111 Encounter for antineoplastic chemotherapy: Secondary | ICD-10-CM | POA: Insufficient documentation

## 2017-10-27 DIAGNOSIS — Z7689 Persons encountering health services in other specified circumstances: Secondary | ICD-10-CM | POA: Diagnosis not present

## 2017-10-27 DIAGNOSIS — C7951 Secondary malignant neoplasm of bone: Secondary | ICD-10-CM | POA: Insufficient documentation

## 2017-10-27 LAB — CBC WITH DIFFERENTIAL (CANCER CENTER ONLY)
Basophils Absolute: 0 10*3/uL (ref 0.0–0.1)
Basophils Relative: 0 %
EOS ABS: 0 10*3/uL (ref 0.0–0.5)
Eosinophils Relative: 0 %
HCT: 41 % (ref 38.4–49.9)
HEMOGLOBIN: 13.2 g/dL (ref 13.0–17.1)
LYMPHS ABS: 1 10*3/uL (ref 0.9–3.3)
LYMPHS PCT: 12 %
MCH: 29.5 pg (ref 27.2–33.4)
MCHC: 32.2 g/dL (ref 32.0–36.0)
MCV: 91.5 fL (ref 79.3–98.0)
MONOS PCT: 9 %
Monocytes Absolute: 0.7 10*3/uL (ref 0.1–0.9)
NEUTROS PCT: 79 %
Neutro Abs: 6.8 10*3/uL — ABNORMAL HIGH (ref 1.5–6.5)
PLATELETS: 283 10*3/uL (ref 140–400)
RBC: 4.48 MIL/uL (ref 4.20–5.82)
RDW: 18.4 % — AB (ref 11.0–14.6)
WBC: 8.6 10*3/uL (ref 4.0–10.3)

## 2017-10-27 LAB — CMP (CANCER CENTER ONLY)
ALT: 18 U/L (ref 0–55)
AST: 11 U/L (ref 5–34)
Albumin: 4 g/dL (ref 3.5–5.0)
Alkaline Phosphatase: 94 U/L (ref 40–150)
Anion gap: 6 (ref 3–11)
BUN: 22 mg/dL (ref 7–26)
CO2: 27 mmol/L (ref 22–29)
CREATININE: 1.02 mg/dL (ref 0.70–1.30)
Calcium: 9.7 mg/dL (ref 8.4–10.4)
Chloride: 105 mmol/L (ref 98–109)
Glucose, Bld: 94 mg/dL (ref 70–140)
POTASSIUM: 4.1 mmol/L (ref 3.5–5.1)
SODIUM: 138 mmol/L (ref 136–145)
Total Bilirubin: 0.9 mg/dL (ref 0.2–1.2)
Total Protein: 6.9 g/dL (ref 6.4–8.3)

## 2017-10-27 MED ORDER — DEXAMETHASONE SODIUM PHOSPHATE 10 MG/ML IJ SOLN
10.0000 mg | Freq: Once | INTRAMUSCULAR | Status: AC
  Administered 2017-10-27: 10 mg via INTRAVENOUS

## 2017-10-27 MED ORDER — DEXAMETHASONE SODIUM PHOSPHATE 10 MG/ML IJ SOLN
INTRAMUSCULAR | Status: AC
  Filled 2017-10-27: qty 1

## 2017-10-27 MED ORDER — SODIUM CHLORIDE 0.9% FLUSH
10.0000 mL | INTRAVENOUS | Status: DC | PRN
  Filled 2017-10-27: qty 10

## 2017-10-27 MED ORDER — SODIUM CHLORIDE 0.9 % IV SOLN
Freq: Once | INTRAVENOUS | Status: AC
  Administered 2017-10-27: 11:00:00 via INTRAVENOUS

## 2017-10-27 MED ORDER — DOCETAXEL CHEMO INJECTION 160 MG/16ML
60.0000 mg/m2 | Freq: Once | INTRAVENOUS | Status: AC
  Administered 2017-10-27: 140 mg via INTRAVENOUS
  Filled 2017-10-27: qty 14

## 2017-10-27 MED ORDER — SODIUM CHLORIDE 0.9 % IJ SOLN
10.0000 mL | Freq: Once | INTRAMUSCULAR | Status: AC
  Administered 2017-10-27: 10 mL via INTRAVENOUS
  Filled 2017-10-27: qty 10

## 2017-10-27 MED ORDER — HEPARIN SOD (PORK) LOCK FLUSH 100 UNIT/ML IV SOLN
500.0000 [IU] | Freq: Once | INTRAVENOUS | Status: DC | PRN
Start: 2017-10-27 — End: 2017-10-27
  Filled 2017-10-27: qty 5

## 2017-10-27 NOTE — Progress Notes (Signed)
Hematology and Oncology Follow Up Visit  [name] [id number] 12/10/1960 57 y.o. 10/27/2017 10:08 AM   Principle Diagnosis: 57 year old man with castration-resistant prostate cancer with disease to the bone.  He was initially diagnosed with Gleason score of 4+5 = 9 and a PSA of 16.6 and 2012.   Prior Therapy:  He underwent a radical prostatectomy on 04/18/2011 under the care of Dr. Alinda Money. The final pathological staging was T3b N1 with 9 positive lymph nodes out of 11 examined.  He continued to have persistent PSA and was treated with androgen deprivation. His PSA became undetectable until August 2014.    In June 2016 PSA went up to 2.0 and subsequently to 5.6 in November 2016. Staging workup including a bone scan done on 05/15/2015 which showed a focus of increased uptake in the posterior parietal occipital region of the skull.   Xtandi started on 06/10/2015.  Therapy discontinued in September 2018 because of progression of disease.  Zytiga 1000 mg daily with prednisone at 5 mg daily started in October 2018.  Therapy discontinued in January 2019 because of poor response.  Current therapy:   Androgen deprivation utilizing Lupron 30 mg every 4 months.  His next injection is due in June 2019.  Taxotere chemotherapy started on 08/04/2017. He received a 75 mg/m on cycle 1.  He is receiving subsequent cycles at 60 mg/m.  He is here for cycle 5 of therapy.  Interim History: [name] is here for a follow-up visit.  Since last visit, he continues to tolerate Taxotere chemotherapy without complications.  He denies any nausea, vomiting or peripheral neuropathy.  He denies any infusion related complications.  He remains active and attends to certain activities of daily living.  He does report rib cage pain associated with excessive activity or lifting heavy objects.  He is appetite remain excellent and his performance status is unchanged.  He denies any other bone pain including back pain or hip  pain.Marland Kitchen  He does not report any blurry vision, syncope or seizures. Does not report any fevers, chills, sweats or weight loss.  Appetite remained reasonable.  Does not report any chest pain, palpitation, orthopnea or leg edema. Does not report any cough, hemoptysis or wheezing. Does not report any nausea, vomiting, abdominal pain. Does not report any hematochezia, melena or early satiety. Does not report any hematuria or dysuria. He does not report any rash or ecchymosis.  He does not report any lymphadenopathy or petechiae. Remaining review of systems is negative.  Medications: I have reviewed the patient's current medications.  Current Outpatient Medications  Medication Sig Dispense Refill  . calcium-vitamin D (CALCIUM 500+D) 500-200 MG-UNIT per tablet Take 2 tablets by mouth daily.    Marland Kitchen leuprolide (LUPRON) 22.5 MG injection Inject 22.5 mg into the muscle every 4 (four) months.     . lidocaine-prilocaine (EMLA) cream Apply 1 application topically as needed. (Patient taking differently: Apply 1 application topically as needed. ) 30 g 0  . losartan-hydrochlorothiazide (HYZAAR) 100-12.5 MG tablet Take 1 tablet by mouth daily.    . predniSONE (DELTASONE) 5 MG tablet TAKE 1 TABLET BY MOUTH EVERY DAY WITH BREAKFAST 30 tablet 0  . prochlorperazine (COMPAZINE) 10 MG tablet Take 1 tablet (10 mg total) by mouth every 6 (six) hours as needed for nausea or vomiting. 30 tablet 0  . HYDROcodone-acetaminophen (NORCO) 5-325 MG tablet 1/2 to 1 Q 4 hours prn pain (Patient not taking: Reported on 10/27/2017) 30 tablet 0   No current facility-administered  medications for this visit.      Allergies:  Allergies  Allergen Reactions  . Lanolin Itching and Rash    Past Medical History, Surgical history, Social history reviewed today and updated.   Physical Exam:  Blood pressure 124/76, pulse 77, temperature 98.6 F (37 C), temperature source Oral, resp. rate 19, height 5\' 9"  (1.753 m), weight 257 lb (116.6 kg),  SpO2 98 %.   ECOG: 0 General appearance: Alert, awake gentleman without distress. Head: Atraumatic without abnormalities. Oropharynx: Mucous membranes are moist and pink. Eyes: Sclera anicteric. Lymph nodes: No cervical, axillary or supraclavicular lymphadenopathy. Heart: Regular rate no murmurs or gallops.   Lung: Clear without any wheezes or dullness to percussion. Abdomin: Soft, nondistended without any rebound or guarding. Skin: No petechia or ecchymosis. Musculoskeletal: Full range of motion noted all joints.  No clubbing or cyanosis. Neurological: No motor or sensory deficits.    Lab Results: Lab Results  Component Value Date   WBC 8.6 10/27/2017   HGB 13.2 10/27/2017   HCT 41.0 10/27/2017   MCV 91.5 10/27/2017   PLT 283 10/27/2017     Chemistry      Component Value Date/Time   NA 140 10/06/2017 1024   NA 144 06/08/2017 1423   K 4.1 10/06/2017 1024   K 3.4 (L) 06/08/2017 1423   CL 106 10/06/2017 1024   CO2 27 10/06/2017 1024   CO2 24 06/08/2017 1423   BUN 24 10/06/2017 1024   BUN 16.1 06/08/2017 1423   CREATININE 1.16 10/06/2017 1024   CREATININE 1.0 06/08/2017 1423      Component Value Date/Time   CALCIUM 9.3 10/06/2017 1024   CALCIUM 9.6 06/08/2017 1423   ALKPHOS 88 10/06/2017 1024   ALKPHOS 128 06/08/2017 1423   AST 11 10/06/2017 1024   AST 17 06/08/2017 1423   ALT 17 10/06/2017 1024   ALT 12 06/08/2017 1423   BILITOT 0.6 10/06/2017 1024   BILITOT 0.74 06/08/2017 1423       Results for [name], [name] (MRN [id number]) as of 10/27/2017 09:51  Ref. Range 08/04/2017 07:42 08/25/2017 08:24 09/15/2017 08:36 10/06/2017 10:24  Prostate Specific Ag, Serum Latest Ref Range: 0.0 - 4.0 ng/mL 43.9 (H) 68.1 (H) 89.9 (H) 83.2 (H)     Impression and Plan:  57 year old man with:   1.  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    He remains on Taxotere chemotherapy without complications at the current dose.  His PSA showed a  reasonable response since the last cycle with decline of his PSA to 83.2.   Risks and benefits of continuing chemotherapy was reviewed today and he is agreeable to continue.  He underwent a bone biopsy which she completed without complications and the results will be sent for foundation medicine testing.  I have also requested potentially his original tumor to be studied if no sufficient material is available.  The implication of these results discussed with the patient and his wife.  I explained to him that could serve as a therapeutic option if he has a targetable mutation after chemotherapy.  This could be also an option if chemotherapy is less than effective.    2. Androgen depravation: He will continue on Lupron and will receive the next one in June 2019.   3. Bone directed therapy: He will receive Xgeva on May 6 and every 6 weeks after that.  He obtain dental clearance and currently on calcium and vitamin D supplements.  Long-term complication associated  with this therapy was reviewed again and is agreeable to proceed.  4.  IV access: Port-A-Cath was accessed today and remain in use without complications.  5.  Neutropenia prophylaxis: He is currently receiving growth factor support after each cycle of chemotherapy.  This will continue at this time.  7.  Nausea prophylaxis: Prescription for antiemetics is available to him.  8.  Goals of care: He has an incurable malignancy but disease that can be palliated for an extended period of time.  His performance status remain excellent.  9. Follow-up: Will be in 3 weeks to start the next cycle of chemotherapy.  25  minutes was spent with the patient face-to-face today.  More than 50% of time was dedicated to patient counseling, education and coordinating his multifaceted care.  Zola Button, MD 5/3/201910:08 AM

## 2017-10-27 NOTE — Telephone Encounter (Signed)
Scheduled appt per 5/3 los - Gave patient calender.

## 2017-10-27 NOTE — Patient Instructions (Signed)
[facility] Discharge Instructions for Patients Receiving Chemotherapy  Today you received the following chemotherapy agents: Taxotere  To help prevent nausea and vomiting after your treatment, we encourage you to take your nausea medication as directed.    If you develop nausea and vomiting that is not controlled by your nausea medication, call the clinic.   BELOW ARE SYMPTOMS THAT SHOULD BE REPORTED IMMEDIATELY:  *FEVER GREATER THAN 100.5 F  *CHILLS WITH OR WITHOUT FEVER  NAUSEA AND VOMITING THAT IS NOT CONTROLLED WITH YOUR NAUSEA MEDICATION  *UNUSUAL SHORTNESS OF BREATH  *UNUSUAL BRUISING OR BLEEDING  TENDERNESS IN MOUTH AND THROAT WITH OR WITHOUT PRESENCE OF ULCERS  *URINARY PROBLEMS  *BOWEL PROBLEMS  UNUSUAL RASH Items with * indicate a potential emergency and should be followed up as soon as possible.  Feel free to call the clinic should you have any questions or concerns. The clinic phone number is [phone].  Please show the CHEMO ALERT CARD at check-in to the Emergency Department and triage nurse.   

## 2017-10-27 NOTE — Patient Instructions (Signed)

## 2017-10-28 LAB — PROSTATE-SPECIFIC AG, SERUM (LABCORP): PROSTATE SPECIFIC AG, SERUM: 83.8 ng/mL — AB (ref 0.0–4.0)

## 2017-10-30 ENCOUNTER — Inpatient Hospital Stay: Payer: 59

## 2017-10-30 ENCOUNTER — Telehealth: Payer: Self-pay | Admitting: *Deleted

## 2017-10-30 VITALS — BP 137/84 | HR 100 | Temp 98.4°F | Resp 20

## 2017-10-30 DIAGNOSIS — C61 Malignant neoplasm of prostate: Secondary | ICD-10-CM

## 2017-10-30 DIAGNOSIS — Z5111 Encounter for antineoplastic chemotherapy: Secondary | ICD-10-CM | POA: Diagnosis not present

## 2017-10-30 MED ORDER — PEGFILGRASTIM-CBQV 6 MG/0.6ML ~~LOC~~ SOSY
PREFILLED_SYRINGE | SUBCUTANEOUS | Status: AC
  Filled 2017-10-30: qty 0.6

## 2017-10-30 MED ORDER — DENOSUMAB 120 MG/1.7ML ~~LOC~~ SOLN
120.0000 mg | Freq: Once | SUBCUTANEOUS | Status: AC
  Administered 2017-10-30: 120 mg via SUBCUTANEOUS

## 2017-10-30 MED ORDER — PEGFILGRASTIM-CBQV 6 MG/0.6ML ~~LOC~~ SOSY
6.0000 mg | PREFILLED_SYRINGE | Freq: Once | SUBCUTANEOUS | Status: AC
  Administered 2017-10-30: 6 mg via SUBCUTANEOUS

## 2017-10-30 NOTE — Telephone Encounter (Signed)
-----   Message from [name], MD sent at 10/30/2017  8:30 AM EDT ----- Please let him know his PSA is stable.

## 2017-10-30 NOTE — Patient Instructions (Signed)
Pegfilgrastim injection What is this medicine? PEGFILGRASTIM (PEG fil gra stim) is a long-acting granulocyte colony-stimulating factor that stimulates the growth of neutrophils, a type of white blood cell important in the body's fight against infection. It is used to reduce the incidence of fever and infection in patients with certain types of cancer who are receiving chemotherapy that affects the bone marrow, and to increase survival after being exposed to high doses of radiation. This medicine may be used for other purposes; ask your health care provider or pharmacist if you have questions. COMMON BRAND NAME(S): Neulasta What should I tell my health care provider before I take this medicine? They need to know if you have any of these conditions: -kidney disease -latex allergy -ongoing radiation therapy -sickle cell disease -skin reactions to acrylic adhesives (On-Body Injector only) -an unusual or allergic reaction to pegfilgrastim, filgrastim, other medicines, foods, dyes, or preservatives -pregnant or trying to get pregnant -breast-feeding How should I use this medicine? This medicine is for injection under the skin. If you get this medicine at home, you will be taught how to prepare and give the pre-filled syringe or how to use the On-body Injector. Refer to the patient Instructions for Use for detailed instructions. Use exactly as directed. Tell your healthcare provider immediately if you suspect that the On-body Injector may not have performed as intended or if you suspect the use of the On-body Injector resulted in a missed or partial dose. It is important that you put your used needles and syringes in a special sharps container. Do not put them in a trash can. If you do not have a sharps container, call your pharmacist or healthcare provider to get one. Talk to your pediatrician regarding the use of this medicine in children. While this drug may be prescribed for selected conditions,  precautions do apply. Overdosage: If you think you have taken too much of this medicine contact a poison control center or emergency room at once. NOTE: This medicine is only for you. Do not share this medicine with others. What if I miss a dose? It is important not to miss your dose. Call your doctor or health care professional if you miss your dose. If you miss a dose due to an On-body Injector failure or leakage, a new dose should be administered as soon as possible using a single prefilled syringe for manual use. What may interact with this medicine? Interactions have not been studied. Give your health care provider a list of all the medicines, herbs, non-prescription drugs, or dietary supplements you use. Also tell them if you smoke, drink alcohol, or use illegal drugs. Some items may interact with your medicine. This list may not describe all possible interactions. Give your health care provider a list of all the medicines, herbs, non-prescription drugs, or dietary supplements you use. Also tell them if you smoke, drink alcohol, or use illegal drugs. Some items may interact with your medicine. What should I watch for while using this medicine? You may need blood work done while you are taking this medicine. If you are going to need a MRI, CT scan, or other procedure, tell your doctor that you are using this medicine (On-Body Injector only). What side effects may I notice from receiving this medicine? Side effects that you should report to your doctor or health care professional as soon as possible: -allergic reactions like skin rash, itching or hives, swelling of the face, lips, or tongue -dizziness -fever -pain, redness, or irritation at site   where injected -pinpoint red spots on the skin -red or dark-brown urine -shortness of breath or breathing problems -stomach or side pain, or pain at the shoulder -swelling -tiredness -trouble passing urine or change in the amount of urine Side  effects that usually do not require medical attention (report to your doctor or health care professional if they continue or are bothersome): -bone pain -muscle pain This list may not describe all possible side effects. Call your doctor for medical advice about side effects. You may report side effects to FDA at 1-800-FDA-1088. Where should I keep my medicine? Keep out of the reach of children. Store pre-filled syringes in a refrigerator between 2 and 8 degrees C (36 and 46 degrees F). Do not freeze. Keep in carton to protect from light. Throw away this medicine if it is left out of the refrigerator for more than 48 hours. Throw away any unused medicine after the expiration date. NOTE: This sheet is a summary. It may not cover all possible information. If you have questions about this medicine, talk to your doctor, pharmacist, or health care provider.  2018 Elsevier/Gold Standard (2016-06-09 12:58:03) Denosumab injection What is this medicine? DENOSUMAB (den oh sue mab) slows bone breakdown. Prolia is used to treat osteoporosis in women after menopause and in men. Delton See is used to treat a high calcium level due to cancer and to prevent bone fractures and other bone problems caused by multiple myeloma or cancer bone metastases. Delton See is also used to treat giant cell tumor of the bone. This medicine may be used for other purposes; ask your health care provider or pharmacist if you have questions. COMMON BRAND NAME(S): Prolia, XGEVA What should I tell my health care provider before I take this medicine? They need to know if you have any of these conditions: -dental disease -having surgery or tooth extraction -infection -kidney disease -low levels of calcium or Vitamin D in the blood -malnutrition -on hemodialysis -skin conditions or sensitivity -thyroid or parathyroid disease -an unusual reaction to denosumab, other medicines, foods, dyes, or preservatives -pregnant or trying to get  pregnant -breast-feeding How should I use this medicine? This medicine is for injection under the skin. It is given by a health care professional in a hospital or clinic setting. If you are getting Prolia, a special MedGuide will be given to you by the pharmacist with each prescription and refill. Be sure to read this information carefully each time. For Prolia, talk to your pediatrician regarding the use of this medicine in children. Special care may be needed. For Delton See, talk to your pediatrician regarding the use of this medicine in children. While this drug may be prescribed for children as young as 13 years for selected conditions, precautions do apply. Overdosage: If you think you have taken too much of this medicine contact a poison control center or emergency room at once. NOTE: This medicine is only for you. Do not share this medicine with others. What if I miss a dose? It is important not to miss your dose. Call your doctor or health care professional if you are unable to keep an appointment. What may interact with this medicine? Do not take this medicine with any of the following medications: -other medicines containing denosumab This medicine may also interact with the following medications: -medicines that lower your chance of fighting infection -steroid medicines like prednisone or cortisone This list may not describe all possible interactions. Give your health care provider a list of all the medicines, herbs,  non-prescription drugs, or dietary supplements you use. Also tell them if you smoke, drink alcohol, or use illegal drugs. Some items may interact with your medicine. What should I watch for while using this medicine? Visit your doctor or health care professional for regular checks on your progress. Your doctor or health care professional may order blood tests and other tests to see how you are doing. Call your doctor or health care professional for advice if you get a fever,  chills or sore throat, or other symptoms of a cold or flu. Do not treat yourself. This drug may decrease your body's ability to fight infection. Try to avoid being around people who are sick. You should make sure you get enough calcium and vitamin D while you are taking this medicine, unless your doctor tells you not to. Discuss the foods you eat and the vitamins you take with your health care professional. See your dentist regularly. Brush and floss your teeth as directed. Before you have any dental work done, tell your dentist you are receiving this medicine. Do not become pregnant while taking this medicine or for 5 months after stopping it. Talk with your doctor or health care professional about your birth control options while taking this medicine. Women should inform their doctor if they wish to become pregnant or think they might be pregnant. There is a potential for serious side effects to an unborn child. Talk to your health care professional or pharmacist for more information. What side effects may I notice from receiving this medicine? Side effects that you should report to your doctor or health care professional as soon as possible: -allergic reactions like skin rash, itching or hives, swelling of the face, lips, or tongue -bone pain -breathing problems -dizziness -jaw pain, especially after dental work -redness, blistering, peeling of the skin -signs and symptoms of infection like fever or chills; cough; sore throat; pain or trouble passing urine -signs of low calcium like fast heartbeat, muscle cramps or muscle pain; pain, tingling, numbness in the hands or feet; seizures -unusual bleeding or bruising -unusually weak or tired Side effects that usually do not require medical attention (report to your doctor or health care professional if they continue or are bothersome): -constipation -diarrhea -headache -joint pain -loss of appetite -muscle pain -runny nose -tiredness -upset  stomach This list may not describe all possible side effects. Call your doctor for medical advice about side effects. You may report side effects to FDA at 1-800-FDA-1088. Where should I keep my medicine? This medicine is only given in a clinic, doctor's office, or other health care setting and will not be stored at home. NOTE: This sheet is a summary. It may not cover all possible information. If you have questions about this medicine, talk to your doctor, pharmacist, or health care provider.  2018 Elsevier/Gold Standard (2016-07-05 19:17:21)

## 2017-10-30 NOTE — Telephone Encounter (Signed)
Per Dr. Alen Blew, called pt to inform PSA results are unchanged/stable.  Pt verbalized understanding/thankful for call.

## 2017-10-31 ENCOUNTER — Telehealth: Payer: Self-pay | Admitting: *Deleted

## 2017-10-31 NOTE — Telephone Encounter (Signed)
Spoke with wife linda. Gave results of last PSA

## 2017-11-02 ENCOUNTER — Encounter (HOSPITAL_COMMUNITY): Payer: Self-pay | Admitting: Oncology

## 2017-11-17 ENCOUNTER — Inpatient Hospital Stay: Payer: 59

## 2017-11-17 ENCOUNTER — Telehealth: Payer: Self-pay

## 2017-11-17 ENCOUNTER — Inpatient Hospital Stay (HOSPITAL_BASED_OUTPATIENT_CLINIC_OR_DEPARTMENT_OTHER): Payer: 59 | Admitting: Oncology

## 2017-11-17 VITALS — BP 125/75 | HR 68 | Temp 98.7°F | Resp 17 | Ht 69.0 in | Wt 257.6 lb

## 2017-11-17 DIAGNOSIS — C61 Malignant neoplasm of prostate: Secondary | ICD-10-CM

## 2017-11-17 DIAGNOSIS — C7951 Secondary malignant neoplasm of bone: Secondary | ICD-10-CM | POA: Diagnosis not present

## 2017-11-17 DIAGNOSIS — Z5111 Encounter for antineoplastic chemotherapy: Secondary | ICD-10-CM | POA: Diagnosis not present

## 2017-11-17 DIAGNOSIS — Z95828 Presence of other vascular implants and grafts: Secondary | ICD-10-CM

## 2017-11-17 LAB — CBC WITH DIFFERENTIAL (CANCER CENTER ONLY)
BASOS ABS: 0.1 10*3/uL (ref 0.0–0.1)
BASOS PCT: 1 %
Eosinophils Absolute: 0 10*3/uL (ref 0.0–0.5)
Eosinophils Relative: 0 %
HEMATOCRIT: 37 % — AB (ref 38.4–49.9)
HEMOGLOBIN: 12.2 g/dL — AB (ref 13.0–17.1)
Lymphocytes Relative: 10 %
Lymphs Abs: 0.9 10*3/uL (ref 0.9–3.3)
MCH: 30 pg (ref 27.2–33.4)
MCHC: 33 g/dL (ref 32.0–36.0)
MCV: 91.1 fL (ref 79.3–98.0)
MONOS PCT: 6 %
Monocytes Absolute: 0.5 10*3/uL (ref 0.1–0.9)
NEUTROS ABS: 7.6 10*3/uL — AB (ref 1.5–6.5)
NEUTROS PCT: 83 %
Platelet Count: 285 10*3/uL (ref 140–400)
RBC: 4.07 MIL/uL — AB (ref 4.20–5.82)
RDW: 18.6 % — ABNORMAL HIGH (ref 11.0–14.6)
WBC Count: 9.1 10*3/uL (ref 4.0–10.3)

## 2017-11-17 LAB — CMP (CANCER CENTER ONLY)
ALK PHOS: 87 U/L (ref 40–150)
ALT: 20 U/L (ref 0–55)
ANION GAP: 9 (ref 3–11)
AST: 13 U/L (ref 5–34)
Albumin: 3.7 g/dL (ref 3.5–5.0)
BUN: 16 mg/dL (ref 7–26)
CO2: 25 mmol/L (ref 22–29)
Calcium: 9.1 mg/dL (ref 8.4–10.4)
Chloride: 106 mmol/L (ref 98–109)
Creatinine: 1.04 mg/dL (ref 0.70–1.30)
GFR, Estimated: >60 mL/min (ref >60)
Glucose, Bld: 115 mg/dL (ref 70–140)
Potassium: 3.8 mmol/L (ref 3.5–5.1)
SODIUM: 140 mmol/L (ref 136–145)
Total Bilirubin: 0.6 mg/dL (ref 0.2–1.2)
Total Protein: 6.6 g/dL (ref 6.4–8.3)

## 2017-11-17 MED ORDER — SODIUM CHLORIDE 0.9 % IV SOLN
Freq: Once | INTRAVENOUS | Status: AC
  Administered 2017-11-17: 09:00:00 via INTRAVENOUS

## 2017-11-17 MED ORDER — ALTEPLASE 2 MG IJ SOLR
2.0000 mg | Freq: Once | INTRAMUSCULAR | Status: DC | PRN
  Filled 2017-11-17: qty 2

## 2017-11-17 MED ORDER — PALONOSETRON HCL INJECTION 0.25 MG/5ML
INTRAVENOUS | Status: AC
  Filled 2017-11-17: qty 5

## 2017-11-17 MED ORDER — DOCETAXEL CHEMO INJECTION 160 MG/16ML
60.0000 mg/m2 | Freq: Once | INTRAVENOUS | Status: AC
  Administered 2017-11-17: 140 mg via INTRAVENOUS
  Filled 2017-11-17: qty 14

## 2017-11-17 MED ORDER — DEXAMETHASONE SODIUM PHOSPHATE 10 MG/ML IJ SOLN
INTRAMUSCULAR | Status: AC
  Filled 2017-11-17: qty 1

## 2017-11-17 MED ORDER — SODIUM CHLORIDE 0.9% FLUSH
10.0000 mL | INTRAVENOUS | Status: DC | PRN
  Administered 2017-11-17: 10 mL
  Filled 2017-11-17: qty 10

## 2017-11-17 MED ORDER — FAMOTIDINE IN NACL 20-0.9 MG/50ML-% IV SOLN
INTRAVENOUS | Status: AC
  Filled 2017-11-17: qty 50

## 2017-11-17 MED ORDER — DIPHENHYDRAMINE HCL 50 MG/ML IJ SOLN
INTRAMUSCULAR | Status: AC
  Filled 2017-11-17: qty 1

## 2017-11-17 MED ORDER — HEPARIN SOD (PORK) LOCK FLUSH 100 UNIT/ML IV SOLN
500.0000 [IU] | Freq: Once | INTRAVENOUS | Status: DC
  Filled 2017-11-17: qty 5

## 2017-11-17 MED ORDER — DEXAMETHASONE SODIUM PHOSPHATE 10 MG/ML IJ SOLN
10.0000 mg | Freq: Once | INTRAMUSCULAR | Status: AC
Start: 2017-11-17 — End: 2017-11-17
  Administered 2017-11-17: 10 mg via INTRAVENOUS

## 2017-11-17 MED ORDER — HEPARIN SOD (PORK) LOCK FLUSH 100 UNIT/ML IV SOLN
500.0000 [IU] | Freq: Once | INTRAVENOUS | Status: AC | PRN
  Administered 2017-11-17: 500 [IU]
  Filled 2017-11-17: qty 5

## 2017-11-17 MED ORDER — SODIUM CHLORIDE 0.9% FLUSH
10.0000 mL | INTRAVENOUS | Status: DC | PRN
  Administered 2017-11-17: 10 mL via INTRAVENOUS
  Filled 2017-11-17: qty 10

## 2017-11-17 NOTE — Patient Instructions (Signed)
[facility] Discharge Instructions for Patients Receiving Chemotherapy  Today you received the following chemotherapy agents: Taxotere  To help prevent nausea and vomiting after your treatment, we encourage you to take your nausea medication as directed.    If you develop nausea and vomiting that is not controlled by your nausea medication, call the clinic.   BELOW ARE SYMPTOMS THAT SHOULD BE REPORTED IMMEDIATELY:  *FEVER GREATER THAN 100.5 F  *CHILLS WITH OR WITHOUT FEVER  NAUSEA AND VOMITING THAT IS NOT CONTROLLED WITH YOUR NAUSEA MEDICATION  *UNUSUAL SHORTNESS OF BREATH  *UNUSUAL BRUISING OR BLEEDING  TENDERNESS IN MOUTH AND THROAT WITH OR WITHOUT PRESENCE OF ULCERS  *URINARY PROBLEMS  *BOWEL PROBLEMS  UNUSUAL RASH Items with * indicate a potential emergency and should be followed up as soon as possible.  Feel free to call the clinic should you have any questions or concerns. The clinic phone number is [phone].  Please show the CHEMO ALERT CARD at check-in to the Emergency Department and triage nurse.   

## 2017-11-17 NOTE — Telephone Encounter (Signed)
Printed avs and calender of upcoming appointment. Per 5/24 los 

## 2017-11-17 NOTE — Progress Notes (Signed)
Hematology and Oncology Follow Up Visit  [name] [id number] 09-23-1960 57 y.o. 11/17/2017 8:23 AM   Principle Diagnosis: 57 year old man with castration-resistant prostate cancer with initial diagnosis in 2012 with a Gleason score 4+5 = 9 and a PSA of 16.6.  He has documented metastatic disease to the bone.  Prior Therapy:  He underwent a radical prostatectomy on 04/18/2011 under the care of Dr. Alinda Money. The final pathological staging was T3b N1 with 9 positive lymph nodes out of 11 examined.  He continued to have persistent PSA and was treated with androgen deprivation. His PSA became undetectable until August 2014.    In June 2016 PSA went up to 2.0 and subsequently to 5.6 in November 2016. Staging workup including a bone scan done on 05/15/2015 which showed a focus of increased uptake in the posterior parietal occipital region of the skull.   Xtandi started on 06/10/2015.  Therapy discontinued in September 2018 because of progression of disease.  Zytiga 1000 mg daily with prednisone at 5 mg daily started in October 2018.  Therapy discontinued in January 2019 because of poor response.  Current therapy:   Androgen deprivation utilizing Lupron 30 mg every 4 months.  His next injection is due in June 2019.  Taxotere chemotherapy started on 08/04/2017. He received a 75 mg/m on cycle 1.  He is receiving subsequent cycles at 60 mg/m.  He is here for cycle 6 of therapy.  Interim History: [name] presents today for a follow-up.  Since her last visit, he reports no major changes in his health.  He tolerated the last cycle of chemotherapy without any complications.  He did report grade 1 arthralgias and myalgias that has resolved at this time.  He did report mild nausea but no vomiting.  His appetite remained about the same and his weight is unchanged.  He denies any worsening neuropathy or decline in his energy.  He does report mild fatigue that is manageable.  He denies any bone pain or  pathological fractures.  He does have intermittent rib cage pain that resolved spontaneously.  His quality of life remains reasonable at this time.   He does not report any blurry vision, syncope or seizures.  He denies alteration in mental status or confusion.  Does not report any fevers, chills, sweats. Does not report any chest pain, palpitation, orthopnea or leg edema. Does not report any cough, hemoptysis or wheezing.  Denies dyspnea on exertion.  Does not report any nausea, vomiting, abdominal pain. Does not report any hematochezia, melena.  He denies any constipation or diarrhea.  Does not report any hematuria or dysuria.  He denies any frequency or urgency.  He does not report any rash or ecchymosis.  He does not report any lymphadenopathy or petechiae.  He denies any changes in his mood.  Remaining review of systems is negative.  Medications: I have reviewed the patient's current medications.  Current Outpatient Medications  Medication Sig Dispense Refill  . calcium-vitamin D (CALCIUM 500+D) 500-200 MG-UNIT per tablet Take 2 tablets by mouth daily.    Marland Kitchen HYDROcodone-acetaminophen (NORCO) 5-325 MG tablet 1/2 to 1 Q 4 hours prn pain (Patient not taking: Reported on 10/27/2017) 30 tablet 0  . leuprolide (LUPRON) 22.5 MG injection Inject 22.5 mg into the muscle every 4 (four) months.     . lidocaine-prilocaine (EMLA) cream Apply 1 application topically as needed. (Patient taking differently: Apply 1 application topically as needed. ) 30 g 0  . losartan-hydrochlorothiazide (HYZAAR) 100-12.5 MG  tablet Take 1 tablet by mouth daily.    . predniSONE (DELTASONE) 5 MG tablet TAKE 1 TABLET BY MOUTH EVERY DAY WITH BREAKFAST 30 tablet 0  . prochlorperazine (COMPAZINE) 10 MG tablet Take 1 tablet (10 mg total) by mouth every 6 (six) hours as needed for nausea or vomiting. 30 tablet 0   No current facility-administered medications for this visit.    Facility-Administered Medications Ordered in Other Visits   Medication Dose Route Frequency Provider Last Rate Last Dose  . alteplase (CATHFLO ACTIVASE) injection 2 mg  2 mg Intracatheter Once PRN [name], MD      . heparin lock flush 100 unit/mL  500 Units Intravenous Once [name], MD      . sodium chloride flush (NS) 0.9 % injection 10 mL  10 mL Intravenous PRN [name], MD   10 mL at 11/17/17 0813     Allergies:  Allergies  Allergen Reactions  . Lanolin Itching and Rash    Past Medical History, Surgical history, Social history updated today without any changes.   Physical Exam:   Blood pressure 125/75, pulse 68, temperature 98.7 F (37.1 C), temperature source Oral, resp. rate 17, height 5\' 9"  (1.753 m), weight 257 lb 9.6 oz (116.8 kg), SpO2 100 %.   ECOG: 0 General appearance: Well-appearing gentleman without distress. Head: Normocephalic without abnormalities. Oropharynx: No oral thrush or ulcers. Eyes: Pupils are equal and round reactive to light. Lymph nodes: No lymphadenopathy noted in the cervical, axillary or supraclavicular lymph nodes. Heart: Regular rate without any murmurs or gallops. Lung: Clear that any rhonchi, wheezes or dullness to percussion. Abdomin: Soft, without any rebound or guarding.  No shifting dullness or ascites. Skin: No petechiae or ecchymosis. Musculoskeletal: No joint deformity or effusion.  Full range of motion noted. Neurological: No deficits noted motor and sensory exam.  Intact deep tendon reflexes.    Lab Results: Lab Results  Component Value Date   WBC 8.6 10/27/2017   HGB 13.2 10/27/2017   HCT 41.0 10/27/2017   MCV 91.5 10/27/2017   PLT 283 10/27/2017     Chemistry      Component Value Date/Time   NA 138 10/27/2017 0846   NA 144 06/08/2017 1423   K 4.1 10/27/2017 0846   K 3.4 (L) 06/08/2017 1423   CL 105 10/27/2017 0846   CO2 27 10/27/2017 0846   CO2 24 06/08/2017 1423   BUN 22 10/27/2017 0846   BUN 16.1 06/08/2017 1423   CREATININE 1.02 10/27/2017  0846   CREATININE 1.0 06/08/2017 1423      Component Value Date/Time   CALCIUM 9.7 10/27/2017 0846   CALCIUM 9.6 06/08/2017 1423   ALKPHOS 94 10/27/2017 0846   ALKPHOS 128 06/08/2017 1423   AST 11 10/27/2017 0846   AST 17 06/08/2017 1423   ALT 18 10/27/2017 0846   ALT 12 06/08/2017 1423   BILITOT 0.9 10/27/2017 0846   BILITOT 0.74 06/08/2017 1423           Impression and Plan:  57 year old man with:   1.  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  Foundation medicine testing on his prostate cancer original specimen did not show any targetable mutation.   He is currently receiving Taxotere chemotherapy without any major complications.  His disease has been palliated reasonably well although his PSA has not changed dramatically.  He does not report any bone pain or decline in his overall performance status.  Risks  and benefits of continuing Taxotere chemotherapy was reviewed today and is agreeable to continue.  Plan is to repeat imaging studies after 10 cycles of therapy.  The ramification of foundation medicine testing was discussed today.  There is no actionable mutation detected at this time.  Repeat the biopsy in the future may be considered if a bulky tumor is identified.    2. Androgen depravation: He continues on Lupron at 30 mg every 4 months.  He will receive Lupron in June 2019.     3. Bone directed therapy: He is currently on Xgeva without any major complications.  He will receive it every 6 weeks.  Osteonecrosis of the jaw and hypocalcemia were reviewed with the patient again today.  He is agreeable to continue.  4.  IV access: Port-A-Cath remains in place without complications.  5.  Neutropenia prophylaxis: No recent infections or hospitalizations.  I recommend continue growth factor support after each cycle of chemotherapy.   7.  Nausea prophylaxis: Nausea has been reasonably controlled with Compazine.  Prescription is available  to him.  8.  Goals of care: This was discussed today in detail again.  The goal of therapy is palliative although his performance status remain excellent and aggressive therapy is warranted.  9. Follow-up: Will be in 3 weeks to start the next cycle of chemotherapy.  25  minutes was spent with the patient face-to-face today.  More than 50% of time was dedicated to patient counseling, education and answering questions regarding diagnosis, prognosis and future plan of care.  Zola Button, MD 5/24/20198:23 AM

## 2017-11-18 ENCOUNTER — Inpatient Hospital Stay: Payer: 59

## 2017-11-18 VITALS — BP 148/86 | HR 74 | Temp 98.2°F | Resp 20

## 2017-11-18 DIAGNOSIS — C61 Malignant neoplasm of prostate: Secondary | ICD-10-CM

## 2017-11-18 DIAGNOSIS — Z5111 Encounter for antineoplastic chemotherapy: Secondary | ICD-10-CM | POA: Diagnosis not present

## 2017-11-18 LAB — PROSTATE-SPECIFIC AG, SERUM (LABCORP): Prostate Specific Ag, Serum: 89 ng/mL — ABNORMAL HIGH (ref 0.0–4.0)

## 2017-11-18 MED ORDER — PEGFILGRASTIM-CBQV 6 MG/0.6ML ~~LOC~~ SOSY
6.0000 mg | PREFILLED_SYRINGE | Freq: Once | SUBCUTANEOUS | Status: AC
  Administered 2017-11-18: 6 mg via SUBCUTANEOUS

## 2017-11-18 MED ORDER — PEGFILGRASTIM-CBQV 6 MG/0.6ML ~~LOC~~ SOSY
PREFILLED_SYRINGE | SUBCUTANEOUS | Status: AC
Start: 2017-11-18 — End: ?
  Filled 2017-11-18: qty 0.6

## 2017-11-18 NOTE — Patient Instructions (Signed)
Pegfilgrastim injection What is this medicine? PEGFILGRASTIM (PEG fil gra stim) is a long-acting granulocyte colony-stimulating factor that stimulates the growth of neutrophils, a type of white blood cell important in the body's fight against infection. It is used to reduce the incidence of fever and infection in patients with certain types of cancer who are receiving chemotherapy that affects the bone marrow, and to increase survival after being exposed to high doses of radiation. This medicine may be used for other purposes; ask your health care provider or pharmacist if you have questions. COMMON BRAND NAME(S): Neulasta What should I tell my health care provider before I take this medicine? They need to know if you have any of these conditions: -kidney disease -latex allergy -ongoing radiation therapy -sickle cell disease -skin reactions to acrylic adhesives (On-Body Injector only) -an unusual or allergic reaction to pegfilgrastim, filgrastim, other medicines, foods, dyes, or preservatives -pregnant or trying to get pregnant -breast-feeding How should I use this medicine? This medicine is for injection under the skin. If you get this medicine at home, you will be taught how to prepare and give the pre-filled syringe or how to use the On-body Injector. Refer to the patient Instructions for Use for detailed instructions. Use exactly as directed. Tell your healthcare provider immediately if you suspect that the On-body Injector may not have performed as intended or if you suspect the use of the On-body Injector resulted in a missed or partial dose. It is important that you put your used needles and syringes in a special sharps container. Do not put them in a trash can. If you do not have a sharps container, call your pharmacist or healthcare provider to get one. Talk to your pediatrician regarding the use of this medicine in children. While this drug may be prescribed for selected conditions,  precautions do apply. Overdosage: If you think you have taken too much of this medicine contact a poison control center or emergency room at once. NOTE: This medicine is only for you. Do not share this medicine with others. What if I miss a dose? It is important not to miss your dose. Call your doctor or health care professional if you miss your dose. If you miss a dose due to an On-body Injector failure or leakage, a new dose should be administered as soon as possible using a single prefilled syringe for manual use. What may interact with this medicine? Interactions have not been studied. Give your health care provider a list of all the medicines, herbs, non-prescription drugs, or dietary supplements you use. Also tell them if you smoke, drink alcohol, or use illegal drugs. Some items may interact with your medicine. This list may not describe all possible interactions. Give your health care provider a list of all the medicines, herbs, non-prescription drugs, or dietary supplements you use. Also tell them if you smoke, drink alcohol, or use illegal drugs. Some items may interact with your medicine. What should I watch for while using this medicine? You may need blood work done while you are taking this medicine. If you are going to need a MRI, CT scan, or other procedure, tell your doctor that you are using this medicine (On-Body Injector only). What side effects may I notice from receiving this medicine? Side effects that you should report to your doctor or health care professional as soon as possible: -allergic reactions like skin rash, itching or hives, swelling of the face, lips, or tongue -dizziness -fever -pain, redness, or irritation at site   where injected -pinpoint red spots on the skin -red or dark-brown urine -shortness of breath or breathing problems -stomach or side pain, or pain at the shoulder -swelling -tiredness -trouble passing urine or change in the amount of urine Side  effects that usually do not require medical attention (report to your doctor or health care professional if they continue or are bothersome): -bone pain -muscle pain This list may not describe all possible side effects. Call your doctor for medical advice about side effects. You may report side effects to FDA at 1-800-FDA-1088. Where should I keep my medicine? Keep out of the reach of children. Store pre-filled syringes in a refrigerator between 2 and 8 degrees C (36 and 46 degrees F). Do not freeze. Keep in carton to protect from light. Throw away this medicine if it is left out of the refrigerator for more than 48 hours. Throw away any unused medicine after the expiration date. NOTE: This sheet is a summary. It may not cover all possible information. If you have questions about this medicine, talk to your doctor, pharmacist, or health care provider.  2018 [employer]vier/Gold Standard (2016-06-09 12:58:03)  

## 2017-11-21 ENCOUNTER — Telehealth: Payer: Self-pay | Admitting: *Deleted

## 2017-11-21 NOTE — Telephone Encounter (Signed)
As noted below by Dr. Alen Blew, I informed wife of PSA level. She verbalized understanding.

## 2017-11-21 NOTE — Telephone Encounter (Signed)
-----   Message from [name], MD sent at 11/20/2017  9:18 AM EDT ----- Please let him know his PSA is slightly up. No change for now.

## 2017-11-25 ENCOUNTER — Other Ambulatory Visit: Payer: Self-pay | Admitting: Oncology

## 2017-11-25 DIAGNOSIS — C61 Malignant neoplasm of prostate: Secondary | ICD-10-CM

## 2017-12-08 ENCOUNTER — Inpatient Hospital Stay: Payer: 59

## 2017-12-08 ENCOUNTER — Telehealth: Payer: Self-pay | Admitting: Oncology

## 2017-12-08 ENCOUNTER — Inpatient Hospital Stay (HOSPITAL_BASED_OUTPATIENT_CLINIC_OR_DEPARTMENT_OTHER): Payer: 59 | Admitting: Oncology

## 2017-12-08 ENCOUNTER — Inpatient Hospital Stay: Payer: 59 | Attending: Oncology

## 2017-12-08 VITALS — BP 127/79 | HR 74 | Temp 98.4°F | Resp 18 | Ht 69.0 in | Wt 258.1 lb

## 2017-12-08 DIAGNOSIS — C61 Malignant neoplasm of prostate: Secondary | ICD-10-CM | POA: Diagnosis not present

## 2017-12-08 DIAGNOSIS — Z95828 Presence of other vascular implants and grafts: Secondary | ICD-10-CM

## 2017-12-08 DIAGNOSIS — Z79899 Other long term (current) drug therapy: Secondary | ICD-10-CM

## 2017-12-08 DIAGNOSIS — Z5111 Encounter for antineoplastic chemotherapy: Secondary | ICD-10-CM | POA: Insufficient documentation

## 2017-12-08 LAB — CBC WITH DIFFERENTIAL (CANCER CENTER ONLY)
Basophils Absolute: 0 10*3/uL (ref 0.0–0.1)
Basophils Relative: 0 %
EOS ABS: 0 10*3/uL (ref 0.0–0.5)
EOS PCT: 0 %
HCT: 39.1 % (ref 38.4–49.9)
HEMOGLOBIN: 12.7 g/dL — AB (ref 13.0–17.1)
LYMPHS ABS: 1 10*3/uL (ref 0.9–3.3)
LYMPHS PCT: 9 %
MCH: 30.5 pg (ref 27.2–33.4)
MCHC: 32.5 g/dL (ref 32.0–36.0)
MCV: 93.8 fL (ref 79.3–98.0)
MONOS PCT: 7 %
Monocytes Absolute: 0.8 10*3/uL (ref 0.1–0.9)
NEUTROS PCT: 84 %
Neutro Abs: 9.4 10*3/uL — ABNORMAL HIGH (ref 1.5–6.5)
Platelet Count: 273 10*3/uL (ref 140–400)
RBC: 4.17 MIL/uL — ABNORMAL LOW (ref 4.20–5.82)
RDW: 15.6 % — ABNORMAL HIGH (ref 11.0–14.6)
WBC Count: 11.2 10*3/uL — ABNORMAL HIGH (ref 4.0–10.3)

## 2017-12-08 LAB — CMP (CANCER CENTER ONLY)
ALK PHOS: 74 U/L (ref 40–150)
ALT: 19 U/L (ref 0–55)
ANION GAP: 9 (ref 3–11)
AST: 11 U/L (ref 5–34)
Albumin: 3.9 g/dL (ref 3.5–5.0)
BILIRUBIN TOTAL: 0.5 mg/dL (ref 0.2–1.2)
BUN: 17 mg/dL (ref 7–26)
CALCIUM: 9.4 mg/dL (ref 8.4–10.4)
CO2: 27 mmol/L (ref 22–29)
CREATININE: 1.14 mg/dL (ref 0.70–1.30)
Chloride: 106 mmol/L (ref 98–109)
Glucose, Bld: 112 mg/dL (ref 70–140)
Potassium: 3.8 mmol/L (ref 3.5–5.1)
SODIUM: 142 mmol/L (ref 136–145)
TOTAL PROTEIN: 6.7 g/dL (ref 6.4–8.3)

## 2017-12-08 MED ORDER — SODIUM CHLORIDE 0.9% FLUSH
10.0000 mL | INTRAVENOUS | Status: DC | PRN
  Administered 2017-12-08: 10 mL
  Filled 2017-12-08: qty 10

## 2017-12-08 MED ORDER — SODIUM CHLORIDE 0.9 % IV SOLN
Freq: Once | INTRAVENOUS | Status: AC
  Administered 2017-12-08: 13:00:00 via INTRAVENOUS

## 2017-12-08 MED ORDER — HEPARIN SOD (PORK) LOCK FLUSH 100 UNIT/ML IV SOLN
500.0000 [IU] | Freq: Once | INTRAVENOUS | Status: AC | PRN
Start: 2017-12-08 — End: 2017-12-08
  Administered 2017-12-08: 500 [IU]
  Filled 2017-12-08: qty 5

## 2017-12-08 MED ORDER — DENOSUMAB 120 MG/1.7ML ~~LOC~~ SOLN
120.0000 mg | Freq: Once | SUBCUTANEOUS | Status: AC
  Administered 2017-12-08: 120 mg via SUBCUTANEOUS

## 2017-12-08 MED ORDER — SODIUM CHLORIDE 0.9% FLUSH
10.0000 mL | INTRAVENOUS | Status: DC | PRN
  Administered 2017-12-08: 10 mL via INTRAVENOUS
  Filled 2017-12-08: qty 10

## 2017-12-08 MED ORDER — SODIUM CHLORIDE 0.9 % IV SOLN
60.0000 mg/m2 | Freq: Once | INTRAVENOUS | Status: AC
  Administered 2017-12-08: 140 mg via INTRAVENOUS
  Filled 2017-12-08: qty 14

## 2017-12-08 MED ORDER — LEUPROLIDE ACETATE (4 MONTH) 30 MG IM KIT
30.0000 mg | PACK | Freq: Once | INTRAMUSCULAR | Status: AC
  Administered 2017-12-08: 30 mg via INTRAMUSCULAR
  Filled 2017-12-08: qty 30

## 2017-12-08 MED ORDER — DEXAMETHASONE SODIUM PHOSPHATE 10 MG/ML IJ SOLN
10.0000 mg | Freq: Once | INTRAMUSCULAR | Status: AC
  Administered 2017-12-08: 10 mg via INTRAVENOUS

## 2017-12-08 MED ORDER — DEXAMETHASONE SODIUM PHOSPHATE 10 MG/ML IJ SOLN
INTRAMUSCULAR | Status: AC
  Filled 2017-12-08: qty 1

## 2017-12-08 MED ORDER — DENOSUMAB 120 MG/1.7ML ~~LOC~~ SOLN
SUBCUTANEOUS | Status: AC
  Filled 2017-12-08: qty 1.7

## 2017-12-08 NOTE — Patient Instructions (Signed)
[facility] Discharge Instructions for Patients Receiving Chemotherapy  Today you received the following chemotherapy agents Taxotere.  To help prevent nausea and vomiting after your treatment, we encourage you to take your nausea medication as directed.  If you develop nausea and vomiting that is not controlled by your nausea medication, call the clinic.   BELOW ARE SYMPTOMS THAT SHOULD BE REPORTED IMMEDIATELY:  *FEVER GREATER THAN 100.5 F  *CHILLS WITH OR WITHOUT FEVER  NAUSEA AND VOMITING THAT IS NOT CONTROLLED WITH YOUR NAUSEA MEDICATION  *UNUSUAL SHORTNESS OF BREATH  *UNUSUAL BRUISING OR BLEEDING  TENDERNESS IN MOUTH AND THROAT WITH OR WITHOUT PRESENCE OF ULCERS  *URINARY PROBLEMS  *BOWEL PROBLEMS  UNUSUAL RASH Items with * indicate a potential emergency and should be followed up as soon as possible.  Feel free to call the clinic should you have any questions or concerns. The clinic phone number is [phone].  Please show the [place] at check-in to the Emergency Department and triage nurse.  Leuprolide injection What is this medicine? LEUPROLIDE (loo PROE lide) is a man-made hormone. It is used to treat the symptoms of prostate cancer. This medicine may also be used to treat children with early onset of puberty. It may be used for other hormonal conditions. This medicine may be used for other purposes; ask your health care provider or pharmacist if you have questions. COMMON BRAND NAME(S): Lupron What should I tell my health care provider before I take this medicine? They need to know if you have any of these conditions: -diabetes -heart disease or previous heart attack -high blood pressure -high cholesterol -pain or difficulty passing urine -spinal cord metastasis -stroke -tobacco smoker -an unusual or allergic reaction to leuprolide, benzyl alcohol, other medicines, foods, dyes, or preservatives -pregnant or trying to get  pregnant -breast-feeding How should I use this medicine? This medicine is for injection under the skin or into a muscle. You will be taught how to prepare and give this medicine. Use exactly as directed. Take your medicine at regular intervals. Do not take your medicine more often than directed. It is important that you put your used needles and syringes in a special sharps container. Do not put them in a trash can. If you do not have a sharps container, call your pharmacist or healthcare provider to get one. A special MedGuide will be given to you by the pharmacist with each prescription and refill. Be sure to read this information carefully each time. Talk to your pediatrician regarding the use of this medicine in children. While this medicine may be prescribed for children as young as 8 years for selected conditions, precautions do apply. Overdosage: If you think you have taken too much of this medicine contact a poison control center or emergency room at once. NOTE: This medicine is only for you. Do not share this medicine with others. What if I miss a dose? If you miss a dose, take it as soon as you can. If it is almost time for your next dose, take only that dose. Do not take double or extra doses. What may interact with this medicine? Do not take this medicine with any of the following medications: -chasteberry This medicine may also interact with the following medications: -herbal or dietary supplements, like black cohosh or DHEA -male hormones, like estrogens or progestins and birth control pills, patches, rings, or injections -male hormones, like testosterone This list may not describe all possible interactions. Give your health care  provider a list of all the medicines, herbs, non-prescription drugs, or dietary supplements you use. Also tell them if you smoke, drink alcohol, or use illegal drugs. Some items may interact with your medicine. What should I watch for while using this  medicine? Visit your doctor or health care professional for regular checks on your progress. During the first week, your symptoms may get worse, but then will improve as you continue your treatment. You may get hot flashes, increased bone pain, increased difficulty passing urine, or an aggravation of nerve symptoms. Discuss these effects with your doctor or health care professional, some of them may improve with continued use of this medicine. Male patients may experience a menstrual cycle or spotting during the first 2 months of therapy with this medicine. If this continues, contact your doctor or health care professional. What side effects may I notice from receiving this medicine? Side effects that you should report to your doctor or health care professional as soon as possible: -allergic reactions like skin rash, itching or hives, swelling of the face, lips, or tongue -breathing problems -chest pain -depression or memory disorders -pain in your legs or groin -pain at site where injected -severe headache -swelling of the feet and legs -visual changes -vomiting Side effects that usually do not require medical attention (report to your doctor or health care professional if they continue or are bothersome): -breast swelling or tenderness -decrease in sex drive or performance -diarrhea -hot flashes -loss of appetite -muscle, joint, or bone pains -nausea -redness or irritation at site where injected -skin problems or acne This list may not describe all possible side effects. Call your doctor for medical advice about side effects. You may report side effects to FDA at 1-800-FDA-1088. Where should I keep my medicine? Keep out of the reach of children. Store below 25 degrees C (77 degrees F). Do not freeze. Protect from light. Do not use if it is not clear or if there are particles present. Throw away any unused medicine after the expiration date. NOTE: This sheet is a summary. It may not  cover all possible information. If you have questions about this medicine, talk to your doctor, pharmacist, or health care provider.  2018 Elsevier/Gold Standard (2016-02-01 10:54:35) Denosumab injection What is this medicine? DENOSUMAB (den oh sue mab) slows bone breakdown. Prolia is used to treat osteoporosis in women after menopause and in men. Delton See is used to treat a high calcium level due to cancer and to prevent bone fractures and other bone problems caused by multiple myeloma or cancer bone metastases. Delton See is also used to treat giant cell tumor of the bone. This medicine may be used for other purposes; ask your health care provider or pharmacist if you have questions. COMMON BRAND NAME(S): Prolia, XGEVA What should I tell my health care provider before I take this medicine? They need to know if you have any of these conditions: -dental disease -having surgery or tooth extraction -infection -kidney disease -low levels of calcium or Vitamin D in the blood -malnutrition -on hemodialysis -skin conditions or sensitivity -thyroid or parathyroid disease -an unusual reaction to denosumab, other medicines, foods, dyes, or preservatives -pregnant or trying to get pregnant -breast-feeding How should I use this medicine? This medicine is for injection under the skin. It is given by a health care professional in a hospital or clinic setting. If you are getting Prolia, a special MedGuide will be given to you by the pharmacist with each prescription and refill. Be sure  to read this information carefully each time. For Prolia, talk to your pediatrician regarding the use of this medicine in children. Special care may be needed. For Delton See, talk to your pediatrician regarding the use of this medicine in children. While this drug may be prescribed for children as young as 13 years for selected conditions, precautions do apply. Overdosage: If you think you have taken too much of this medicine contact  a poison control center or emergency room at once. NOTE: This medicine is only for you. Do not share this medicine with others. What if I miss a dose? It is important not to miss your dose. Call your doctor or health care professional if you are unable to keep an appointment. What may interact with this medicine? Do not take this medicine with any of the following medications: -other medicines containing denosumab This medicine may also interact with the following medications: -medicines that lower your chance of fighting infection -steroid medicines like prednisone or cortisone This list may not describe all possible interactions. Give your health care provider a list of all the medicines, herbs, non-prescription drugs, or dietary supplements you use. Also tell them if you smoke, drink alcohol, or use illegal drugs. Some items may interact with your medicine. What should I watch for while using this medicine? Visit your doctor or health care professional for regular checks on your progress. Your doctor or health care professional may order blood tests and other tests to see how you are doing. Call your doctor or health care professional for advice if you get a fever, chills or sore throat, or other symptoms of a cold or flu. Do not treat yourself. This drug may decrease your body's ability to fight infection. Try to avoid being around people who are sick. You should make sure you get enough calcium and vitamin D while you are taking this medicine, unless your doctor tells you not to. Discuss the foods you eat and the vitamins you take with your health care professional. See your dentist regularly. Brush and floss your teeth as directed. Before you have any dental work done, tell your dentist you are receiving this medicine. Do not become pregnant while taking this medicine or for 5 months after stopping it. Talk with your doctor or health care professional about your birth control options while  taking this medicine. Women should inform their doctor if they wish to become pregnant or think they might be pregnant. There is a potential for serious side effects to an unborn child. Talk to your health care professional or pharmacist for more information. What side effects may I notice from receiving this medicine? Side effects that you should report to your doctor or health care professional as soon as possible: -allergic reactions like skin rash, itching or hives, swelling of the face, lips, or tongue -bone pain -breathing problems -dizziness -jaw pain, especially after dental work -redness, blistering, peeling of the skin -signs and symptoms of infection like fever or chills; cough; sore throat; pain or trouble passing urine -signs of low calcium like fast heartbeat, muscle cramps or muscle pain; pain, tingling, numbness in the hands or feet; seizures -unusual bleeding or bruising -unusually weak or tired Side effects that usually do not require medical attention (report to your doctor or health care professional if they continue or are bothersome): -constipation -diarrhea -headache -joint pain -loss of appetite -muscle pain -runny nose -tiredness -upset stomach This list may not describe all possible side effects. Call your doctor for medical  advice about side effects. You may report side effects to FDA at 1-800-FDA-1088. Where should I keep my medicine? This medicine is only given in a clinic, doctor's office, or other health care setting and will not be stored at home. NOTE: This sheet is a summary. It may not cover all possible information. If you have questions about this medicine, talk to your doctor, pharmacist, or health care provider.  2018 Elsevier/Gold Standard (2016-07-05 19:17:21)

## 2017-12-08 NOTE — Telephone Encounter (Signed)
Appointments scheduled AVS/Calendar printed per 6/14 los °

## 2017-12-08 NOTE — Addendum Note (Signed)
Addended by: [name] on: 12/08/2017 01:03 PM   Modules accepted: Orders

## 2017-12-08 NOTE — Progress Notes (Signed)
Hematology and Oncology Follow Up Visit  [name] [id number] 1961/01/17 57 y.o. 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  His initial diagnosis in 2012 with a Gleason score 4+5 = 9 and a PSA of 16.6.   Prior Therapy:  He is S/P radical prostatectomy on 04/18/2011 under the care of Dr. Alinda Money. The final pathological staging was T3b N1 with 9 positive lymph nodes out of 11 examined.   He continued to have persistent PSA and was treated with androgen deprivation. His PSA became undetectable until August 2014.    In June 2016 PSA went up to 2.0 and subsequently to 5.6 in November 2016. Staging workup including a bone scan done on 05/15/2015 which showed a focus of increased uptake in the posterior parietal occipital region of the skull.   Xtandi started on 06/10/2015.  Therapy discontinued in September 2018 because of progression of disease.  Zytiga 1000 mg daily with prednisone at 5 mg daily started in October 2018.  Therapy discontinued in January 2019 because of poor response.  Current therapy:   Androgen deprivation utilizing Lupron 30 mg every 4 months.    Xgeva given every 6 weeks.  Taxotere chemotherapy started on 08/04/2017. He received a 75 mg/m on cycle 1.  He is receiving subsequent cycles at 60 mg/m.  He is here for cycle 7 of therapy of potentially 10 cycles.  Interim History: [name] is here for a follow-up.  Since the last visit, he tolerated the last cycle of chemotherapy without any major complications.  He denies any nausea, vomiting or excessive fatigue.  He denies any infusion related complications or worsening neuropathy.  He does report very mild arthralgias with certain exertion.  He does report rib cage pain with lifting heavy objects.  He is able to ambulate without any falls or syncope.  His appetite although slightly down he is able to eat and have  actually gained weight.  He denies any urination difficulties although he does report nocturia at times.  His quality of life and performance status remained maintained.  He does not report any blurry vision, syncope or seizures.  He denies neuropathy or mood changes.  Does not report any fevers, chills, sweats.  His weight has increased.  Does not report any chest pain, palpitation, orthopnea or leg edema. Does not report any cough, hemoptysis or wheezing.  Does not report any shortness of breath.  Does not report any nausea, vomiting, abdominal pain.  He denies any changes in his bowel habits.  Does not report any hematochezia, melena.  Does not report any hematuria or dysuria.  He does not report any rash or petechiae.  He does not report any lymphadenopathy or easy bruising.  He denies any heat or cold intolerance.  Remaining review of systems is negative.  Medications: I have reviewed the patient's current medications.  Current Outpatient Medications  Medication Sig Dispense Refill  . calcium-vitamin D (CALCIUM 500+D) 500-200 MG-UNIT per tablet Take 2 tablets by mouth daily.    Marland Kitchen HYDROcodone-acetaminophen (NORCO) 5-325 MG tablet 1/2 to 1 Q 4 hours prn pain 30 tablet 0  . leuprolide (LUPRON) 22.5 MG injection Inject 22.5 mg into the muscle every 4 (four) months.     . lidocaine-prilocaine (EMLA) cream Apply 1 application topically as needed. (Patient taking differently: Apply 1 application topically as needed. ) 30 g 0  . losartan-hydrochlorothiazide (HYZAAR) 100-12.5 MG tablet Take  1 tablet by mouth daily.    . predniSONE (DELTASONE) 5 MG tablet TAKE 1 TABLET BY MOUTH EVERY DAY WITH BREAKFAST 30 tablet 0  . prochlorperazine (COMPAZINE) 10 MG tablet Take 1 tablet (10 mg total) by mouth every 6 (six) hours as needed for nausea or vomiting. 30 tablet 0   No current facility-administered medications for this visit.      Allergies:  Allergies  Allergen Reactions  . Lanolin Itching and Rash     Past Medical History, Surgical history, Social history updated today without any changes.   Physical Exam:   Blood pressure 127/79, pulse 74, temperature 98.4 F (36.9 C), temperature source Oral, resp. rate 18, height 5\' 9"  (1.753 m), weight 258 lb 1.6 oz (117.1 kg), SpO2 100 %.   ECOG: 0 General appearance: Alert, awake gentleman without distress. Head: Normocephalic without abnormalities. Oropharynx: Mucous membranes are moist and pink. Eyes: Sclera anicteric.   Lymph nodes: No cervical, axillary, inguinal or supraclavicular l adenopathy. Heart: Regular rate without any murmurs or gallops. Lung: Clear in all lung fields without any wheezes or dullness to percussion. Abdomin: Soft, nontender, nondistended with good bowel sounds.  No rebound or guarding. Skin: No ecchymosis or petechiae. Musculoskeletal: No joint deformity or effusion. Neurological: No motor or sensory deficits.    Lab Results: Lab Results  Component Value Date   WBC 11.2 (H) 12/08/2017   HGB 12.7 (L) 12/08/2017   HCT 39.1 12/08/2017   MCV 93.8 12/08/2017   PLT 273 12/08/2017     Chemistry      Component Value Date/Time   NA 140 11/17/2017 0832   NA 144 06/08/2017 1423   K 3.8 11/17/2017 0832   K 3.4 (L) 06/08/2017 1423   CL 106 11/17/2017 0832   CO2 25 11/17/2017 0832   CO2 24 06/08/2017 1423   BUN 16 11/17/2017 0832   BUN 16.1 06/08/2017 1423   CREATININE 1.04 11/17/2017 0832   CREATININE 1.0 06/08/2017 1423      Component Value Date/Time   CALCIUM 9.1 11/17/2017 0832   CALCIUM 9.6 06/08/2017 1423   ALKPHOS 87 11/17/2017 0832   ALKPHOS 128 06/08/2017 1423   AST 13 11/17/2017 0832   AST 17 06/08/2017 1423   ALT 20 11/17/2017 0832   ALT 12 06/08/2017 1423   BILITOT 0.6 11/17/2017 0832   BILITOT 0.74 06/08/2017 1423           Impression and Plan:  57 year old man with:   1.  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  He has progressed on multiple therapies outlined above.   He continues to receive Taxotere chemotherapy that has been overall tolerable at the current dose and schedule.  He does not report any complications or need for a dose reduction or delay.  Risks and benefits of continuing his chemotherapy as well as the rationale for alternative therapy was reviewed today.  His PSA has remained overall relatively stable with less than adequate response overall.  Alternative therapy would include switching him to [facility] chemotherapy.  After discussion today, we have elected to continue with the same dose and schedule of Taxotere and he will receive cycle 8 on December 29, 2017.  Further chemotherapy will be assessed after that.    2. Androgen depravation: Risks and benefits of continuing this therapy was reviewed today.  I will receive Lupron on June 17 and repeated in 4 months.   3. Bone directed therapy: No complications  related to Xgeva at this time.  Long-term complication associated with this medication include osteonecrosis of the jaw and hypocalcemia were reviewed today with the patient.  He is agreeable to continue.  4.  IV access: Port-A-Cath has been used effectively without complications.  5.  Neutropenia prophylaxis: Will receive growth factor support after each cycle of chemotherapy.  7.  Nausea prophylaxis: No issues reported with nausea and vomiting.  He has adequate antiemetics.  8.  Goals of care: Therapy remains palliative at this time.  His performance status remains excellent and aggressive therapy is warranted.  9. Follow-up: Will be in 3 weeks for cycle 8 of chemotherapy and in 6 weeks for cycle 9.  25  minutes was spent with the patient face-to-face today.  More than 50% of time was dedicated to patient counseling, education and coordinating his care including alternative future treatment options.  Zola Button, MD 6/14/201911:48 AM

## 2017-12-09 LAB — PROSTATE-SPECIFIC AG, SERUM (LABCORP): PROSTATE SPECIFIC AG, SERUM: 83.4 ng/mL — AB (ref 0.0–4.0)

## 2017-12-11 ENCOUNTER — Inpatient Hospital Stay: Payer: 59

## 2017-12-11 ENCOUNTER — Telehealth: Payer: Self-pay | Admitting: *Deleted

## 2017-12-11 DIAGNOSIS — Z5111 Encounter for antineoplastic chemotherapy: Secondary | ICD-10-CM | POA: Diagnosis not present

## 2017-12-11 DIAGNOSIS — C61 Malignant neoplasm of prostate: Secondary | ICD-10-CM

## 2017-12-11 MED ORDER — PEGFILGRASTIM-CBQV 6 MG/0.6ML ~~LOC~~ SOSY
PREFILLED_SYRINGE | SUBCUTANEOUS | Status: AC
  Filled 2017-12-11: qty 0.6

## 2017-12-11 MED ORDER — PEGFILGRASTIM-CBQV 6 MG/0.6ML ~~LOC~~ SOSY
6.0000 mg | PREFILLED_SYRINGE | Freq: Once | SUBCUTANEOUS | Status: AC
  Administered 2017-12-11: 6 mg via SUBCUTANEOUS

## 2017-12-11 NOTE — Telephone Encounter (Signed)
As noted below by Dr. Alen Blew, I informed the wife of the PSA level. She verbalized understanding.

## 2017-12-11 NOTE — Telephone Encounter (Signed)
-----   Message from [name], MD sent at 12/11/2017  9:05 AM EDT ----- Please let him know his PSA went down.

## 2017-12-11 NOTE — Patient Instructions (Signed)
Pegfilgrastim injection What is this medicine? PEGFILGRASTIM (PEG fil gra stim) is a long-acting granulocyte colony-stimulating factor that stimulates the growth of neutrophils, a type of white blood cell important in the body's fight against infection. It is used to reduce the incidence of fever and infection in patients with certain types of cancer who are receiving chemotherapy that affects the bone marrow, and to increase survival after being exposed to high doses of radiation. This medicine may be used for other purposes; ask your health care provider or pharmacist if you have questions. COMMON BRAND NAME(S): Neulasta What should I tell my health care provider before I take this medicine? They need to know if you have any of these conditions: -kidney disease -latex allergy -ongoing radiation therapy -sickle cell disease -skin reactions to acrylic adhesives (On-Body Injector only) -an unusual or allergic reaction to pegfilgrastim, filgrastim, other medicines, foods, dyes, or preservatives -pregnant or trying to get pregnant -breast-feeding How should I use this medicine? This medicine is for injection under the skin. If you get this medicine at home, you will be taught how to prepare and give the pre-filled syringe or how to use the On-body Injector. Refer to the patient Instructions for Use for detailed instructions. Use exactly as directed. Tell your healthcare provider immediately if you suspect that the On-body Injector may not have performed as intended or if you suspect the use of the On-body Injector resulted in a missed or partial dose. It is important that you put your used needles and syringes in a special sharps container. Do not put them in a trash can. If you do not have a sharps container, call your pharmacist or healthcare provider to get one. Talk to your pediatrician regarding the use of this medicine in children. While this drug may be prescribed for selected conditions,  precautions do apply. Overdosage: If you think you have taken too much of this medicine contact a poison control center or emergency room at once. NOTE: This medicine is only for you. Do not share this medicine with others. What if I miss a dose? It is important not to miss your dose. Call your doctor or health care professional if you miss your dose. If you miss a dose due to an On-body Injector failure or leakage, a new dose should be administered as soon as possible using a single prefilled syringe for manual use. What may interact with this medicine? Interactions have not been studied. Give your health care provider a list of all the medicines, herbs, non-prescription drugs, or dietary supplements you use. Also tell them if you smoke, drink alcohol, or use illegal drugs. Some items may interact with your medicine. This list may not describe all possible interactions. Give your health care provider a list of all the medicines, herbs, non-prescription drugs, or dietary supplements you use. Also tell them if you smoke, drink alcohol, or use illegal drugs. Some items may interact with your medicine. What should I watch for while using this medicine? You may need blood work done while you are taking this medicine. If you are going to need a MRI, CT scan, or other procedure, tell your doctor that you are using this medicine (On-Body Injector only). What side effects may I notice from receiving this medicine? Side effects that you should report to your doctor or health care professional as soon as possible: -allergic reactions like skin rash, itching or hives, swelling of the face, lips, or tongue -dizziness -fever -pain, redness, or irritation at site   where injected -pinpoint red spots on the skin -red or dark-brown urine -shortness of breath or breathing problems -stomach or side pain, or pain at the shoulder -swelling -tiredness -trouble passing urine or change in the amount of urine Side  effects that usually do not require medical attention (report to your doctor or health care professional if they continue or are bothersome): -bone pain -muscle pain This list may not describe all possible side effects. Call your doctor for medical advice about side effects. You may report side effects to FDA at 1-800-FDA-1088. Where should I keep my medicine? Keep out of the reach of children. Store pre-filled syringes in a refrigerator between 2 and 8 degrees C (36 and 46 degrees F). Do not freeze. Keep in carton to protect from light. Throw away this medicine if it is left out of the refrigerator for more than 48 hours. Throw away any unused medicine after the expiration date. NOTE: This sheet is a summary. It may not cover all possible information. If you have questions about this medicine, talk to your doctor, pharmacist, or health care provider.  2018 [employer]vier/Gold Standard (2016-06-09 12:58:03)  

## 2017-12-29 ENCOUNTER — Inpatient Hospital Stay: Payer: 59

## 2017-12-29 ENCOUNTER — Other Ambulatory Visit: Payer: Self-pay | Admitting: Oncology

## 2017-12-29 ENCOUNTER — Inpatient Hospital Stay: Payer: 59 | Attending: Oncology | Admitting: Oncology

## 2017-12-29 ENCOUNTER — Encounter: Payer: Self-pay | Admitting: Oncology

## 2017-12-29 VITALS — BP 132/79 | HR 73 | Temp 99.0°F | Resp 18 | Ht 69.0 in | Wt 258.1 lb

## 2017-12-29 DIAGNOSIS — Z95828 Presence of other vascular implants and grafts: Secondary | ICD-10-CM

## 2017-12-29 DIAGNOSIS — C61 Malignant neoplasm of prostate: Secondary | ICD-10-CM | POA: Diagnosis not present

## 2017-12-29 DIAGNOSIS — Z5111 Encounter for antineoplastic chemotherapy: Secondary | ICD-10-CM | POA: Insufficient documentation

## 2017-12-29 DIAGNOSIS — Z79899 Other long term (current) drug therapy: Secondary | ICD-10-CM | POA: Insufficient documentation

## 2017-12-29 LAB — CBC WITH DIFFERENTIAL (CANCER CENTER ONLY)
BASOS PCT: 1 %
Basophils Absolute: 0.1 10*3/uL (ref 0.0–0.1)
EOS ABS: 0 10*3/uL (ref 0.0–0.5)
EOS PCT: 0 %
HCT: 38.5 % (ref 38.4–49.9)
Hemoglobin: 13 g/dL (ref 13.0–17.1)
LYMPHS ABS: 1.1 10*3/uL (ref 0.9–3.3)
Lymphocytes Relative: 13 %
MCH: 31 pg (ref 27.2–33.4)
MCHC: 33.7 g/dL (ref 32.0–36.0)
MCV: 92.2 fL (ref 79.3–98.0)
MONO ABS: 0.7 10*3/uL (ref 0.1–0.9)
MONOS PCT: 8 %
Neutro Abs: 6.5 10*3/uL (ref 1.5–6.5)
Neutrophils Relative %: 78 %
Platelet Count: 294 10*3/uL (ref 140–400)
RBC: 4.17 MIL/uL — ABNORMAL LOW (ref 4.20–5.82)
RDW: 15.8 % — AB (ref 11.0–14.6)
WBC Count: 8.5 10*3/uL (ref 4.0–10.3)

## 2017-12-29 LAB — COMPREHENSIVE METABOLIC PANEL
ALK PHOS: 58 U/L (ref 38–126)
ALT: 28 U/L (ref 0–44)
ANION GAP: 10 (ref 5–15)
AST: 18 U/L (ref 15–41)
Albumin: 3.8 g/dL (ref 3.5–5.0)
BILIRUBIN TOTAL: 0.6 mg/dL (ref 0.3–1.2)
BUN: 15 mg/dL (ref 6–20)
CALCIUM: 9.5 mg/dL (ref 8.9–10.3)
CO2: 28 mmol/L (ref 22–32)
Chloride: 105 mmol/L (ref 98–111)
Creatinine, Ser: 1.07 mg/dL (ref 0.61–1.24)
Glucose, Bld: 112 mg/dL — ABNORMAL HIGH (ref 70–99)
POTASSIUM: 3.7 mmol/L (ref 3.5–5.1)
Sodium: 143 mmol/L (ref 135–145)
TOTAL PROTEIN: 6.9 g/dL (ref 6.5–8.1)

## 2017-12-29 MED ORDER — DEXAMETHASONE SODIUM PHOSPHATE 10 MG/ML IJ SOLN
10.0000 mg | Freq: Once | INTRAMUSCULAR | Status: AC
  Administered 2017-12-29: 10 mg via INTRAVENOUS

## 2017-12-29 MED ORDER — SODIUM CHLORIDE 0.9% FLUSH
10.0000 mL | INTRAVENOUS | Status: DC | PRN
  Administered 2017-12-29: 10 mL via INTRAVENOUS
  Filled 2017-12-29: qty 10

## 2017-12-29 MED ORDER — ALTEPLASE 2 MG IJ SOLR
INTRAMUSCULAR | Status: AC
  Filled 2017-12-29: qty 2

## 2017-12-29 MED ORDER — SODIUM CHLORIDE 0.9 % IV SOLN
60.0000 mg/m2 | Freq: Once | INTRAVENOUS | Status: AC
  Administered 2017-12-29: 140 mg via INTRAVENOUS
  Filled 2017-12-29: qty 14

## 2017-12-29 MED ORDER — SODIUM CHLORIDE 0.9 % IV SOLN
Freq: Once | INTRAVENOUS | Status: AC
  Administered 2017-12-29: 10:00:00 via INTRAVENOUS

## 2017-12-29 MED ORDER — DEXAMETHASONE SODIUM PHOSPHATE 10 MG/ML IJ SOLN
INTRAMUSCULAR | Status: AC
Start: 2017-12-29 — End: ?
  Filled 2017-12-29: qty 1

## 2017-12-29 MED ORDER — ALTEPLASE 2 MG IJ SOLR
2.0000 mg | Freq: Once | INTRAMUSCULAR | Status: AC | PRN
  Administered 2017-12-29: 2 mg
  Filled 2017-12-29: qty 2

## 2017-12-29 MED ORDER — HEPARIN SOD (PORK) LOCK FLUSH 100 UNIT/ML IV SOLN
500.0000 [IU] | Freq: Once | INTRAVENOUS | Status: DC
  Filled 2017-12-29: qty 5

## 2017-12-29 MED ORDER — SODIUM CHLORIDE 0.9% FLUSH
10.0000 mL | INTRAVENOUS | Status: DC | PRN
  Administered 2017-12-29: 10 mL
  Filled 2017-12-29: qty 10

## 2017-12-29 MED ORDER — HEPARIN SOD (PORK) LOCK FLUSH 100 UNIT/ML IV SOLN
500.0000 [IU] | Freq: Once | INTRAVENOUS | Status: AC | PRN
  Administered 2017-12-29: 500 [IU]
  Filled 2017-12-29: qty 5

## 2017-12-29 NOTE — Progress Notes (Signed)
Hematology and Oncology Follow Up Visit  [name] [id number] 15-Jan-1961 57 y.o. 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  His initial diagnosis in 2012 with a Gleason score 4+5 = 9 and a PSA of 16.6.   Prior Therapy:  He is S/P radical prostatectomy on 04/18/2011 under the care of Dr. Alinda [name]. The final pathological staging was T3b N1 with 9 positive lymph nodes out of 11 examined.   He continued to have persistent PSA and was treated with androgen deprivation. His PSA became undetectable until August 2014.    In June 2016 PSA went up to 2.0 and subsequently to 5.6 in November 2016. Staging workup including a bone scan done on 05/15/2015 which showed a focus of increased uptake in the posterior parietal occipital region of the skull.   Xtandi started on 06/10/2015.  Therapy discontinued in September 2018 because of progression of disease.  Zytiga 1000 mg daily with prednisone at 5 mg daily started in October 2018.  Therapy discontinued in January 2019 because of poor response.  Current therapy:   Androgen deprivation utilizing Lupron 30 mg every 4 months.    Xgeva given every 6 weeks.  Taxotere chemotherapy started on 08/04/2017. He received a 75 mg/m on cycle 1.  He is receiving subsequent cycles at 60 mg/m.  He is here for cycle 8 of therapy of potentially 10 cycles.  Interim History: [name] is here for a follow-up.  Since the last visit, he tolerated the last cycle of chemotherapy without any major complications.  He denies any nausea, vomiting or excessive fatigue.  He denies any infusion related complications or worsening neuropathy.  He does report very mild arthralgias with certain exertion.  He does report rib cage pain with lifting heavy objects.  He does not require any pain medication for this.  He is able to ambulate without any falls or syncope.  His appetite is good  and his weight main stable.  He denies any urination difficulties although he does report nocturia at times.  His quality of life and performance status remained unchanged.  He does not report any blurry vision, syncope or seizures.  He denies neuropathy or mood changes.  Does not report any fevers, chills, sweats.  His weight has increased.  Does not report any chest pain, palpitation, orthopnea or leg edema. Does not report any cough, hemoptysis or wheezing.  Does not report any shortness of breath.  Does not report any nausea, vomiting, abdominal pain.  He denies any changes in his bowel habits.  Does not report any hematochezia, melena.  Does not report any hematuria or dysuria.  He does not report any rash or petechiae.  He does not report any lymphadenopathy or easy bruising.  He denies any heat or cold intolerance.  Remaining review of systems is negative.  The patient is here for evaluation prior to starting cycle #8 of his treatment.  Medications: I have reviewed the patient's current medications.  Current Outpatient Medications  Medication Sig Dispense Refill  . calcium-vitamin D (CALCIUM 500+D) 500-200 MG-UNIT per tablet Take 2 tablets by mouth daily.    Marland Kitchen leuprolide (LUPRON) 22.5 MG injection Inject 22.5 mg into the muscle every 4 (four) months.     . lidocaine-prilocaine (EMLA) cream Apply 1 application topically as needed. (Patient taking differently: Apply 1 application topically as needed. ) 30 g 0  . losartan-hydrochlorothiazide (HYZAAR) 100-12.5 MG tablet  Take 1 tablet by mouth daily.    . predniSONE (DELTASONE) 5 MG tablet TAKE 1 TABLET BY MOUTH EVERY DAY WITH BREAKFAST 30 tablet 0  . HYDROcodone-acetaminophen (NORCO) 5-325 MG tablet 1/2 to 1 Q 4 hours prn pain (Patient not taking: Reported on 12/29/2017) 30 tablet 0  . prochlorperazine (COMPAZINE) 10 MG tablet Take 1 tablet (10 mg total) by mouth every 6 (six) hours as needed for nausea or vomiting. (Patient not taking: Reported on  12/29/2017) 30 tablet 0   Current Facility-Administered Medications  Medication Dose Route Frequency Provider Last Rate Last Dose  . sodium chloride flush (NS) 0.9 % injection 10 mL  10 mL Intracatheter PRN Curtis Portela, MD   10 mL at 12/29/17 0944     Allergies:  Allergies  Allergen Reactions  . Lanolin Itching and Rash    Past Medical History, Surgical history, Social history updated today without any changes.   Physical Exam:   Blood pressure 132/79, pulse 73, temperature 99 F (37.2 C), temperature source Oral, resp. rate 18, height 5\' 9"  (1.753 m), weight 258 lb 1.6 oz (117.1 kg), SpO2 100 %.   ECOG: 0 General appearance: Alert, awake gentleman without distress. Head: Normocephalic without abnormalities. Oropharynx: Mucous membranes are moist and pink. Eyes: Sclera anicteric.   Lymph nodes: No cervical, axillary, inguinal or supraclavicular l adenopathy. Heart: Regular rate without any murmurs or gallops. Lung: Clear in all lung fields without any wheezes or dullness to percussion. Abdomin: Soft, nontender, nondistended with good bowel sounds.  No rebound or guarding. Skin: No ecchymosis or petechiae. Musculoskeletal: No joint deformity or effusion. Neurological: No motor or sensory deficits.    Lab Results: Lab Results  Component Value Date   WBC 8.5 12/29/2017   HGB 13.0 12/29/2017   HCT 38.5 12/29/2017   MCV 92.2 12/29/2017   PLT 294 12/29/2017     Chemistry      Component Value Date/Time   NA 142 12/08/2017 1105   NA 144 06/08/2017 1423   K 3.8 12/08/2017 1105   K 3.4 (L) 06/08/2017 1423   CL 106 12/08/2017 1105   CO2 27 12/08/2017 1105   CO2 24 06/08/2017 1423   BUN 17 12/08/2017 1105   BUN 16.1 06/08/2017 1423   CREATININE 1.14 12/08/2017 1105   CREATININE 1.0 06/08/2017 1423      Component Value Date/Time   CALCIUM 9.4 12/08/2017 1105   CALCIUM 9.6 06/08/2017 1423   ALKPHOS 74 12/08/2017 1105   ALKPHOS 128 06/08/2017 1423   AST 11  12/08/2017 1105   AST 17 06/08/2017 1423   ALT 19 12/08/2017 1105   ALT 12 06/08/2017 1423   BILITOT 0.5 12/08/2017 1105   BILITOT 0.74 06/08/2017 1423       Results for [name], [name] (MRN [id number]) as of 12/29/2017 09:36  Ref. Range 09/15/2017 08:36 10/06/2017 10:24 10/27/2017 08:46 11/17/2017 08:32 12/08/2017 11:05  Prostate Specific Ag, Serum Latest Ref Range: 0.0 - 4.0 ng/mL 89.9 (H) 83.2 (H) 83.8 (H) 89.0 (H) 83.4 (H)   Impression and Plan:  57 year old man with:   1.  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  He has progressed on multiple therapies outlined above.  He continues to receive Taxotere chemotherapy that has been overall tolerable at the current dose and schedule.  He does not report any complications or need for a dose reduction or delay.  Risks and benefits of continuing his chemotherapy as well as  the rationale for alternative therapy was reviewed today.  His PSA has remained overall relatively stable with less than adequate response overall.  Alternative therapy would include switching him to [facility] chemotherapy.  After discussion today, we have elected to continue with the same dose and schedule of Taxotere.  He will proceed with cycle #8 of his treatment today as scheduled.  2. Androgen depravation: Risks and benefits of continuing this therapy was reviewed today.  Lupron was last given on 12/08/2017.  It is given every 4 months.  3. Bone directed therapy: No complications related to Xgeva at this time.  Long-term complication associated with this medication include osteonecrosis of the jaw and hypocalcemia were reviewed today with the patient.  He is agreeable to continue.  Last dose was given on 12/08/2017.  4.  IV access: Port-A-Cath has been used effectively without complications.  5.  Neutropenia prophylaxis: Will receive growth factor support after each cycle of chemotherapy.  7.  Nausea prophylaxis:  No issues reported with nausea and vomiting.  He has adequate antiemetics.  8.  Goals of care: Therapy remains palliative at this time.  His performance status remains excellent and aggressive therapy is warranted.  9. Follow-up: Will be in 3 weeks for evaluation prior to cycle #9 of his treatment.  Mikey Bussing, DNP, AGPCNP-BC, AOCNP 7/5/201910:37 AM

## 2017-12-29 NOTE — Patient Instructions (Signed)
[facility] Discharge Instructions for Patients Receiving Chemotherapy  Today you received the following chemotherapy agents: Docetaxel (Taxotere).  To help prevent nausea and vomiting after your treatment, we encourage you to take your nausea medication as prescribed.  If you develop nausea and vomiting that is not controlled by your nausea medication, call the clinic.   BELOW ARE SYMPTOMS THAT SHOULD BE REPORTED IMMEDIATELY:  *FEVER GREATER THAN 100.5 F  *CHILLS WITH OR WITHOUT FEVER  NAUSEA AND VOMITING THAT IS NOT CONTROLLED WITH YOUR NAUSEA MEDICATION  *UNUSUAL SHORTNESS OF BREATH  *UNUSUAL BRUISING OR BLEEDING  TENDERNESS IN MOUTH AND THROAT WITH OR WITHOUT PRESENCE OF ULCERS  *URINARY PROBLEMS  *BOWEL PROBLEMS  UNUSUAL RASH Items with * indicate a potential emergency and should be followed up as soon as possible.  Feel free to call the clinic should you have any questions or concerns. The clinic phone number is [phone].  Please show the CHEMO ALERT CARD at check-in to the Emergency Department and triage nurse.   

## 2017-12-30 LAB — PROSTATE-SPECIFIC AG, SERUM (LABCORP): Prostate Specific Ag, Serum: 71 ng/mL — ABNORMAL HIGH (ref 0.0–4.0)

## 2018-01-01 ENCOUNTER — Inpatient Hospital Stay: Payer: 59

## 2018-01-01 VITALS — BP 117/75 | HR 84 | Temp 98.6°F | Resp 16

## 2018-01-01 DIAGNOSIS — C61 Malignant neoplasm of prostate: Secondary | ICD-10-CM

## 2018-01-01 DIAGNOSIS — Z5111 Encounter for antineoplastic chemotherapy: Secondary | ICD-10-CM | POA: Diagnosis not present

## 2018-01-01 MED ORDER — PEGFILGRASTIM-CBQV 6 MG/0.6ML ~~LOC~~ SOSY
6.0000 mg | PREFILLED_SYRINGE | Freq: Once | SUBCUTANEOUS | Status: AC
  Administered 2018-01-01: 6 mg via SUBCUTANEOUS

## 2018-01-01 MED ORDER — PEGFILGRASTIM-CBQV 6 MG/0.6ML ~~LOC~~ SOSY
PREFILLED_SYRINGE | SUBCUTANEOUS | Status: AC
  Filled 2018-01-01: qty 0.6

## 2018-01-01 NOTE — Patient Instructions (Signed)
Pegfilgrastim injection What is this medicine? PEGFILGRASTIM (PEG fil gra stim) is a long-acting granulocyte colony-stimulating factor that stimulates the growth of neutrophils, a type of white blood cell important in the body's fight against infection. It is used to reduce the incidence of fever and infection in patients with certain types of cancer who are receiving chemotherapy that affects the bone marrow, and to increase survival after being exposed to high doses of radiation. This medicine may be used for other purposes; ask your health care provider or pharmacist if you have questions. COMMON BRAND NAME(S): Neulasta What should I tell my health care provider before I take this medicine? They need to know if you have any of these conditions: -kidney disease -latex allergy -ongoing radiation therapy -sickle cell disease -skin reactions to acrylic adhesives (On-Body Injector only) -an unusual or allergic reaction to pegfilgrastim, filgrastim, other medicines, foods, dyes, or preservatives -pregnant or trying to get pregnant -breast-feeding How should I use this medicine? This medicine is for injection under the skin. If you get this medicine at home, you will be taught how to prepare and give the pre-filled syringe or how to use the On-body Injector. Refer to the patient Instructions for Use for detailed instructions. Use exactly as directed. Tell your healthcare provider immediately if you suspect that the On-body Injector may not have performed as intended or if you suspect the use of the On-body Injector resulted in a missed or partial dose. It is important that you put your used needles and syringes in a special sharps container. Do not put them in a trash can. If you do not have a sharps container, call your pharmacist or healthcare provider to get one. Talk to your pediatrician regarding the use of this medicine in children. While this drug may be prescribed for selected conditions,  precautions do apply. Overdosage: If you think you have taken too much of this medicine contact a poison control center or emergency room at once. NOTE: This medicine is only for you. Do not share this medicine with others. What if I miss a dose? It is important not to miss your dose. Call your doctor or health care professional if you miss your dose. If you miss a dose due to an On-body Injector failure or leakage, a new dose should be administered as soon as possible using a single prefilled syringe for manual use. What may interact with this medicine? Interactions have not been studied. Give your health care provider a list of all the medicines, herbs, non-prescription drugs, or dietary supplements you use. Also tell them if you smoke, drink alcohol, or use illegal drugs. Some items may interact with your medicine. This list may not describe all possible interactions. Give your health care provider a list of all the medicines, herbs, non-prescription drugs, or dietary supplements you use. Also tell them if you smoke, drink alcohol, or use illegal drugs. Some items may interact with your medicine. What should I watch for while using this medicine? You may need blood work done while you are taking this medicine. If you are going to need a MRI, CT scan, or other procedure, tell your doctor that you are using this medicine (On-Body Injector only). What side effects may I notice from receiving this medicine? Side effects that you should report to your doctor or health care professional as soon as possible: -allergic reactions like skin rash, itching or hives, swelling of the face, lips, or tongue -dizziness -fever -pain, redness, or irritation at site   where injected -pinpoint red spots on the skin -red or dark-brown urine -shortness of breath or breathing problems -stomach or side pain, or pain at the shoulder -swelling -tiredness -trouble passing urine or change in the amount of urine Side  effects that usually do not require medical attention (report to your doctor or health care professional if they continue or are bothersome): -bone pain -muscle pain This list may not describe all possible side effects. Call your doctor for medical advice about side effects. You may report side effects to FDA at 1-800-FDA-1088. Where should I keep my medicine? Keep out of the reach of children. Store pre-filled syringes in a refrigerator between 2 and 8 degrees C (36 and 46 degrees F). Do not freeze. Keep in carton to protect from light. Throw away this medicine if it is left out of the refrigerator for more than 48 hours. Throw away any unused medicine after the expiration date. NOTE: This sheet is a summary. It may not cover all possible information. If you have questions about this medicine, talk to your doctor, pharmacist, or health care provider.  2018 [employer]vier/Gold Standard (2016-06-09 12:58:03)  

## 2018-01-02 ENCOUNTER — Other Ambulatory Visit: Payer: Self-pay

## 2018-01-02 ENCOUNTER — Telehealth: Payer: Self-pay

## 2018-01-02 DIAGNOSIS — C61 Malignant neoplasm of prostate: Secondary | ICD-10-CM

## 2018-01-02 MED ORDER — PREDNISONE 5 MG PO TABS: 5.0000 mg | ORAL_TABLET | Freq: Every day | ORAL | 0 refills | Status: DC

## 2018-01-02 NOTE — Telephone Encounter (Signed)
Per Dr. Alen Blew, shared with pt wife, Jana Half, that PSA is now 48. Lower than 3 weeks ago. Pt wife verbalized understanding and thanks for the notification.

## 2018-01-19 ENCOUNTER — Inpatient Hospital Stay: Payer: 59

## 2018-01-19 ENCOUNTER — Inpatient Hospital Stay (HOSPITAL_BASED_OUTPATIENT_CLINIC_OR_DEPARTMENT_OTHER): Payer: 59 | Admitting: Oncology

## 2018-01-19 ENCOUNTER — Telehealth: Payer: Self-pay | Admitting: Oncology

## 2018-01-19 VITALS — BP 123/72 | HR 80 | Temp 98.7°F | Resp 18 | Ht 69.0 in | Wt 255.6 lb

## 2018-01-19 DIAGNOSIS — Z95828 Presence of other vascular implants and grafts: Secondary | ICD-10-CM

## 2018-01-19 DIAGNOSIS — C61 Malignant neoplasm of prostate: Secondary | ICD-10-CM | POA: Diagnosis not present

## 2018-01-19 DIAGNOSIS — Z79899 Other long term (current) drug therapy: Secondary | ICD-10-CM | POA: Diagnosis not present

## 2018-01-19 DIAGNOSIS — Z5111 Encounter for antineoplastic chemotherapy: Secondary | ICD-10-CM | POA: Diagnosis not present

## 2018-01-19 LAB — CBC WITH DIFFERENTIAL (CANCER CENTER ONLY)
BASOS ABS: 0.1 10*3/uL (ref 0.0–0.1)
BASOS PCT: 1 %
EOS PCT: 0 %
Eosinophils Absolute: 0 10*3/uL (ref 0.0–0.5)
HCT: 40.5 % (ref 38.4–49.9)
HEMOGLOBIN: 13 g/dL (ref 13.0–17.1)
Lymphocytes Relative: 18 %
Lymphs Abs: 1.3 10*3/uL (ref 0.9–3.3)
MCH: 30 pg (ref 27.2–33.4)
MCHC: 32.1 g/dL (ref 32.0–36.0)
MCV: 93.5 fL (ref 79.3–98.0)
Monocytes Absolute: 0.7 10*3/uL (ref 0.1–0.9)
Monocytes Relative: 9 %
NEUTROS PCT: 72 %
Neutro Abs: 5.4 10*3/uL (ref 1.5–6.5)
PLATELETS: 280 10*3/uL (ref 140–400)
RBC: 4.33 MIL/uL (ref 4.20–5.82)
RDW: 15.1 % — ABNORMAL HIGH (ref 11.0–14.6)
WBC: 7.4 10*3/uL (ref 4.0–10.3)

## 2018-01-19 LAB — CMP (CANCER CENTER ONLY)
ALBUMIN: 3.7 g/dL (ref 3.5–5.0)
ALT: 28 U/L (ref 0–44)
AST: 12 U/L — ABNORMAL LOW (ref 15–41)
Alkaline Phosphatase: 69 U/L (ref 38–126)
Anion gap: 10 (ref 5–15)
BUN: 18 mg/dL (ref 6–20)
CHLORIDE: 105 mmol/L (ref 98–111)
CO2: 27 mmol/L (ref 22–32)
CREATININE: 1.16 mg/dL (ref 0.61–1.24)
Calcium: 9.5 mg/dL (ref 8.9–10.3)
GFR, Estimated: >60 mL/min (ref >60)
GLUCOSE: 124 mg/dL — AB (ref 70–99)
Potassium: 3.3 mmol/L — ABNORMAL LOW (ref 3.5–5.1)
SODIUM: 142 mmol/L (ref 135–145)
Total Bilirubin: 0.5 mg/dL (ref 0.3–1.2)
Total Protein: 6.6 g/dL (ref 6.5–8.1)

## 2018-01-19 MED ORDER — DEXAMETHASONE SODIUM PHOSPHATE 10 MG/ML IJ SOLN
INTRAMUSCULAR | Status: AC
Start: 2018-01-19 — End: ?
  Filled 2018-01-19: qty 1

## 2018-01-19 MED ORDER — SODIUM CHLORIDE 0.9% FLUSH
10.0000 mL | INTRAVENOUS | Status: DC | PRN
  Administered 2018-01-19: 10 mL
  Filled 2018-01-19: qty 10

## 2018-01-19 MED ORDER — DENOSUMAB 120 MG/1.7ML ~~LOC~~ SOLN
120.0000 mg | Freq: Once | SUBCUTANEOUS | Status: AC
  Administered 2018-01-19: 120 mg via SUBCUTANEOUS

## 2018-01-19 MED ORDER — HEPARIN SOD (PORK) LOCK FLUSH 100 UNIT/ML IV SOLN
500.0000 [IU] | Freq: Once | INTRAVENOUS | Status: AC | PRN
Start: 2018-01-19 — End: 2018-01-19
  Administered 2018-01-19: 500 [IU]
  Filled 2018-01-19: qty 5

## 2018-01-19 MED ORDER — DEXAMETHASONE SODIUM PHOSPHATE 10 MG/ML IJ SOLN
10.0000 mg | Freq: Once | INTRAMUSCULAR | Status: AC
  Administered 2018-01-19: 10 mg via INTRAVENOUS

## 2018-01-19 MED ORDER — DOCETAXEL CHEMO INJECTION 160 MG/16ML
60.0000 mg/m2 | Freq: Once | INTRAVENOUS | Status: AC
  Administered 2018-01-19: 140 mg via INTRAVENOUS
  Filled 2018-01-19: qty 14

## 2018-01-19 MED ORDER — DENOSUMAB 120 MG/1.7ML ~~LOC~~ SOLN
SUBCUTANEOUS | Status: AC
  Filled 2018-01-19: qty 1.7

## 2018-01-19 MED ORDER — SODIUM CHLORIDE 0.9% FLUSH
10.0000 mL | Freq: Once | INTRAVENOUS | Status: AC
  Administered 2018-01-19: 10 mL
  Filled 2018-01-19: qty 10

## 2018-01-19 MED ORDER — SODIUM CHLORIDE 0.9 % IV SOLN
Freq: Once | INTRAVENOUS | Status: AC
  Administered 2018-01-19: 10:00:00 via INTRAVENOUS
  Filled 2018-01-19: qty 250

## 2018-01-19 NOTE — Patient Instructions (Signed)
[facility] Discharge Instructions for Patients Receiving Chemotherapy  Today you received the following chemotherapy agents: Docetaxel (Taxotere)  To help prevent nausea and vomiting after your treatment, we encourage you to take your nausea medication as prescribed.    If you develop nausea and vomiting that is not controlled by your nausea medication, call the clinic.   BELOW ARE SYMPTOMS THAT SHOULD BE REPORTED IMMEDIATELY:  *FEVER GREATER THAN 100.5 F  *CHILLS WITH OR WITHOUT FEVER  NAUSEA AND VOMITING THAT IS NOT CONTROLLED WITH YOUR NAUSEA MEDICATION  *UNUSUAL SHORTNESS OF BREATH  *UNUSUAL BRUISING OR BLEEDING  TENDERNESS IN MOUTH AND THROAT WITH OR WITHOUT PRESENCE OF ULCERS  *URINARY PROBLEMS  *BOWEL PROBLEMS  UNUSUAL RASH Items with * indicate a potential emergency and should be followed up as soon as possible.  Feel free to call the clinic should you have any questions or concerns. The clinic phone number is [phone].  Please show the [facility] at check-in to the Emergency Department and triage nurse.  Denosumab injection What is this medicine? DENOSUMAB (den oh sue mab) slows bone breakdown. Prolia is used to treat osteoporosis in women after menopause and in men. Delton See is used to treat a high calcium level due to cancer and to prevent bone fractures and other bone problems caused by multiple myeloma or cancer bone metastases. Delton See is also used to treat giant cell tumor of the bone. This medicine may be used for other purposes; ask your health care provider or pharmacist if you have questions. COMMON BRAND NAME(S): Prolia, XGEVA What should I tell my health care provider before I take this medicine? They need to know if you have any of these conditions: -dental disease -having surgery or tooth extraction -infection -kidney disease -low levels of calcium or Vitamin D in the blood -malnutrition -on hemodialysis -skin conditions or  sensitivity -thyroid or parathyroid disease -an unusual reaction to denosumab, other medicines, foods, dyes, or preservatives -pregnant or trying to get pregnant -breast-feeding How should I use this medicine? This medicine is for injection under the skin. It is given by a health care professional in a hospital or clinic setting. If you are getting Prolia, a special MedGuide will be given to you by the pharmacist with each prescription and refill. Be sure to read this information carefully each time. For Prolia, talk to your pediatrician regarding the use of this medicine in children. Special care may be needed. For Delton See, talk to your pediatrician regarding the use of this medicine in children. While this drug may be prescribed for children as young as 13 years for selected conditions, precautions do apply. Overdosage: If you think you have taken too much of this medicine contact a poison control center or emergency room at once. NOTE: This medicine is only for you. Do not share this medicine with others. What if I miss a dose? It is important not to miss your dose. Call your doctor or health care professional if you are unable to keep an appointment. What may interact with this medicine? Do not take this medicine with any of the following medications: -other medicines containing denosumab This medicine may also interact with the following medications: -medicines that lower your chance of fighting infection -steroid medicines like prednisone or cortisone This list may not describe all possible interactions. Give your health care provider a list of all the medicines, herbs, non-prescription drugs, or dietary supplements you use. Also tell them if you smoke, drink alcohol, or  use illegal drugs. Some items may interact with your medicine. What should I watch for while using this medicine? Visit your doctor or health care professional for regular checks on your progress. Your doctor or health care  professional may order blood tests and other tests to see how you are doing. Call your doctor or health care professional for advice if you get a fever, chills or sore throat, or other symptoms of a cold or flu. Do not treat yourself. This drug may decrease your body's ability to fight infection. Try to avoid being around people who are sick. You should make sure you get enough calcium and vitamin D while you are taking this medicine, unless your doctor tells you not to. Discuss the foods you eat and the vitamins you take with your health care professional. See your dentist regularly. Brush and floss your teeth as directed. Before you have any dental work done, tell your dentist you are receiving this medicine. Do not become pregnant while taking this medicine or for 5 months after stopping it. Talk with your doctor or health care professional about your birth control options while taking this medicine. Women should inform their doctor if they wish to become pregnant or think they might be pregnant. There is a potential for serious side effects to an unborn child. Talk to your health care professional or pharmacist for more information. What side effects may I notice from receiving this medicine? Side effects that you should report to your doctor or health care professional as soon as possible: -allergic reactions like skin rash, itching or hives, swelling of the face, lips, or tongue -bone pain -breathing problems -dizziness -jaw pain, especially after dental work -redness, blistering, peeling of the skin -signs and symptoms of infection like fever or chills; cough; sore throat; pain or trouble passing urine -signs of low calcium like fast heartbeat, muscle cramps or muscle pain; pain, tingling, numbness in the hands or feet; seizures -unusual bleeding or bruising -unusually weak or tired Side effects that usually do not require medical attention (report to your doctor or health care professional  if they continue or are bothersome): -constipation -diarrhea -headache -joint pain -loss of appetite -muscle pain -runny nose -tiredness -upset stomach This list may not describe all possible side effects. Call your doctor for medical advice about side effects. You may report side effects to FDA at 1-800-FDA-1088. Where should I keep my medicine? This medicine is only given in a clinic, doctor's office, or other health care setting and will not be stored at home. NOTE: This sheet is a summary. It may not cover all possible information. If you have questions about this medicine, talk to your doctor, pharmacist, or health care provider.  2018 Elsevier/Gold Standard (2016-07-05 19:17:21)

## 2018-01-19 NOTE — Telephone Encounter (Signed)
Scheduled appt per 7/26 los - gave patient AVS and calender per los. Central radiology to contact patient with ct .

## 2018-01-19 NOTE — Progress Notes (Signed)
Hematology and Oncology Follow Up Visit  [name] [id number] [birth year] 57 y.o. 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  He developed disease to the bone in 2016.   Prior Therapy:  He is S/P radical prostatectomy on 04/18/2011 under the care of Dr. Alinda Money. The final pathological staging was T3b N1 with 9 positive lymph nodes out of 11 examined.   He continued to have persistent PSA and was treated with androgen deprivation. His PSA became undetectable until August 2014.    In June 2016 PSA went up to 2.0 and subsequently to 5.6 in November 2016. Staging workup including a bone scan done on 05/15/2015 which showed a focus of increased uptake in the posterior parietal occipital region of the skull.   Xtandi started on 06/10/2015.  Therapy discontinued in September 2018 because of progression of disease.  Zytiga 1000 mg daily with prednisone at 5 mg daily started in October 2018.  Therapy discontinued in January 2019 because of poor response.  Current therapy:   Androgen deprivation utilizing Lupron 30 mg every 4 months.    Xgeva given every 6 weeks.  Taxotere chemotherapy started on 08/04/2017. He received a 75 mg/m on cycle 1.  He is receiving subsequent cycles at 60 mg/m.  He is here for cycle 9 of therapy.  Interim History: [name] presents today for a follow-up.  Since the last visit, he received the last cycle of chemotherapy without any major complication.  He denies any nausea, vomiting or infusion related complications.  He denies any peripheral neuropathy.  He does have fatigue associated with this therapy which limits his exercise tolerance.  He is limited by his ability to walk long distances as well as his ability to lift any objects.  His appetite and performance status remains adequate.  He does not report any new bone pain and does not take any pain  medication.  Continues to tolerate Xgeva and Lupron as well without any new complications.   He does not report any blurry vision, syncope or seizures.  He denies any alteration mental status or confusion.  Does not report any fevers, chills, sweats.  Appetite remained excellent.  Does not report any chest pain, palpitation, orthopnea or leg edema. Does not report any cough, hemoptysis or wheezing.  Does not report any nausea, vomiting, abdominal pain.  He denies any constipation or diarrhea.  Does not report any hematochezia, melena.  Does not report frequency urgency or hesitancy.  He does not report any rash or petechiae.  He does not report any lymphadenopathy or easy bruising.  He denies any mood changes.  Remaining review of systems is negative.  Medications: I have reviewed the patient's current medications.  Current Outpatient Medications  Medication Sig Dispense Refill  . calcium-vitamin D (CALCIUM 500+D) 500-200 MG-UNIT per tablet Take 2 tablets by mouth daily.    Marland Kitchen HYDROcodone-acetaminophen (NORCO) 5-325 MG tablet 1/2 to 1 Q 4 hours prn pain (Patient not taking: Reported on 12/29/2017) 30 tablet 0  . leuprolide (LUPRON) 22.5 MG injection Inject 22.5 mg into the muscle every 4 (four) months.     . lidocaine-prilocaine (EMLA) cream Apply 1 application topically as needed. (Patient taking differently: Apply 1 application topically as needed. ) 30 g 0  . losartan-hydrochlorothiazide (HYZAAR) 100-12.5 MG tablet Take 1 tablet by mouth daily.    . predniSONE (DELTASONE) 5 MG tablet Take 1  tablet (5 mg total) by mouth daily with breakfast. 30 tablet 0  . prochlorperazine (COMPAZINE) 10 MG tablet Take 1 tablet (10 mg total) by mouth every 6 (six) hours as needed for nausea or vomiting. (Patient not taking: Reported on 12/29/2017) 30 tablet 0   No current facility-administered medications for this visit.      Allergies:  Allergies  Allergen Reactions  . Lanolin Itching and Rash    Past Medical  History, Surgical history, Social history updated today without any changes.   Physical Exam:   Blood pressure 123/72, pulse 80, temperature 98.7 F (37.1 C), temperature source Oral, resp. rate 18, height 5\' 9"  (1.753 m), weight 255 lb 9.6 oz (115.9 kg), SpO2 100 %.    ECOG: 0 General appearance: Well-appearing gentleman without distress. Head: Atraumatic without normalities. Oropharynx: Oral thrush or ulcers. Eyes: Pupils are equal and round reactive to light. Lymph nodes: No adenopathy noted in the cervical, axillary, inguinal or supraclavicular lymph node regions. Heart: Regular rate that any murmurs or gallops.  S1 and S2 without leg edema Lung: Clear without any rhonchi, wheezes or dullness to percussion. Abdomin: Soft, without any rebound or guarding.  No shifting dullness or ascites. Skin: Is moist without ecchymosis or petechiae. Musculoskeletal: No clubbing or cyanosis. Neurological: intact motor and sensory exam.    Lab Results: Lab Results  Component Value Date   WBC 8.5 12/29/2017   HGB 13.0 12/29/2017   HCT 38.5 12/29/2017   MCV 92.2 12/29/2017   PLT 294 12/29/2017     Chemistry      Component Value Date/Time   NA 143 12/29/2017 0850   NA 144 06/08/2017 1423   K 3.7 12/29/2017 0850   K 3.4 (L) 06/08/2017 1423   CL 105 12/29/2017 0850   CO2 28 12/29/2017 0850   CO2 24 06/08/2017 1423   BUN 15 12/29/2017 0850   BUN 16.1 06/08/2017 1423   CREATININE 1.07 12/29/2017 0850   CREATININE 1.14 12/08/2017 1105   CREATININE 1.0 06/08/2017 1423      Component Value Date/Time   CALCIUM 9.5 12/29/2017 0850   CALCIUM 9.6 06/08/2017 1423   ALKPHOS 58 12/29/2017 0850   ALKPHOS 128 06/08/2017 1423   AST 18 12/29/2017 0850   AST 11 12/08/2017 1105   AST 17 06/08/2017 1423   ALT 28 12/29/2017 0850   ALT 19 12/08/2017 1105   ALT 12 06/08/2017 1423   BILITOT 0.6 12/29/2017 0850   BILITOT 0.5 12/08/2017 1105   BILITOT 0.74 06/08/2017 1423        Results for  Binz, [name] (MRN [id number]) as of 01/19/2018 08:33  Ref. Range 11/17/2017 08:32 12/08/2017 11:05 12/29/2017 09:40  Prostate Specific Ag, Serum Latest Ref Range: 0.0 - 4.0 ng/mL 89.0 (H) 83.4 (H) 71.0 (H)     Impression and Plan:  57 year old man with:   1.  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   He is currently on Taxotere chemotherapy with reasonable tolerance.  His PSA is showing reasonable decline at this time currently at 71 from 17.  The natural course of this disease was discussed again as well as treatment options.  The plan is to complete 10 cycles of Taxotere chemotherapy and repeat imaging studies with CT scan chest abdomen and pelvis and determine the next course of action.  If he continues to respond to chemotherapy with reasonable tolerance, the plan is to continue with as long as he is responding and he can tolerate.  He has progression of disease, different therapy will be recommended.    2. Androgen depravation: Risks and benefits of continuing Lupron indefinitely was discussed.  His next Lupron will be given in October 2019.   3. Bone directed therapy: Remains on Xgeva without any complications.  Long-term complications with this therapy was discussed which include osteonecrosis of the jaw and hypocalcemia as a potential complications.  He is agreeable to continue at this time.  4.  IV access: Port-A-Cath remains in use without any issues.  5.  Neutropenia prophylaxis: No evidence of neutropenic sepsis and he will continue to receive growth factor support after each therapy.  7.  Nausea prophylaxis: No issues reported at this time.  He has adequate antiemetics.  8.  Goals of care: Treatment is palliative however his performance status is excellent and aggressive therapy is recommended.  9.  Work-related considerations: He will not be able to return to work in the immediate future given his decline in his exercise capacity and he is  inability to perform work-related duties because of his cancer and cancer treatment.  10. Follow-up: Will be in 3 weeks for cycle 10.  25  minutes was spent with the patient face-to-face today.  More than 50% of time was dedicated to patient counseling, education and discussing the natural course of his disease as well as coordinating his future care  Zola Button, MD 7/26/20198:33 AM

## 2018-01-20 LAB — PROSTATE-SPECIFIC AG, SERUM (LABCORP): Prostate Specific Ag, Serum: 84.3 ng/mL — ABNORMAL HIGH (ref 0.0–4.0)

## 2018-01-22 ENCOUNTER — Telehealth: Payer: Self-pay | Admitting: *Deleted

## 2018-01-22 ENCOUNTER — Inpatient Hospital Stay: Payer: 59

## 2018-01-22 VITALS — BP 148/87 | HR 90 | Temp 98.6°F | Resp 20

## 2018-01-22 DIAGNOSIS — C61 Malignant neoplasm of prostate: Secondary | ICD-10-CM

## 2018-01-22 DIAGNOSIS — Z5111 Encounter for antineoplastic chemotherapy: Secondary | ICD-10-CM | POA: Diagnosis not present

## 2018-01-22 MED ORDER — PEGFILGRASTIM-CBQV 6 MG/0.6ML ~~LOC~~ SOSY
PREFILLED_SYRINGE | SUBCUTANEOUS | Status: AC
  Filled 2018-01-22: qty 0.6

## 2018-01-22 MED ORDER — PEGFILGRASTIM-CBQV 6 MG/0.6ML ~~LOC~~ SOSY
6.0000 mg | PREFILLED_SYRINGE | Freq: Once | SUBCUTANEOUS | Status: AC
  Administered 2018-01-22: 6 mg via SUBCUTANEOUS

## 2018-01-22 NOTE — Patient Instructions (Signed)
Pegfilgrastim injection What is this medicine? PEGFILGRASTIM (PEG fil gra stim) is a long-acting granulocyte colony-stimulating factor that stimulates the growth of neutrophils, a type of white blood cell important in the body's fight against infection. It is used to reduce the incidence of fever and infection in patients with certain types of cancer who are receiving chemotherapy that affects the bone marrow, and to increase survival after being exposed to high doses of radiation. This medicine may be used for other purposes; ask your health care provider or pharmacist if you have questions. COMMON BRAND NAME(S): Neulasta What should I tell my health care provider before I take this medicine? They need to know if you have any of these conditions: -kidney disease -latex allergy -ongoing radiation therapy -sickle cell disease -skin reactions to acrylic adhesives (On-Body Injector only) -an unusual or allergic reaction to pegfilgrastim, filgrastim, other medicines, foods, dyes, or preservatives -pregnant or trying to get pregnant -breast-feeding How should I use this medicine? This medicine is for injection under the skin. If you get this medicine at home, you will be taught how to prepare and give the pre-filled syringe or how to use the On-body Injector. Refer to the patient Instructions for Use for detailed instructions. Use exactly as directed. Tell your healthcare provider immediately if you suspect that the On-body Injector may not have performed as intended or if you suspect the use of the On-body Injector resulted in a missed or partial dose. It is important that you put your used needles and syringes in a special sharps container. Do not put them in a trash can. If you do not have a sharps container, call your pharmacist or healthcare provider to get one. Talk to your pediatrician regarding the use of this medicine in children. While this drug may be prescribed for selected conditions,  precautions do apply. Overdosage: If you think you have taken too much of this medicine contact a poison control center or emergency room at once. NOTE: This medicine is only for you. Do not share this medicine with others. What if I miss a dose? It is important not to miss your dose. Call your doctor or health care professional if you miss your dose. If you miss a dose due to an On-body Injector failure or leakage, a new dose should be administered as soon as possible using a single prefilled syringe for manual use. What may interact with this medicine? Interactions have not been studied. Give your health care provider a list of all the medicines, herbs, non-prescription drugs, or dietary supplements you use. Also tell them if you smoke, drink alcohol, or use illegal drugs. Some items may interact with your medicine. This list may not describe all possible interactions. Give your health care provider a list of all the medicines, herbs, non-prescription drugs, or dietary supplements you use. Also tell them if you smoke, drink alcohol, or use illegal drugs. Some items may interact with your medicine. What should I watch for while using this medicine? You may need blood work done while you are taking this medicine. If you are going to need a MRI, CT scan, or other procedure, tell your doctor that you are using this medicine (On-Body Injector only). What side effects may I notice from receiving this medicine? Side effects that you should report to your doctor or health care professional as soon as possible: -allergic reactions like skin rash, itching or hives, swelling of the face, lips, or tongue -dizziness -fever -pain, redness, or irritation at site   where injected -pinpoint red spots on the skin -red or dark-brown urine -shortness of breath or breathing problems -stomach or side pain, or pain at the shoulder -swelling -tiredness -trouble passing urine or change in the amount of urine Side  effects that usually do not require medical attention (report to your doctor or health care professional if they continue or are bothersome): -bone pain -muscle pain This list may not describe all possible side effects. Call your doctor for medical advice about side effects. You may report side effects to FDA at 1-800-FDA-1088. Where should I keep my medicine? Keep out of the reach of children. Store pre-filled syringes in a refrigerator between 2 and 8 degrees C (36 and 46 degrees F). Do not freeze. Keep in carton to protect from light. Throw away this medicine if it is left out of the refrigerator for more than 48 hours. Throw away any unused medicine after the expiration date. NOTE: This sheet is a summary. It may not cover all possible information. If you have questions about this medicine, talk to your doctor, pharmacist, or health care provider.  2018 [employer]vier/Gold Standard (2016-06-09 12:58:03)  

## 2018-01-22 NOTE — Telephone Encounter (Signed)
-----   Message from [name], MD sent at 01/22/2018  8:29 AM EDT ----- Please let him know his PSA is up. No change for now.

## 2018-01-22 NOTE — Telephone Encounter (Signed)
Spoke with patient's wife. Gave results of last PSA. No change for now.

## 2018-01-28 ENCOUNTER — Other Ambulatory Visit: Payer: Self-pay | Admitting: Oncology

## 2018-01-28 DIAGNOSIS — C61 Malignant neoplasm of prostate: Secondary | ICD-10-CM

## 2018-02-01 NOTE — Progress Notes (Signed)
FMLA successfully faxed to [employer] at [phone]. Mailed copy to patient address on file. 

## 2018-02-06 ENCOUNTER — Telehealth: Payer: Self-pay | Admitting: *Deleted

## 2018-02-06 NOTE — Telephone Encounter (Signed)
Patient calling to ask if a bone scan was also ordered with the CT scan. Only CT ordered at this time.

## 2018-02-09 ENCOUNTER — Inpatient Hospital Stay: Payer: 59

## 2018-02-09 ENCOUNTER — Telehealth: Payer: Self-pay | Admitting: Oncology

## 2018-02-09 ENCOUNTER — Inpatient Hospital Stay: Payer: 59 | Attending: Oncology

## 2018-02-09 ENCOUNTER — Inpatient Hospital Stay (HOSPITAL_BASED_OUTPATIENT_CLINIC_OR_DEPARTMENT_OTHER): Payer: 59 | Admitting: Oncology

## 2018-02-09 VITALS — BP 124/70 | HR 66 | Temp 98.4°F | Resp 18 | Ht 69.0 in | Wt 252.9 lb

## 2018-02-09 DIAGNOSIS — C7951 Secondary malignant neoplasm of bone: Secondary | ICD-10-CM | POA: Insufficient documentation

## 2018-02-09 DIAGNOSIS — Z95828 Presence of other vascular implants and grafts: Secondary | ICD-10-CM

## 2018-02-09 DIAGNOSIS — Z5111 Encounter for antineoplastic chemotherapy: Secondary | ICD-10-CM | POA: Diagnosis present

## 2018-02-09 DIAGNOSIS — C61 Malignant neoplasm of prostate: Secondary | ICD-10-CM

## 2018-02-09 DIAGNOSIS — Z79899 Other long term (current) drug therapy: Secondary | ICD-10-CM | POA: Diagnosis not present

## 2018-02-09 LAB — CBC WITH DIFFERENTIAL (CANCER CENTER ONLY)
Basophils Absolute: 0 10*3/uL (ref 0.0–0.1)
Basophils Relative: 1 %
EOS PCT: 0 %
Eosinophils Absolute: 0 10*3/uL (ref 0.0–0.5)
HEMATOCRIT: 36.9 % — AB (ref 38.4–49.9)
HEMOGLOBIN: 12 g/dL — AB (ref 13.0–17.1)
LYMPHS ABS: 1 10*3/uL (ref 0.9–3.3)
LYMPHS PCT: 13 %
MCH: 30.2 pg (ref 27.2–33.4)
MCHC: 32.5 g/dL (ref 32.0–36.0)
MCV: 92.7 fL (ref 79.3–98.0)
Monocytes Absolute: 0.6 10*3/uL (ref 0.1–0.9)
Monocytes Relative: 8 %
NEUTROS ABS: 6.4 10*3/uL (ref 1.5–6.5)
Neutrophils Relative %: 78 %
PLATELETS: 263 10*3/uL (ref 140–400)
RBC: 3.98 MIL/uL — AB (ref 4.20–5.82)
RDW: 15.2 % — ABNORMAL HIGH (ref 11.0–14.6)
WBC: 8.1 10*3/uL (ref 4.0–10.3)

## 2018-02-09 LAB — CMP (CANCER CENTER ONLY)
ALBUMIN: 3.5 g/dL (ref 3.5–5.0)
ALT: 26 U/L (ref 0–44)
ANION GAP: 9 (ref 5–15)
AST: 13 U/L — AB (ref 15–41)
Alkaline Phosphatase: 59 U/L (ref 38–126)
BUN: 17 mg/dL (ref 6–20)
CHLORIDE: 107 mmol/L (ref 98–111)
CO2: 25 mmol/L (ref 22–32)
Calcium: 8.9 mg/dL (ref 8.9–10.3)
Creatinine: 1.21 mg/dL (ref 0.61–1.24)
GFR, Est AFR Am: >60 mL/min (ref >60)
Glucose, Bld: 121 mg/dL — ABNORMAL HIGH (ref 70–99)
POTASSIUM: 3.6 mmol/L (ref 3.5–5.1)
Sodium: 141 mmol/L (ref 135–145)
Total Bilirubin: 0.8 mg/dL (ref 0.3–1.2)
Total Protein: 6.5 g/dL (ref 6.5–8.1)

## 2018-02-09 MED ORDER — DEXAMETHASONE SODIUM PHOSPHATE 10 MG/ML IJ SOLN
10.0000 mg | Freq: Once | INTRAMUSCULAR | Status: AC
  Administered 2018-02-09: 10 mg via INTRAVENOUS

## 2018-02-09 MED ORDER — HEPARIN SOD (PORK) LOCK FLUSH 100 UNIT/ML IV SOLN
500.0000 [IU] | Freq: Once | INTRAVENOUS | Status: AC | PRN
  Administered 2018-02-09: 500 [IU]
  Filled 2018-02-09: qty 5

## 2018-02-09 MED ORDER — SODIUM CHLORIDE 0.9 % IV SOLN
Freq: Once | INTRAVENOUS | Status: AC
  Administered 2018-02-09: 10:00:00 via INTRAVENOUS
  Filled 2018-02-09: qty 250

## 2018-02-09 MED ORDER — SODIUM CHLORIDE 0.9% FLUSH
10.0000 mL | INTRAVENOUS | Status: DC | PRN
  Administered 2018-02-09: 10 mL
  Filled 2018-02-09: qty 10

## 2018-02-09 MED ORDER — SODIUM CHLORIDE 0.9% FLUSH
10.0000 mL | Freq: Once | INTRAVENOUS | Status: AC
  Administered 2018-02-09: 10 mL
  Filled 2018-02-09: qty 10

## 2018-02-09 MED ORDER — DEXAMETHASONE SODIUM PHOSPHATE 10 MG/ML IJ SOLN
INTRAMUSCULAR | Status: AC
  Filled 2018-02-09: qty 1

## 2018-02-09 MED ORDER — SODIUM CHLORIDE 0.9 % IV SOLN
60.0000 mg/m2 | Freq: Once | INTRAVENOUS | Status: AC
  Administered 2018-02-09: 140 mg via INTRAVENOUS
  Filled 2018-02-09: qty 14

## 2018-02-09 NOTE — Telephone Encounter (Signed)
Patient requested an earlier inj appt 8/19

## 2018-02-09 NOTE — Patient Instructions (Signed)
[facility] Discharge Instructions for Patients Receiving Chemotherapy  Today you received the following chemotherapy agents: Taxotere  To help prevent nausea and vomiting after your treatment, we encourage you to take your nausea medication as directed.    If you develop nausea and vomiting that is not controlled by your nausea medication, call the clinic.   BELOW ARE SYMPTOMS THAT SHOULD BE REPORTED IMMEDIATELY:  *FEVER GREATER THAN 100.5 F  *CHILLS WITH OR WITHOUT FEVER  NAUSEA AND VOMITING THAT IS NOT CONTROLLED WITH YOUR NAUSEA MEDICATION  *UNUSUAL SHORTNESS OF BREATH  *UNUSUAL BRUISING OR BLEEDING  TENDERNESS IN MOUTH AND THROAT WITH OR WITHOUT PRESENCE OF ULCERS  *URINARY PROBLEMS  *BOWEL PROBLEMS  UNUSUAL RASH Items with * indicate a potential emergency and should be followed up as soon as possible.  Feel free to call the clinic should you have any questions or concerns. The clinic phone number is [phone].  Please show the CHEMO ALERT CARD at check-in to the Emergency Department and triage nurse.   

## 2018-02-09 NOTE — Progress Notes (Signed)
Hematology and Oncology Follow Up Visit  [name] [id number] 12/07/1960 57 y.o. 02/09/2018 9:10 AM   Principle Diagnosis: 57 year old man with prostate cancer diagnosed in 2012 with a Gleason score 4+5 = 9.  He developed castration resistant disease to the bone in 2016.   Prior Therapy:  He is S/P radical prostatectomy on 04/18/2011 under the care of Dr. Alinda [name]. The final pathological staging was T3b N1 with 9 positive lymph nodes out of 11 examined.   He continued to have persistent PSA and was treated with androgen deprivation. His PSA became undetectable until August 2014.    In June 2016 PSA went up to 2.0 and subsequently to 5.6 in November 2016. Staging workup including a bone scan done on 05/15/2015 which showed a focus of increased uptake in the posterior parietal occipital region of the skull.   Xtandi started on 06/10/2015.  Therapy discontinued in September 2018 because of progression of disease.  Zytiga 1000 mg daily with prednisone at 5 mg daily started in October 2018.  Therapy discontinued in January 2019 because of poor response.  Current therapy:   Androgen deprivation utilizing Lupron 30 mg every 4 months.    Xgeva given every 6 weeks.  Taxotere chemotherapy started on 08/04/2017. He received a 75 mg/m on cycle 1.  He is receiving subsequent cycles at 60 mg/m.  He is here for cycle 10 of therapy.  Interim History: [name] is here for a follow-up visit.  Since the last visit, he received the last cycle of chemotherapy and has reported few complications.  He reported worsening arthralgias and myalgias as well as nausea and poor appetite.  He of lost more than 10 pounds first 2 weeks gained most of it back in the last week.  He attributes a lot of the side effects associated with [place] which he received cycle of chemotherapy.  A lot of the symptoms has improved and has regained all activities of daily living.  He denied any infusion related complications of  peripheral neuropathy.  He does not report any blurry vision, syncope or seizures.  He denies any dizziness or confusion.  Does not report any fevers, chills, sweats.  Does not report any chest pain, palpitation, orthopnea or leg edema. Does not report any cough, hemoptysis or wheezing.  Does not report any nausea, vomiting, abdominal pain.  He denies any constipation or diarrhea.  Does not report any hematochezia, melena.  Does not report frequency urgency or hesitancy.  He does not report any rash or petechiae.  He does not report any clotting tendencies.  He denies any anxiety or depression.  Remaining review of systems is negative.  Medications: I have reviewed the patient's current medications.  Current Outpatient Medications  Medication Sig Dispense Refill  . calcium-vitamin D (CALCIUM 500+D) 500-200 MG-UNIT per tablet Take 2 tablets by mouth daily.    Marland Kitchen HYDROcodone-acetaminophen (NORCO) 5-325 MG tablet 1/2 to 1 Q 4 hours prn pain (Patient not taking: Reported on 12/29/2017) 30 tablet 0  . leuprolide (LUPRON) 22.5 MG injection Inject 22.5 mg into the muscle every 4 (four) months.     . lidocaine-prilocaine (EMLA) cream Apply 1 application topically as needed. (Patient taking differently: Apply 1 application topically as needed. ) 30 g 0  . losartan-hydrochlorothiazide (HYZAAR) 100-12.5 MG tablet Take 1 tablet by mouth daily.    . predniSONE (DELTASONE) 5 MG tablet TAKE 1 TABLET BY MOUTH EVERY DAY WITH BREAKFAST 30 tablet 0  . prochlorperazine (COMPAZINE) 10 MG  tablet Take 1 tablet (10 mg total) by mouth every 6 (six) hours as needed for nausea or vomiting. (Patient not taking: Reported on 12/29/2017) 30 tablet 0   No current facility-administered medications for this visit.      Allergies:  Allergies  Allergen Reactions  . Lanolin Itching and Rash    Past Medical History, Surgical history, Social history updated today without any changes.   Physical Exam:   Blood pressure 124/70,  pulse 66, temperature 98.4 F (36.9 C), temperature source Oral, resp. rate 18, height 5\' 9"  (1.753 m), weight 252 lb 14.4 oz (114.7 kg), SpO2 100 %.    ECOG: 0 General appearance: Alert, awake gentleman without distress. Head: Normocephalic without abnormalities. Oropharynx: Mucous membranes are moist and pink. Eyes: Sclera anicteric. Lymph nodes: No cervical, axillary, inguinal or supraclavicular lymphadenopathy. Heart: Regular rate and rhythm without any murmurs or gallops.  S1 and S2 without leg edema. Lung: Clear in all lung fields without any wheezes or dullness to percussion. Abdomin: Soft, nontender without any rebound or guarding. Skin: No skin rashes or lesions. Musculoskeletal: No joint deformity or effusion. Neurological: No motor or sensory deficits.    Lab Results: Lab Results  Component Value Date   WBC 8.1 02/09/2018   HGB 12.0 (L) 02/09/2018   HCT 36.9 (L) 02/09/2018   MCV 92.7 02/09/2018   PLT 263 02/09/2018     Chemistry      Component Value Date/Time   NA 142 01/19/2018 0747   NA 144 06/08/2017 1423   K 3.3 (L) 01/19/2018 0747   K 3.4 (L) 06/08/2017 1423   CL 105 01/19/2018 0747   CO2 27 01/19/2018 0747   CO2 24 06/08/2017 1423   BUN 18 01/19/2018 0747   BUN 16.1 06/08/2017 1423   CREATININE 1.16 01/19/2018 0747   CREATININE 1.0 06/08/2017 1423      Component Value Date/Time   CALCIUM 9.5 01/19/2018 0747   CALCIUM 9.6 06/08/2017 1423   ALKPHOS 69 01/19/2018 0747   ALKPHOS 128 06/08/2017 1423   AST 12 (L) 01/19/2018 0747   AST 17 06/08/2017 1423   ALT 28 01/19/2018 0747   ALT 12 06/08/2017 1423   BILITOT 0.5 01/19/2018 0747   BILITOT 0.74 06/08/2017 1423         Results for [name], Curtis JUARBE (MRN [id number]) as of 02/09/2018 07:30  Ref. Range 12/29/2017 09:40 01/19/2018 07:47  Prostate Specific Ag, Serum Latest Ref Range: 0.0 - 4.0 ng/mL 71.0 (H) 84.3 (H)     Impression and Plan:  57 year old man with:   1.  Prostate cancer with disease  to the bone and developed castration-resistant disease in 2016.  He is status post 9 cycles of Taxotere chemotherapy without major complications.  He did have some issues with the last cycle of treatment he is attributing to [place].  Risks and benefits of proceeding with cycle 10 of chemotherapy was reviewed today and is agreeable to proceed.  The plan is to complete 10 cycles of therapy and repeat imaging studies which is scheduled before the next cycle of therapy.    2. Androgen depravation: He will continue Lupron every 4 months indefinitely.  His next injection will be in October 2019.   3. Bone directed therapy: He had multiple issues related to Xgeva injection predominantly arthralgias or myalgias and GI toxicity.  I given the option of discontinuing of Xgeva or switching to Zometa in the future.  We will continue to address this issue with him moving  forward.  His next injection is not scheduled for September and will make a decision prior to that.  4.  IV access: Port-A-Cath has been used for chemotherapy without any issues.  5.  Neutropenia prophylaxis: He will continue to receive growth factor support after each cycle of therapy.  7.  Nausea prophylaxis: He has adequate antiemetics.  No issues reported in the last week or so.  8.  Goals of care: Treatment is palliative but his performance status remains excellent and aggressive therapy is warranted.  9.  Work-related considerations: He continues to be debilitated and and able to work at this time.    10. Follow-up: Will be in 3 weeks for cycle 11.  25  minutes was spent with the patient face-to-face today.  More than 50% of time was dedicated to discussing the natural course of this disease, complication associated with therapy as well as future treatment plan.  Zola Button, MD 8/16/20199:10 AM

## 2018-02-10 LAB — PROSTATE-SPECIFIC AG, SERUM (LABCORP): PROSTATE SPECIFIC AG, SERUM: 92.9 ng/mL — AB (ref 0.0–4.0)

## 2018-02-12 ENCOUNTER — Inpatient Hospital Stay: Payer: 59

## 2018-02-12 ENCOUNTER — Telehealth: Payer: Self-pay

## 2018-02-12 VITALS — BP 128/78 | HR 96 | Temp 98.9°F | Resp 16

## 2018-02-12 DIAGNOSIS — C61 Malignant neoplasm of prostate: Secondary | ICD-10-CM

## 2018-02-12 DIAGNOSIS — Z5111 Encounter for antineoplastic chemotherapy: Secondary | ICD-10-CM | POA: Diagnosis not present

## 2018-02-12 MED ORDER — PEGFILGRASTIM-CBQV 6 MG/0.6ML ~~LOC~~ SOSY
PREFILLED_SYRINGE | SUBCUTANEOUS | Status: AC
  Filled 2018-02-12: qty 0.6

## 2018-02-12 MED ORDER — PEGFILGRASTIM-CBQV 6 MG/0.6ML ~~LOC~~ SOSY
6.0000 mg | PREFILLED_SYRINGE | Freq: Once | SUBCUTANEOUS | Status: AC
  Administered 2018-02-12: 6 mg via SUBCUTANEOUS

## 2018-02-12 NOTE — Patient Instructions (Signed)
Pegfilgrastim injection What is this medicine? PEGFILGRASTIM (PEG fil gra stim) is a long-acting granulocyte colony-stimulating factor that stimulates the growth of neutrophils, a type of white blood cell important in the body's fight against infection. It is used to reduce the incidence of fever and infection in patients with certain types of cancer who are receiving chemotherapy that affects the bone marrow, and to increase survival after being exposed to high doses of radiation. This medicine may be used for other purposes; ask your health care provider or pharmacist if you have questions. COMMON BRAND NAME(S): Neulasta What should I tell my health care provider before I take this medicine? They need to know if you have any of these conditions: -kidney disease -latex allergy -ongoing radiation therapy -sickle cell disease -skin reactions to acrylic adhesives (On-Body Injector only) -an unusual or allergic reaction to pegfilgrastim, filgrastim, other medicines, foods, dyes, or preservatives -pregnant or trying to get pregnant -breast-feeding How should I use this medicine? This medicine is for injection under the skin. If you get this medicine at home, you will be taught how to prepare and give the pre-filled syringe or how to use the On-body Injector. Refer to the patient Instructions for Use for detailed instructions. Use exactly as directed. Tell your healthcare provider immediately if you suspect that the On-body Injector may not have performed as intended or if you suspect the use of the On-body Injector resulted in a missed or partial dose. It is important that you put your used needles and syringes in a special sharps container. Do not put them in a trash can. If you do not have a sharps container, call your pharmacist or healthcare provider to get one. Talk to your pediatrician regarding the use of this medicine in children. While this drug may be prescribed for selected conditions,  precautions do apply. Overdosage: If you think you have taken too much of this medicine contact a poison control center or emergency room at once. NOTE: This medicine is only for you. Do not share this medicine with others. What if I miss a dose? It is important not to miss your dose. Call your doctor or health care professional if you miss your dose. If you miss a dose due to an On-body Injector failure or leakage, a new dose should be administered as soon as possible using a single prefilled syringe for manual use. What may interact with this medicine? Interactions have not been studied. Give your health care provider a list of all the medicines, herbs, non-prescription drugs, or dietary supplements you use. Also tell them if you smoke, drink alcohol, or use illegal drugs. Some items may interact with your medicine. This list may not describe all possible interactions. Give your health care provider a list of all the medicines, herbs, non-prescription drugs, or dietary supplements you use. Also tell them if you smoke, drink alcohol, or use illegal drugs. Some items may interact with your medicine. What should I watch for while using this medicine? You may need blood work done while you are taking this medicine. If you are going to need a MRI, CT scan, or other procedure, tell your doctor that you are using this medicine (On-Body Injector only). What side effects may I notice from receiving this medicine? Side effects that you should report to your doctor or health care professional as soon as possible: -allergic reactions like skin rash, itching or hives, swelling of the face, lips, or tongue -dizziness -fever -pain, redness, or irritation at site   where injected -pinpoint red spots on the skin -red or dark-brown urine -shortness of breath or breathing problems -stomach or side pain, or pain at the shoulder -swelling -tiredness -trouble passing urine or change in the amount of urine Side  effects that usually do not require medical attention (report to your doctor or health care professional if they continue or are bothersome): -bone pain -muscle pain This list may not describe all possible side effects. Call your doctor for medical advice about side effects. You may report side effects to FDA at 1-800-FDA-1088. Where should I keep my medicine? Keep out of the reach of children. Store pre-filled syringes in a refrigerator between 2 and 8 degrees C (36 and 46 degrees F). Do not freeze. Keep in carton to protect from light. Throw away this medicine if it is left out of the refrigerator for more than 48 hours. Throw away any unused medicine after the expiration date. NOTE: This sheet is a summary. It may not cover all possible information. If you have questions about this medicine, talk to your doctor, pharmacist, or health care provider.  2018 [employer]vier/Gold Standard (2016-06-09 12:58:03)  

## 2018-02-12 NOTE — Telephone Encounter (Signed)
Spoke with patient spouse Vaughan Basta and informed of the increased PSA of 92.9 and no change in the plan. Vaughan Basta stated that patient is unavailable but she will make him aware of the level. She had no other questions or concerns.

## 2018-02-26 ENCOUNTER — Other Ambulatory Visit: Payer: Self-pay | Admitting: Oncology

## 2018-02-26 DIAGNOSIS — C61 Malignant neoplasm of prostate: Secondary | ICD-10-CM

## 2018-02-27 ENCOUNTER — Telehealth: Payer: Self-pay

## 2018-02-27 NOTE — Telephone Encounter (Signed)
Returned call to patient spouse and made aware to keep all appointments as scheduled on 9/6.

## 2018-02-27 NOTE — Telephone Encounter (Signed)
-----   Message from [name], MD sent at 02/27/2018  1:46 PM EDT ----- Regarding: RE: Currently in the hospital Will keep the appointment as is for the time being.  Thanks.  ----- Message ----- From: Scot Dock, RN Sent: 02/27/2018   1:35 PM EDT To: [name], MD Subject: Currently in the hospital                      Patient spouse stated that patient was admitted to [facility] on Saturday for sepsis of unknown origin. Currently treated for UTI, IV abx, fluids for dehydration. C-Diff  neg. Moving to step-down unit today and poss DC tomorrow. Spouse cancelled CT scan scheduled for tomorrow and will reschedule. Patient has appointment this Fri for labs, you, and infusion. Please advise on any needed change to the appointments pending hospital DC. Thanks

## 2018-02-28 ENCOUNTER — Ambulatory Visit (HOSPITAL_COMMUNITY): Payer: 59

## 2018-03-02 ENCOUNTER — Telehealth: Payer: Self-pay | Admitting: Oncology

## 2018-03-02 ENCOUNTER — Inpatient Hospital Stay: Payer: 59 | Attending: Oncology | Admitting: Oncology

## 2018-03-02 ENCOUNTER — Inpatient Hospital Stay: Payer: 59

## 2018-03-02 VITALS — BP 126/81 | HR 95 | Temp 97.7°F | Resp 19 | Ht 69.0 in | Wt 254.9 lb

## 2018-03-02 DIAGNOSIS — C7951 Secondary malignant neoplasm of bone: Secondary | ICD-10-CM | POA: Diagnosis not present

## 2018-03-02 DIAGNOSIS — Z95828 Presence of other vascular implants and grafts: Secondary | ICD-10-CM

## 2018-03-02 DIAGNOSIS — C61 Malignant neoplasm of prostate: Secondary | ICD-10-CM

## 2018-03-02 DIAGNOSIS — Z9221 Personal history of antineoplastic chemotherapy: Secondary | ICD-10-CM | POA: Insufficient documentation

## 2018-03-02 DIAGNOSIS — Z79818 Long term (current) use of other agents affecting estrogen receptors and estrogen levels: Secondary | ICD-10-CM | POA: Insufficient documentation

## 2018-03-02 LAB — CBC WITH DIFFERENTIAL (CANCER CENTER ONLY)
BASOS ABS: 0.1 10*3/uL (ref 0.0–0.1)
BASOS PCT: 1 %
EOS ABS: 0 10*3/uL (ref 0.0–0.5)
EOS PCT: 0 %
HCT: 31 % — ABNORMAL LOW (ref 38.4–49.9)
HEMOGLOBIN: 10.4 g/dL — AB (ref 13.0–17.1)
LYMPHS ABS: 0.8 10*3/uL — AB (ref 0.9–3.3)
Lymphocytes Relative: 14 %
MCH: 29.7 pg (ref 27.2–33.4)
MCHC: 33.5 g/dL (ref 32.0–36.0)
MCV: 88.5 fL (ref 79.3–98.0)
Monocytes Absolute: 0.5 10*3/uL (ref 0.1–0.9)
Monocytes Relative: 8 %
NEUTROS PCT: 77 %
Neutro Abs: 4.6 10*3/uL (ref 1.5–6.5)
PLATELETS: 327 10*3/uL (ref 140–400)
RBC: 3.51 MIL/uL — ABNORMAL LOW (ref 4.20–5.82)
RDW: 15.9 % — ABNORMAL HIGH (ref 11.0–14.6)
WBC: 6 10*3/uL (ref 4.0–10.3)

## 2018-03-02 LAB — CMP (CANCER CENTER ONLY)
ALT: 41 U/L (ref 0–44)
AST: 45 U/L — ABNORMAL HIGH (ref 15–41)
Albumin: 2.7 g/dL — ABNORMAL LOW (ref 3.5–5.0)
Alkaline Phosphatase: 88 U/L (ref 38–126)
Anion gap: 10 (ref 5–15)
BUN: 10 mg/dL (ref 6–20)
CHLORIDE: 111 mmol/L (ref 98–111)
CO2: 25 mmol/L (ref 22–32)
CREATININE: 1.26 mg/dL — AB (ref 0.61–1.24)
Calcium: 7.7 mg/dL — ABNORMAL LOW (ref 8.9–10.3)
GFR, Est AFR Am: >60 mL/min (ref >60)
Glucose, Bld: 104 mg/dL — ABNORMAL HIGH (ref 70–99)
POTASSIUM: 3.3 mmol/L — AB (ref 3.5–5.1)
Sodium: 146 mmol/L — ABNORMAL HIGH (ref 135–145)
Total Bilirubin: 0.6 mg/dL (ref 0.3–1.2)
Total Protein: 5.8 g/dL — ABNORMAL LOW (ref 6.5–8.1)

## 2018-03-02 MED ORDER — SODIUM CHLORIDE 0.9% FLUSH
10.0000 mL | Freq: Once | INTRAVENOUS | Status: AC
  Administered 2018-03-02: 10 mL
  Filled 2018-03-02: qty 10

## 2018-03-02 MED ORDER — HEPARIN SOD (PORK) LOCK FLUSH 100 UNIT/ML IV SOLN
500.0000 [IU] | Freq: Once | INTRAVENOUS | Status: AC
  Administered 2018-03-02: 500 [IU]
  Filled 2018-03-02: qty 5

## 2018-03-02 NOTE — Progress Notes (Signed)
Hematology and Oncology Follow Up Visit  [name] [id number] 1961-02-23 57 y.o. 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  He was initially diagnosed in 2012 with a Gleason score 4+5 = 9.     Prior Therapy:  He is S/P radical prostatectomy on 04/18/2011 under the care of Dr. Alinda Money. The final pathological staging was T3b N1 with 9 positive lymph nodes out of 11 examined.   He continued to have persistent PSA and was treated with androgen deprivation. His PSA became undetectable until August 2014.    In June 2016 PSA went up to 2.0 and subsequently to 5.6 in November 2016. Staging workup including a bone scan done on 05/15/2015 which showed a focus of increased uptake in the posterior parietal occipital region of the skull.   Xtandi started on 06/10/2015.  Therapy discontinued in September 2018 because of progression of disease.  Zytiga 1000 mg daily with prednisone at 5 mg daily started in October 2018.  Therapy discontinued in January 2019 because of poor response.  Taxotere chemotherapy started on 08/04/2017. He received a 75 mg/m on cycle 1.  He is receiving subsequent cycles at 60 mg/m.  He is status post 10 cycles of therapy concluded in August 2019.  Current therapy:   Androgen deprivation utilizing Lupron 30 mg every 4 months.    Xgeva given every 6 weeks.    Interim History: [name] presents today for a follow-up with his wife.  Since last visit, he was hospitalized briefly in [place] after presenting with symptoms of fevers and urinary tract infection.  He was started on levofloxacin and completing a course of antibiotic at this time.  He also developed urinary retention and currently on Flomax although he still has an indwelling Foley catheter.  He is no longer reporting any fevers but still quite fatigued and debilitated.  His appetite is  improving although still rather slow.  He denies any complications related to last cycle of chemotherapy although he does report some mild arthralgias or myalgias.  He does not report any blurry vision, syncope or seizures.  He denies any alteration in mental status or lethargy.  Does not report any fevers, chills, sweats.  Does not report any chest pain, palpitation, orthopnea or leg edema. Does not report any cough, hemoptysis or wheezing.  Does not report any nausea, vomiting, or change in his bowel habits.  He denies any early satiety.  Does not report frequency urgency or hesitancy.  He does not report any rash or petechiae.  He does not report any skin rashes or lesions.  He denies any changes in his mood.  Remaining review of systems is negative.  Medications: I have reviewed the patient's current medications.  Current Outpatient Medications  Medication Sig Dispense Refill  . calcium-vitamin D (CALCIUM 500+D) 500-200 MG-UNIT per tablet Take 2 tablets by mouth daily.    Marland Kitchen HYDROcodone-acetaminophen (NORCO) 5-325 MG tablet 1/2 to 1 Q 4 hours prn pain 30 tablet 0  . leuprolide (LUPRON) 22.5 MG injection Inject 22.5 mg into the muscle every 4 (four) months.     . lidocaine-prilocaine (EMLA) cream Apply 1 application topically as needed. (Patient taking differently: Apply 1 application topically as needed. ) 30 g 0  . losartan-hydrochlorothiazide (HYZAAR) 100-12.5 MG tablet Take 1 tablet by mouth daily.    . predniSONE (DELTASONE) 5 MG tablet TAKE 1 TABLET BY MOUTH EVERY DAY  WITH BREAKFAST 30 tablet 0  . prochlorperazine (COMPAZINE) 10 MG tablet Take 1 tablet (10 mg total) by mouth every 6 (six) hours as needed for nausea or vomiting. 30 tablet 0   Current Facility-Administered Medications  Medication Dose Route Frequency Provider Last Rate Last Dose  . heparin lock flush 100 unit/mL  500 Units Intracatheter Once Curtis Portela, MD      . sodium chloride flush (NS) 0.9 % injection 10 mL  10 mL  Intracatheter Once Curtis Portela, MD         Allergies:  Allergies  Allergen Reactions  . Lanolin Itching and Rash    Past Medical History, Surgical history, Social history updated today without any changes.   Physical Exam:   Blood pressure 126/81, pulse 95, temperature 97.7 F (36.5 C), temperature source Oral, resp. rate 19, height 5\' 9"  (1.753 m), weight 254 lb 14.4 oz (115.6 kg), SpO2 100 %.    ECOG: 1   General appearance: Comfortable appearing without any discomfort Head: Normocephalic without any trauma Oropharynx: Mucous membranes are moist and pink without any thrush or ulcers. Eyes: Pupils are equal and round reactive to light. Lymph nodes: No cervical, supraclavicular, inguinal or axillary lymphadenopathy.   Heart:regular rate and rhythm.  S1 and S2 without leg edema. Lung: Clear without any rhonchi or wheezes.  No dullness to percussion. Abdomin: Soft, nontender, nondistended with good bowel sounds.  No hepatosplenomegaly. Musculoskeletal: No joint deformity or effusion.  Full range of motion noted. Neurological: No deficits noted on motor, sensory and deep tendon reflex exam. Skin: No petechial rash or dryness.  Appeared moist.  Psychiatric: Mood and affect appeared appropriate.      Lab Results: Lab Results  Component Value Date   WBC 6.0 03/02/2018   HGB 10.4 (L) 03/02/2018   HCT 31.0 (L) 03/02/2018   MCV 88.5 03/02/2018   PLT 327 03/02/2018     Chemistry      Component Value Date/Time   NA 141 02/09/2018 0845   NA 144 06/08/2017 1423   K 3.6 02/09/2018 0845   K 3.4 (L) 06/08/2017 1423   CL 107 02/09/2018 0845   CO2 25 02/09/2018 0845   CO2 24 06/08/2017 1423   BUN 17 02/09/2018 0845   BUN 16.1 06/08/2017 1423   CREATININE 1.21 02/09/2018 0845   CREATININE 1.0 06/08/2017 1423      Component Value Date/Time   CALCIUM 8.9 02/09/2018 0845   CALCIUM 9.6 06/08/2017 1423   ALKPHOS 59 02/09/2018 0845   ALKPHOS 128 06/08/2017 1423   AST  13 (L) 02/09/2018 0845   AST 17 06/08/2017 1423   ALT 26 02/09/2018 0845   ALT 12 06/08/2017 1423   BILITOT 0.8 02/09/2018 0845   BILITOT 0.74 06/08/2017 1423        Results for Toppins, [name] (MRN [id number]) as of 03/02/2018 08:59  Ref. Range 01/19/2018 07:47 02/09/2018 08:45  Prostate Specific Ag, Serum Latest Ref Range: 0.0 - 4.0 ng/mL 84.3 (H) 92.9 (H)       Impression and Plan:  57 year old man with:   1.  Castration-resistant prostate cancer with disease to the bone documented in 2016.  He completed 10 cycles of Taxotere chemotherapy with few complications.  His PSA has been relatively stable although slowly rising.  The plan is to repeat imaging studies in the near future for staging purposes and will determine the next step of action.  That will be in the form of restarting chemotherapy in  the form of Jevtana versus alternative therapy or clinical trials.    2. Androgen depravation: He continues to be on Lupron which will be continued indefinitely.  Next injection will be in April 2019.   3. Bone directed therapy: He has been on Xgeva in the past which will be on hold for the time being based on his wishes.  4.  IV access: Port-A-Cath will be flushed periodically while of chemotherapy.  5.  Goals of care: He has an incurable malignancy but disease that can be palliated given his excellent performance status.  Aggressive therapy is warranted.  6. Follow-up: We will be in 4 weeks to follow his clinical status and assess the need for subsequent treatment.  15  minutes was spent with the patient face-to-face today.  More than 50% of time was dedicated to reviewing his disease course, treatment options and coordinating his plan of care.  Zola Button, MD 9/6/20198:55 AM

## 2018-03-02 NOTE — Telephone Encounter (Signed)
Scheduled appt per 9/6 los - gave patient AVS and calender per los.   

## 2018-03-03 LAB — PROSTATE-SPECIFIC AG, SERUM (LABCORP): PROSTATE SPECIFIC AG, SERUM: 88.4 ng/mL — AB (ref 0.0–4.0)

## 2018-03-05 ENCOUNTER — Telehealth: Payer: Self-pay | Admitting: *Deleted

## 2018-03-05 ENCOUNTER — Ambulatory Visit: Payer: 59

## 2018-03-05 NOTE — Telephone Encounter (Signed)
As noted below by Dr. Alen Blew, I informed Vaughan Basta of his PSA result. She verbalized understanding.

## 2018-03-05 NOTE — Telephone Encounter (Signed)
-----   Message from [name], MD sent at 03/05/2018  7:39 AM EDT ----- Please let him know his PSA is down some.

## 2018-03-07 ENCOUNTER — Ambulatory Visit (HOSPITAL_COMMUNITY)
Admission: RE | Admit: 2018-03-07 | Discharge: 2018-03-07 | Disposition: A | Discharge status: Home / Self Care | Payer: 59 | Source: Ambulatory Visit | Attending: Oncology | Admitting: Oncology

## 2018-03-07 ENCOUNTER — Other Ambulatory Visit: Payer: Self-pay | Admitting: Oncology

## 2018-03-07 ENCOUNTER — Encounter (HOSPITAL_COMMUNITY): Payer: Self-pay

## 2018-03-07 DIAGNOSIS — C7951 Secondary malignant neoplasm of bone: Secondary | ICD-10-CM | POA: Diagnosis not present

## 2018-03-07 DIAGNOSIS — C61 Malignant neoplasm of prostate: Secondary | ICD-10-CM | POA: Insufficient documentation

## 2018-03-07 DIAGNOSIS — Z9079 Acquired absence of other genital organ(s): Secondary | ICD-10-CM | POA: Diagnosis not present

## 2018-03-07 HISTORY — DX: Secondary malignant neoplasm of bone: C79.51

## 2018-03-07 MED ORDER — HEPARIN SOD (PORK) LOCK FLUSH 100 UNIT/ML IV SOLN
INTRAVENOUS | Status: AC
  Filled 2018-03-07: qty 5

## 2018-03-07 MED ORDER — HEPARIN SOD (PORK) LOCK FLUSH 100 UNIT/ML IV SOLN
500.0000 [IU] | Freq: Once | INTRAVENOUS | Status: AC
  Administered 2018-03-07: 500 [IU] via INTRAVENOUS

## 2018-03-07 MED ORDER — IOHEXOL 300 MG/ML  SOLN
100.0000 mL | Freq: Once | INTRAMUSCULAR | Status: AC | PRN
  Administered 2018-03-07: 100 mL via INTRAVENOUS

## 2018-03-07 NOTE — Progress Notes (Signed)
The results of the CT scan was discussed with the patient's wife over the phone today.  His disease continue to be predominantly in the bone without any visceral or lymph node involvement.  Based on these findings, I have recommended consideration for Xofigo given his symptomatic bone disease and lack of visceral involvement.  I will make the appropriate arrangements for him for radiation oncology consult to discuss a fecal in the near future.

## 2018-03-08 ENCOUNTER — Encounter: Payer: Self-pay | Admitting: Radiation Oncology

## 2018-03-27 NOTE — Progress Notes (Signed)
Histology and Location of Primary Cancer: castration-resistant prostate cancer diagnosed in 2012 with a Gleason score 4+5 = 9.    Sites of Visceral and Bony Metastatic Disease: metastatic disease to the bone diagnosed in 2016.  Location(s) of Symptomatic Metastases: Bones. Evaluate for Xofigo.  Past/Anticipated chemotherapy by medical oncology, if any:  Prior Therapy:  He is S/P radical prostatectomy on 04/18/2011 under the care of Dr. Alinda Money. The final pathological staging was T3b N1 with 9 positive lymph nodes out of 11 examined.   He continued to have persistent PSA and was treated with androgen deprivation. His PSA became undetectable until August 2014.    In June 2016 PSA went up to 2.0 and subsequently to 5.6 in November 2016. Staging workup including a bone scan done on 05/15/2015 which showed a focus of increased uptake in the posterior parietal occipital region of the skull.   Xtandi started on 06/10/2015.  Therapy discontinued in September 2018 because of progression of disease.  Zytiga 1000 mg daily with prednisone at 5 mg daily started in October 2018.  Therapy discontinued in January 2019 because of poor response.  Taxotere chemotherapy started on 08/04/2017. He received a 75 mg/m on cycle 1.  He is receiving subsequent cycles at 60 mg/m.  He is status post 10 cycles of therapy concluded in August 2019.  Current therapy:   Androgen deprivation utilizing Lupron 30 mg every 4 months.  Next injection due Friday, October 4th.  Xgeva given every 6 weeks.  Pain on a scale of 0-10 is: Denies   If Spine Met(s), symptoms, if any, include:  Bowel/Bladder retention or incontinence (please describe): Bladder incontinence s/p prostatectomy. Denies bowel incontinence. IPSS 5. SHIM 1.  Numbness or weakness in extremities (please describe): Reports generalized weakness. Denies numbness of extremities.  Current Decadron regimen, if applicable: Prednisone 5 mg  daily  Ambulatory status? Walker? Wheelchair?: Ambulatory  SAFETY ISSUES:  Prior radiation? no  Pacemaker/ICD? no  Possible current pregnancy? no  Is the patient on methotrexate? No  Patient reports having a power port.  Current Complaints / other details:  57 year old male. Married.   Per Shadad's note on 03/07/2018: Disease continue to be predominantly in the bone without any visceral or lymph node involvement.  Based on these findings, I have recommended consideration for Xofigo given his symptomatic bone disease and lack of visceral involvement.  I will make the appropriate arrangements for him for radiation oncology consult to discuss a fecal in the near future.

## 2018-03-28 ENCOUNTER — Encounter: Payer: Self-pay | Admitting: Radiation Oncology

## 2018-03-28 ENCOUNTER — Ambulatory Visit
Admission: RE | Admit: 2018-03-28 | Discharge: 2018-03-28 | Disposition: A | Discharge status: Home / Self Care | Payer: 59 | Source: Ambulatory Visit | Attending: Radiation Oncology | Admitting: Radiation Oncology

## 2018-03-28 ENCOUNTER — Other Ambulatory Visit: Payer: Self-pay

## 2018-03-28 VITALS — BP 113/93 | HR 83 | Temp 98.6°F | Resp 16 | Wt 243.0 lb

## 2018-03-28 DIAGNOSIS — C7951 Secondary malignant neoplasm of bone: Secondary | ICD-10-CM | POA: Insufficient documentation

## 2018-03-28 DIAGNOSIS — I10 Essential (primary) hypertension: Secondary | ICD-10-CM | POA: Insufficient documentation

## 2018-03-28 DIAGNOSIS — Z87891 Personal history of nicotine dependence: Secondary | ICD-10-CM | POA: Diagnosis not present

## 2018-03-28 DIAGNOSIS — C7952 Secondary malignant neoplasm of bone marrow: Principal | ICD-10-CM

## 2018-03-28 DIAGNOSIS — C61 Malignant neoplasm of prostate: Secondary | ICD-10-CM | POA: Insufficient documentation

## 2018-03-28 HISTORY — DX: Malignant neoplasm of prostate: C61

## 2018-03-28 NOTE — Progress Notes (Signed)
See progress note under physician encounter. 

## 2018-03-28 NOTE — Progress Notes (Signed)
Radiation Oncology         [phone]) [phone] ________________________________  Initial outpatient Consultation  Name: [name] MRN: [id number]  Date: 03/28/2018  DOB: 19-Jul-1960  CC:Curtis Code, DO  Curtis Portela, MD   REFERRING PHYSICIAN: [name], MD  DIAGNOSIS:  57 y.o. 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    ICD-10-CM   1. Prostate cancer [facility]) C61     HISTORY OF PRESENT ILLNESS: [name] is a 57 y.o. male with a diagnosis of prostate cancer.  He has a history of Gleason 4+5 adenocarcinoma of the prostate diagnosed in 2012 with a PSA of 16.6.  He elected to proceed with robotic assisted laparoscopic prostatectomy under the care and guidance of Dr. Alinda Money on 04/18/2011.  Final pathology revealed [id number] disease with 9 of 11 lymph nodes positive as well as a focally positive margin.  His PSA remained detectable after surgery so he was started on androgen deprivation therapy with Lupron in December 2012.  Subsequently, the PSA became undetectable but began rising again in August 2014.  He had a bone scan and CT for disease restaging in October 2014 which were both negative for any evidence of metastatic disease.  PSA continued to gradually rise and reached 5.6 in November 2016 despite continued androgen deprivation therapy.  A repeat bone scan at that time showed increased uptake at the posterior parietal occipital region of the skull.  The patient was started on Xtandi on June 10, 2015 but this was discontinued due to progression in September 2018.  Therapy was changed to Zytiga with prednisone in October 2018 but discontinued in January 2019 due to poor response.  At that time, his therapy was changed to Taxotere chemotherapy which was started on August 04, 2017.  He completed 10 cycles, concluded in August 2019.  He underwent a CT-guided biopsy of a right iliac lesion on 10/18/2017 which was consistent  with metastatic carcinoma but unfortunately did not show any actionable/targetable mutations on further molecular testing.  His PSA was 84.3 on January 19, 2018 and 92.9 and August 2019.  He has continued ADT with Lupron every 4 months (next injection is due April 2020) and Xgeva every 6 weeks (currently on hold as of September 2019).  His most recent PSA on March 02, 2018 was 88.4.  Recent restaging studies with CT chest abdomen and pelvis on 03/07/2018 demonstrated multifocal sclerotic metastatic disease in the sternum, clavicles, multiple ribs, thoracic spine, lumbar spine, pelvis, and proximal femurs with mild progression as compared to CT from March 2019.  There was no pathologic lymphadenopathy or apparent visceral involvement.  The patient has kindly been referred today for discussion of potential radiation treatment options. He is accompanied by his wife today.   PREVIOUS RADIATION THERAPY: No  PAST MEDICAL HISTORY:  Past Medical History:  Diagnosis Date  . Hypertension   . Metastatic cancer to bone ([facility]) dx'd 2018  . Prostate cancer [facility]) 2012      PAST SURGICAL HISTORY: Past Surgical History:  Procedure Laterality Date  . IR FLUORO GUIDE PORT INSERTION RIGHT  07/28/2017  . IR US GUIDE VASC ACCESS RIGHT  07/28/2017    FAMILY HISTORY: History reviewed. No pertinent family history.  SOCIAL HISTORY:  Social History   Socioeconomic History  . Marital status: Married    Spouse name: Not on file  . Number of children: Not on file  . Years of education: Not on file  .  Highest education level: Not on file  Occupational History  . Not on file  Social Needs  . Financial resource strain: Not on file  . Food insecurity:    Worry: Not on file    Inability: Not on file  . Transportation needs:    Medical: Not on file    Non-medical: Not on file  Tobacco Use  . Smoking status: Former Research scientist (life sciences)  . Smokeless tobacco: Never Used  Substance and Sexual Activity  . Alcohol use: Not  Currently  . Drug use: Never  . Sexual activity: Not Currently  Lifestyle  . Physical activity:    Days per week: Not on file    Minutes per session: Not on file  . Stress: Not on file  Relationships  . Social connections:    Talks on phone: Not on file    Gets together: Not on file    Attends religious service: Not on file    Active member of club or organization: Not on file    Attends meetings of clubs or organizations: Not on file    Relationship status: Not on file  . Intimate partner violence:    Fear of current or ex partner: Not on file    Emotionally abused: Not on file    Physically abused: Not on file    Forced sexual activity: Not on file  Other Topics Concern  . Not on file  Social History Narrative  . Not on file    ALLERGIES: Lanolin  MEDICATIONS:  Current Outpatient Medications  Medication Sig Dispense Refill  . calcium-vitamin D (CALCIUM 500+D) 500-200 MG-UNIT per tablet Take 2 tablets by mouth daily.    Marland Kitchen leuprolide (LUPRON) 22.5 MG injection Inject 22.5 mg into the muscle every 4 (four) months.     . lidocaine-prilocaine (EMLA) cream Apply 1 application topically as needed. (Patient taking differently: Apply 1 application topically as needed. ) 30 g 0  . losartan-hydrochlorothiazide (HYZAAR) 100-12.5 MG tablet Take 1 tablet by mouth daily.    . predniSONE (DELTASONE) 5 MG tablet TAKE 1 TABLET BY MOUTH EVERY DAY WITH BREAKFAST 30 tablet 0  . HYDROcodone-acetaminophen (NORCO) 5-325 MG tablet 1/2 to 1 Q 4 hours prn pain (Patient not taking: Reported on 03/28/2018) 30 tablet 0  . prochlorperazine (COMPAZINE) 10 MG tablet Take 1 tablet (10 mg total) by mouth every 6 (six) hours as needed for nausea or vomiting. (Patient not taking: Reported on 03/28/2018) 30 tablet 0   No current facility-administered medications for this encounter.     REVIEW OF SYSTEMS:  On review of systems, the patient reports that he is doing well overall. He denies any chest pain,  shortness of breath, cough, fevers, chills, night sweats, unintended weight changes. He denies any bowel disturbances, and denies abdominal pain, nausea or vomiting. He denies any new musculoskeletal or joint aches or pains.  He does not have an site specific pain. A complete review of systems is obtained and is otherwise negative.    PHYSICAL EXAM:  Wt Readings from Last 3 Encounters:  03/28/18 243 lb (110.2 kg)  03/02/18 254 lb 14.4 oz (115.6 kg)  02/09/18 252 lb 14.4 oz (114.7 kg)   Temp Readings from Last 3 Encounters:  03/28/18 98.6 F (37 C)  03/02/18 97.7 F (36.5 C) (Oral)  02/12/18 98.9 F (37.2 C) (Oral)   BP Readings from Last 3 Encounters:  03/28/18 (!) 113/93  03/02/18 126/81  02/12/18 128/78   Pulse Readings from Last 3 Encounters:  03/28/18 83  03/02/18 95  02/12/18 96   Pain Assessment Pain Score: 0-No pain/10  In general this is a well appearing caucasian male in no acute distress.  He is alert and oriented x4 and appropriate throughout the examination. HEENT reveals that the patient is normocephalic, atraumatic. EOMs are intact. PERRLA. Skin is intact without any evidence of gross lesions. Cardiovascular exam reveals a regular rate and rhythm, no clicks rubs or murmurs are auscultated. Chest is clear to auscultation bilaterally. Lymphatic assessment is performed and does not reveal any adenopathy in the cervical, supraclavicular, axillary, or inguinal chains. Abdomen has active bowel sounds in all quadrants and is intact. The abdomen is soft, non tender, non distended. Lower extremities are negative for pretibial pitting edema, deep calf tenderness, cyanosis or clubbing.   KPS = 90  100 - Normal; no complaints; no evidence of disease. 90   - Able to carry on normal activity; minor signs or symptoms of disease. 80   - Normal activity with effort; some signs or symptoms of disease. 61   - Cares for self; unable to carry on normal activity or to do active  work. 60   - Requires occasional assistance, but is able to care for most of his personal needs. 50   - Requires considerable assistance and frequent medical care. 27   - Disabled; requires special care and assistance. 38   - Severely disabled; hospital admission is indicated although death not imminent. 36   - Very sick; hospital admission necessary; active supportive treatment necessary. 10   - Moribund; fatal processes progressing rapidly. 0     - Dead  Karnofsky DA, Abelmann [place], Craver LS and Burchenal [facility] [phone]) The use of the nitrogen mustards in the palliative treatment of carcinoma: with particular reference to bronchogenic carcinoma Cancer 1 634-56  LABORATORY DATA:  Lab Results  Component Value Date   WBC 6.0 03/02/2018   HGB 10.4 (L) 03/02/2018   HCT 31.0 (L) 03/02/2018   MCV 88.5 03/02/2018   PLT 327 03/02/2018   Lab Results  Component Value Date   NA 146 (H) 03/02/2018   K 3.3 (L) 03/02/2018   CL 111 03/02/2018   CO2 25 03/02/2018   Lab Results  Component Value Date   ALT 41 03/02/2018   AST 45 (H) 03/02/2018   ALKPHOS 88 03/02/2018   BILITOT 0.6 03/02/2018     RADIOGRAPHY: Ct Chest W Contrast  Result Date: 03/07/2018 CLINICAL DATA:  Metastatic prostate cancer, status post prostatectomy, chemotherapy and Lupron ongoing EXAM: CT CHEST, ABDOMEN, AND PELVIS WITH CONTRAST TECHNIQUE: Multidetector CT imaging of the chest, abdomen and pelvis was performed following the standard protocol during bolus administration of intravenous contrast. CONTRAST:  158m OMNIPAQUE IOHEXOL 300 MG/ML  SOLN COMPARISON:  CT abdomen/pelvis and whole-body bone scan dated 09/06/2017 FINDINGS: CT CHEST FINDINGS Cardiovascular: The heart is normal in size. No pericardial effusion. No evidence of thoracic aortic aneurysm. Atherosclerotic calcifications of the aortic arch. Three vessel coronary atherosclerosis. Right chest port terminates in the lower SVC. Mediastinum/Nodes: No suspicious  mediastinal, hilar, or axillary lymphadenopathy. Visualized thyroid is mildly heterogeneous/nodular. Lungs/Pleura: Mild linear scarring in the anterior right upper lobe. Mild linear scarring/atelectasis in the bilateral lower lobes. Mild dependent atelectasis in the bilateral lung bases. 3 mm triangular subpleural nodule in the anterior right middle lobe (series 5/image 92), unchanged, benign. No suspicious pulmonary nodules. No focal consolidation. No pleural effusion or pneumothorax. Musculoskeletal: Gynecomastia. Multifocal sclerotic osseous metastases involving the visualized sternum,  bilateral clavicles, multiple ribs, and thoracic spine. This appearance is similar to the prior. CT ABDOMEN PELVIS FINDINGS Hepatobiliary: Liver is within normal limits. Gallbladder is unremarkable. No intrahepatic or extrahepatic ductal dilatation. Pancreas: Within normal limits. Spleen: Within normal limits. Adrenals/Urinary Tract: Adrenal glands within normal limits. Right renal cysts, measuring up to 15 mm in the posterior right upper kidney (series 3/image 70), benign (Bosniak I). 9 mm left upper pole renal calculus (series 3/image 70). No hydronephrosis. Bladder is underdistended. Stomach/Bowel: Stomach is within normal limits. No evidence of bowel obstruction. Normal appendix (series 3/image 78). Mild rectal wall thickening (series 3/image 117), likely postprocedural. Vascular/Lymphatic: No evidence of abdominal aortic aneurysm. Atherosclerotic calcifications of the abdominal aorta and branch vessels. No suspicious abdominopelvic lymphadenopathy. Reproductive: Status post prostatectomy. Other: No abdominopelvic ascites. Small fat containing left inguinal hernia. Musculoskeletal: Multifocal osseous metastases in the visualized lumbar spine, bilateral pelvis, and bilateral proximal femurs. Although dominant lesions are unchanged, when compared to the prior, mild progression is suspected. For example: --12 mm sclerotic lesion  in the left sacrum (series 3/image 96), previously 8 mm --7 mm lesion posteriorly in the right inferior pubic ramus (series 3/image 126), new --9 mm lesion along the left greater trochanter (series 3/image 126), new IMPRESSION: Status post prostatectomy. No evidence of soft tissue metastases. Multifocal osseous metastases in the visualized axial and appendicular skeleton, with mild progression in the pelvis/bilateral proximal femurs, as described above. Electronically Signed   By: Julian Hy M.D.   On: 03/07/2018 12:58   Ct Abdomen Pelvis W Contrast  Result Date: 03/07/2018 CLINICAL DATA:  Metastatic prostate cancer, status post prostatectomy, chemotherapy and Lupron ongoing EXAM: CT CHEST, ABDOMEN, AND PELVIS WITH CONTRAST TECHNIQUE: Multidetector CT imaging of the chest, abdomen and pelvis was performed following the standard protocol during bolus administration of intravenous contrast. CONTRAST:  1102m OMNIPAQUE IOHEXOL 300 MG/ML  SOLN COMPARISON:  CT abdomen/pelvis and whole-body bone scan dated 09/06/2017 FINDINGS: CT CHEST FINDINGS Cardiovascular: The heart is normal in size. No pericardial effusion. No evidence of thoracic aortic aneurysm. Atherosclerotic calcifications of the aortic arch. Three vessel coronary atherosclerosis. Right chest port terminates in the lower SVC. Mediastinum/Nodes: No suspicious mediastinal, hilar, or axillary lymphadenopathy. Visualized thyroid is mildly heterogeneous/nodular. Lungs/Pleura: Mild linear scarring in the anterior right upper lobe. Mild linear scarring/atelectasis in the bilateral lower lobes. Mild dependent atelectasis in the bilateral lung bases. 3 mm triangular subpleural nodule in the anterior right middle lobe (series 5/image 92), unchanged, benign. No suspicious pulmonary nodules. No focal consolidation. No pleural effusion or pneumothorax. Musculoskeletal: Gynecomastia. Multifocal sclerotic osseous metastases involving the visualized sternum,  bilateral clavicles, multiple ribs, and thoracic spine. This appearance is similar to the prior. CT ABDOMEN PELVIS FINDINGS Hepatobiliary: Liver is within normal limits. Gallbladder is unremarkable. No intrahepatic or extrahepatic ductal dilatation. Pancreas: Within normal limits. Spleen: Within normal limits. Adrenals/Urinary Tract: Adrenal glands within normal limits. Right renal cysts, measuring up to 15 mm in the posterior right upper kidney (series 3/image 70), benign (Bosniak I). 9 mm left upper pole renal calculus (series 3/image 70). No hydronephrosis. Bladder is underdistended. Stomach/Bowel: Stomach is within normal limits. No evidence of bowel obstruction. Normal appendix (series 3/image 78). Mild rectal wall thickening (series 3/image 117), likely postprocedural. Vascular/Lymphatic: No evidence of abdominal aortic aneurysm. Atherosclerotic calcifications of the abdominal aorta and branch vessels. No suspicious abdominopelvic lymphadenopathy. Reproductive: Status post prostatectomy. Other: No abdominopelvic ascites. Small fat containing left inguinal hernia. Musculoskeletal: Multifocal osseous metastases in the visualized lumbar spine, bilateral  pelvis, and bilateral proximal femurs. Although dominant lesions are unchanged, when compared to the prior, mild progression is suspected. For example: --12 mm sclerotic lesion in the left sacrum (series 3/image 96), previously 8 mm --7 mm lesion posteriorly in the right inferior pubic ramus (series 3/image 126), new --9 mm lesion along the left greater trochanter (series 3/image 126), new IMPRESSION: Status post prostatectomy. No evidence of soft tissue metastases. Multifocal osseous metastases in the visualized axial and appendicular skeleton, with mild progression in the pelvis/bilateral proximal femurs, as described above. Electronically Signed   By: Julian Hy M.D.   On: 03/07/2018 12:58      IMPRESSION/PLAN: 1. 57 y.o. gentleman with metastatic  castrate resistant prostate cancer with diffuse bony involvement.  Today we reviewed the findings and workup thus far.  We discussed the natural history of metastatic castration resistant prostate cancer.  We discussed radiation treatment in the management of prostate cancer with regard to the logistics and delivery of a series of 6 monthly Xofigo infusions. We also outlined the short and long term sequela and risks and benefits associated with this therapy. He was encouraged to ask questions that were answered to his stated satisfaction.    At the conclusion of our conversation, the patient elects to proceed with the recommended series of 6 monthly Xofigo infusions.  We will share our findings with Dr. Alen Blew and will move forward with coordinating his treatments to begin in the near future.  He met briefly with Romie Jumper today, who will be assisting with the coordination of his labs and infusions.  Enid Derry will inform the nuclear medicine department, where he will receive his monthly infusions, that he has a Port-A-Cath which is to be accessed for his monthly infusions.   [name], PA-C    Tyler Pita, MD  [facility] Direct Dial: [phone]  Fax: [phone] [email or web address]  Skype  [email or web address]  This document serves as a record of services personally performed by Tyler Pita, MD and Freeman Caldron, PA-C. It was created on their behalf by Arlyce Harman, a trained medical scribe. The creation of this record is based on the scribe's personal observations and the provider's statements to them. This document has been checked and approved by the attending provider.

## 2018-03-29 ENCOUNTER — Other Ambulatory Visit: Payer: Self-pay | Admitting: Radiation Oncology

## 2018-03-29 ENCOUNTER — Telehealth: Payer: Self-pay | Admitting: *Deleted

## 2018-03-29 DIAGNOSIS — C7951 Secondary malignant neoplasm of bone: Principal | ICD-10-CM

## 2018-03-29 DIAGNOSIS — C61 Malignant neoplasm of prostate: Secondary | ICD-10-CM

## 2018-03-29 NOTE — Telephone Encounter (Signed)
Called patient to inform of lab and weight appt. on 04-16-18 @ 12 pm @ [facility] and his Xofigo Inj. on 04-23-18 - arrival time - 11:45 am @ [facility], spoke with patient's wife - Vaughan Basta and she is aware of these appts.

## 2018-03-30 ENCOUNTER — Telehealth: Payer: Self-pay | Admitting: Oncology

## 2018-03-30 ENCOUNTER — Inpatient Hospital Stay: Payer: 59

## 2018-03-30 ENCOUNTER — Inpatient Hospital Stay: Payer: 59 | Attending: Oncology

## 2018-03-30 ENCOUNTER — Inpatient Hospital Stay (HOSPITAL_BASED_OUTPATIENT_CLINIC_OR_DEPARTMENT_OTHER): Payer: 59 | Admitting: Oncology

## 2018-03-30 VITALS — BP 128/83 | HR 76 | Temp 98.0°F | Resp 18 | Ht 69.0 in | Wt 247.4 lb

## 2018-03-30 DIAGNOSIS — C61 Malignant neoplasm of prostate: Secondary | ICD-10-CM

## 2018-03-30 DIAGNOSIS — Z79818 Long term (current) use of other agents affecting estrogen receptors and estrogen levels: Secondary | ICD-10-CM | POA: Insufficient documentation

## 2018-03-30 DIAGNOSIS — Z9221 Personal history of antineoplastic chemotherapy: Secondary | ICD-10-CM

## 2018-03-30 DIAGNOSIS — C7951 Secondary malignant neoplasm of bone: Secondary | ICD-10-CM

## 2018-03-30 DIAGNOSIS — Z95828 Presence of other vascular implants and grafts: Secondary | ICD-10-CM

## 2018-03-30 LAB — CBC WITH DIFFERENTIAL (CANCER CENTER ONLY)
BASOS ABS: 0.1 10*3/uL (ref 0.0–0.1)
Basophils Relative: 1 %
EOS PCT: 3 %
Eosinophils Absolute: 0.2 10*3/uL (ref 0.0–0.5)
HCT: 35.8 % — ABNORMAL LOW (ref 38.4–49.9)
HEMOGLOBIN: 11.7 g/dL — AB (ref 13.0–17.1)
Lymphocytes Relative: 16 %
Lymphs Abs: 1.2 10*3/uL (ref 0.9–3.3)
MCH: 28.7 pg (ref 27.2–33.4)
MCHC: 32.8 g/dL (ref 32.0–36.0)
MCV: 87.5 fL (ref 79.3–98.0)
Monocytes Absolute: 0.6 10*3/uL (ref 0.1–0.9)
Monocytes Relative: 8 %
NEUTROS ABS: 5.6 10*3/uL (ref 1.5–6.5)
NEUTROS PCT: 72 %
Platelet Count: 220 10*3/uL (ref 140–400)
RBC: 4.09 MIL/uL — AB (ref 4.20–5.82)
RDW: 15 % — ABNORMAL HIGH (ref 11.0–14.6)
WBC: 7.8 10*3/uL (ref 4.0–10.3)

## 2018-03-30 LAB — CMP (CANCER CENTER ONLY)
ALT: 18 U/L (ref 0–44)
ANION GAP: 7 (ref 5–15)
AST: 12 U/L — ABNORMAL LOW (ref 15–41)
Albumin: 3.5 g/dL (ref 3.5–5.0)
Alkaline Phosphatase: 78 U/L (ref 38–126)
BUN: 23 mg/dL — ABNORMAL HIGH (ref 6–20)
CHLORIDE: 108 mmol/L (ref 98–111)
CO2: 27 mmol/L (ref 22–32)
Calcium: 8.9 mg/dL (ref 8.9–10.3)
Creatinine: 1.28 mg/dL — ABNORMAL HIGH (ref 0.61–1.24)
Glucose, Bld: 84 mg/dL (ref 70–99)
POTASSIUM: 3.8 mmol/L (ref 3.5–5.1)
SODIUM: 142 mmol/L (ref 135–145)
Total Bilirubin: 0.4 mg/dL (ref 0.3–1.2)
Total Protein: 6.5 g/dL (ref 6.5–8.1)

## 2018-03-30 MED ORDER — LEUPROLIDE ACETATE (4 MONTH) 30 MG IM KIT
30.0000 mg | PACK | Freq: Once | INTRAMUSCULAR | Status: AC
  Administered 2018-03-30: 30 mg via INTRAMUSCULAR
  Filled 2018-03-30: qty 30

## 2018-03-30 MED ORDER — DENOSUMAB 120 MG/1.7ML ~~LOC~~ SOLN
SUBCUTANEOUS | Status: AC
  Filled 2018-03-30: qty 1.7

## 2018-03-30 MED ORDER — HEPARIN SOD (PORK) LOCK FLUSH 100 UNIT/ML IV SOLN
500.0000 [IU] | Freq: Once | INTRAVENOUS | Status: AC
  Administered 2018-03-30: 500 [IU]
  Filled 2018-03-30: qty 5

## 2018-03-30 MED ORDER — SODIUM CHLORIDE 0.9% FLUSH
10.0000 mL | Freq: Once | INTRAVENOUS | Status: AC
  Administered 2018-03-30: 10 mL
  Filled 2018-03-30: qty 10

## 2018-03-30 NOTE — Patient Instructions (Signed)
Leuprolide depot injection What is this medicine? LEUPROLIDE (loo PROE lide) is a man-made protein that acts like a natural hormone in the body. It decreases testosterone in men and decreases estrogen in women. In men, this medicine is used to treat advanced prostate cancer. In women, some forms of this medicine may be used to treat endometriosis, uterine fibroids, or other male hormone-related problems. This medicine may be used for other purposes; ask your health care provider or pharmacist if you have questions. COMMON BRAND NAME(S): Eligard, Lupron Depot, Lupron Depot-Ped, Viadur What should I tell my health care provider before I take this medicine? They need to know if you have any of these conditions: -diabetes -heart disease or previous heart attack -high blood pressure -high cholesterol -mental illness -osteoporosis -pain or difficulty passing urine -seizures -spinal cord metastasis -stroke -suicidal thoughts, plans, or attempt; a previous suicide attempt by you or a family member -tobacco smoker -unusual vaginal bleeding (women) -an unusual or allergic reaction to leuprolide, benzyl alcohol, other medicines, foods, dyes, or preservatives -pregnant or trying to get pregnant -breast-feeding How should I use this medicine? This medicine is for injection into a muscle or for injection under the skin. It is given by a health care professional in a hospital or clinic setting. The specific product will determine how it will be given to you. Make sure you understand which product you receive and how often you will receive it. Talk to your pediatrician regarding the use of this medicine in children. Special care may be needed. Overdosage: If you think you have taken too much of this medicine contact a poison control center or emergency room at once. NOTE: This medicine is only for you. Do not share this medicine with others. What if I miss a dose? It is important not to miss a dose.  Call your doctor or health care professional if you are unable to keep an appointment. Depot injections: Depot injections are given either once-monthly, every 12 weeks, every 16 weeks, or every 24 weeks depending on the product you are prescribed. The product you are prescribed will be based on if you are male or male, and your condition. Make sure you understand your product and dosing. What may interact with this medicine? Do not take this medicine with any of the following medications: -chasteberry This medicine may also interact with the following medications: -herbal or dietary supplements, like black cohosh or DHEA -male hormones, like estrogens or progestins and birth control pills, patches, rings, or injections -male hormones, like testosterone This list may not describe all possible interactions. Give your health care provider a list of all the medicines, herbs, non-prescription drugs, or dietary supplements you use. Also tell them if you smoke, drink alcohol, or use illegal drugs. Some items may interact with your medicine. What should I watch for while using this medicine? Visit your doctor or health care professional for regular checks on your progress. During the first weeks of treatment, your symptoms may get worse, but then will improve as you continue your treatment. You may get hot flashes, increased bone pain, increased difficulty passing urine, or an aggravation of nerve symptoms. Discuss these effects with your doctor or health care professional, some of them may improve with continued use of this medicine. Male patients may experience a menstrual cycle or spotting during the first months of therapy with this medicine. If this continues, contact your doctor or health care professional. What side effects may I notice from receiving this medicine? Side   effects that you should report to your doctor or health care professional as soon as possible: -allergic reactions like skin  rash, itching or hives, swelling of the face, lips, or tongue -breathing problems -chest pain -depression or memory disorders -pain in your legs or groin -pain at site where injected or implanted -seizures -severe headache -swelling of the feet and legs -suicidal thoughts or other mood changes -visual changes -vomiting Side effects that usually do not require medical attention (report to your doctor or health care professional if they continue or are bothersome): -breast swelling or tenderness -decrease in sex drive or performance -diarrhea -hot flashes -loss of appetite -muscle, joint, or bone pains -nausea -redness or irritation at site where injected or implanted -skin problems or acne This list may not describe all possible side effects. Call your doctor for medical advice about side effects. You may report side effects to FDA at 1-800-FDA-1088. Where should I keep my medicine? This drug is given in a hospital or clinic and will not be stored at home. NOTE: This sheet is a summary. It may not cover all possible information. If you have questions about this medicine, talk to your doctor, pharmacist, or health care provider.  2018 Elsevier/Gold Standard (2015-11-26 09:45:53)  

## 2018-03-30 NOTE — Telephone Encounter (Signed)
Appts scheduled AVS/Calendar printed per 10/4 los °

## 2018-03-30 NOTE — Progress Notes (Signed)
Hematology and Oncology Follow Up Visit  [name] [name] [id number] 10-Jan-1961 57 y.o. 03/30/2018 12:55 PM   Principle Diagnosis: 57 year old man with castration-resistant prostate cancer documented in 2016 after initially diagnosed in 2012 with a Gleason score 4+5 = 9.  He has disease to the bone only.   Prior Therapy:  He is S/P radical prostatectomy on 04/18/2011 under the care of Dr. Alinda [name]. The final pathological staging was T3b N1 with 9 positive lymph nodes out of 11 examined.   He continued to have persistent PSA and was treated with androgen deprivation. His PSA became undetectable until August 2014.    In June 2016 PSA went up to 2.0 and subsequently to 5.6 in November 2016. Staging workup including a bone scan done on 05/15/2015 which showed a focus of increased uptake in the posterior parietal occipital region of the skull.   Xtandi started on 06/10/2015.  Therapy discontinued in September 2018 because of progression of disease.  Zytiga 1000 mg daily with prednisone at 5 mg daily started in October 2018.  Therapy discontinued in January 2019 because of poor response.  Taxotere chemotherapy started on 08/04/2017. He received a 75 mg/m on cycle 1.  He is receiving subsequent cycles at 60 mg/m.  He is status post 10 cycles of therapy concluded in August 2019.  Current therapy:   Androgen deprivation utilizing Lupron 30 mg every 4 months.    Xgeva given every 6 weeks.  Xofigo monthly injections to start in October 2019.    Interim History: [name] [name] is here for a return visit.  Since last visit, he reports feeling reasonably well.  Upon completing chemotherapy he has felt much improved and is regaining activity and appetite.  He is eating better and improving his exercise tolerance.  He denies any bone pain or discomfort.  He denies any urination difficulties.  Although he has improvement in his exercise tolerance he is still unable to perform work-related duties.  He does  not report any blurry vision, syncope or seizures.  He denies any dizziness or confusion.  Does not report any fevers, chills, sweats.  Does not report any chest pain, palpitation, orthopnea or leg edema. Does not report any cough, wheezing or shortness of breath. Does not report any nausea, vomiting, or hematochezia.  He denies any constipation or diarrhea.  Does not report frequency urgency or hesitancy.  He does not report any rash or petechiae.  He does not report any skin rashes or lesions.  He denies any anxiety or depression.  Remaining review of systems is negative.  Medications: I have reviewed the patient's current medications.  Current Outpatient Medications  Medication Sig Dispense Refill  . calcium-vitamin D (CALCIUM 500+D) 500-200 MG-UNIT per tablet Take 2 tablets by mouth daily.    Marland Kitchen HYDROcodone-acetaminophen (NORCO) 5-325 MG tablet 1/2 to 1 Q 4 hours prn pain (Patient not taking: Reported on 03/28/2018) 30 tablet 0  . leuprolide (LUPRON) 22.5 MG injection Inject 22.5 mg into the muscle every 4 (four) months.     . lidocaine-prilocaine (EMLA) cream Apply 1 application topically as needed. (Patient taking differently: Apply 1 application topically as needed. ) 30 g 0  . losartan-hydrochlorothiazide (HYZAAR) 100-12.5 MG tablet Take 1 tablet by mouth daily.    . predniSONE (DELTASONE) 5 MG tablet TAKE 1 TABLET BY MOUTH EVERY DAY WITH BREAKFAST 30 tablet 0  . prochlorperazine (COMPAZINE) 10 MG tablet Take 1 tablet (10 mg total) by mouth every 6 (six) hours as needed for  nausea or vomiting. (Patient not taking: Reported on 03/28/2018) 30 tablet 0   No current facility-administered medications for this visit.      Allergies:  Allergies  Allergen Reactions  . Lanolin Itching and Rash    Past Medical History, Surgical history, Social history updated today without any changes.   Physical Exam:    Blood pressure 128/83, pulse 76, temperature 98 F (36.7 C), temperature source Oral,  resp. rate 18, height 5\' 9"  (1.753 m), weight 247 lb 6.4 oz (112.2 kg), SpO2 98 %.    ECOG: 1   General appearance: Alert, awake without any distress. Head: Atraumatic without abnormalities Oropharynx: Without any thrush or ulcers. Eyes: No scleral icterus. Lymph nodes: No lymphadenopathy noted in the cervical, supraclavicular, or axillary nodes Heart:regular rate and rhythm, without any murmurs or gallops.   Lung: Clear to auscultation without any rhonchi, wheezes or dullness to percussion. Abdomin: Soft, nontender without any shifting dullness or ascites. Musculoskeletal: No clubbing or cyanosis. Neurological: No motor or sensory deficits. Skin: No rashes or lesions.       Lab Results: Lab Results  Component Value Date   WBC 6.0 03/02/2018   HGB 10.4 (L) 03/02/2018   HCT 31.0 (L) 03/02/2018   MCV 88.5 03/02/2018   PLT 327 03/02/2018     Chemistry      Component Value Date/Time   NA 146 (H) 03/02/2018 0758   NA 144 06/08/2017 1423   K 3.3 (L) 03/02/2018 0758   K 3.4 (L) 06/08/2017 1423   CL 111 03/02/2018 0758   CO2 25 03/02/2018 0758   CO2 24 06/08/2017 1423   BUN 10 03/02/2018 0758   BUN 16.1 06/08/2017 1423   CREATININE 1.26 (H) 03/02/2018 0758   CREATININE 1.0 06/08/2017 1423      Component Value Date/Time   CALCIUM 7.7 (L) 03/02/2018 0758   CALCIUM 9.6 06/08/2017 1423   ALKPHOS 88 03/02/2018 0758   ALKPHOS 128 06/08/2017 1423   AST 45 (H) 03/02/2018 0758   AST 17 06/08/2017 1423   ALT 41 03/02/2018 0758   ALT 12 06/08/2017 1423   BILITOT 0.6 03/02/2018 0758   BILITOT 0.74 06/08/2017 1423         Results for [name] [name] BOVENZI (MRN [id number]) as of 03/30/2018 12:22  Ref. Range 01/19/2018 07:47 02/09/2018 08:45 03/02/2018 07:58  Prostate Specific Ag, Serum Latest Ref Range: 0.0 - 4.0 ng/mL 84.3 (H) 92.9 (H) 88.4 (H)       Impression and Plan:  57 year old man with:   1.  Castration-resistant prostate cancer after initial diagnosis in 2012  developed bone disease in 2016.  He status post multiple therapies outlined above.   He is currently under consideration to start Trudi Ida which is scheduled for the end of October 2019.  The natural course of his disease was reviewed again including reviewing his imaging studies that obtained in September 2019.  His disease appears to be predominantly in the bone without any visceral metastasis.  I recommended completing 6 months of Xofigo with consideration of second line chemotherapy with Jevtana if he develops worsening disease or visceral metastasis.  He is agreeable with this plan at this time.    2. Androgen depravation: His last Lupron was on December 08, 2017.  His next injection will be in October 2019.  This will be repeated every 4 months.   3. Bone directed therapy: Delton See has been on hold for the time being.  4.  IV access: Port-A-Cath remains in  place without any complications.  I recommended continue flushes every 6 to 8 weeks.  5.  Goals of care: His disease is incurable but aggressive therapy is warranted given his age and performance status..  6. Follow-up: We will be in 6 weeks to follow his progress.  15  minutes was spent with the patient face-to-face today.  More than 50% of time was dedicated to discussing the natural course of his disease, future treatment options and complications related to therapy.  Zola Button, MD 10/4/201912:55 PM

## 2018-03-31 ENCOUNTER — Other Ambulatory Visit: Payer: Self-pay | Admitting: Oncology

## 2018-03-31 DIAGNOSIS — C61 Malignant neoplasm of prostate: Secondary | ICD-10-CM

## 2018-03-31 LAB — PROSTATE-SPECIFIC AG, SERUM (LABCORP): PROSTATE SPECIFIC AG, SERUM: 76.7 ng/mL — AB (ref 0.0–4.0)

## 2018-04-02 ENCOUNTER — Telehealth: Payer: Self-pay | Admitting: *Deleted

## 2018-04-02 NOTE — Telephone Encounter (Signed)
-----   Message from [name], MD sent at 04/02/2018  7:58 AM EDT ----- Please let him know his PSA is down some.

## 2018-04-02 NOTE — Telephone Encounter (Signed)
Spoke with wife Jana Half, gave results of last PSA

## 2018-04-03 NOTE — Progress Notes (Signed)
Disability completed and mailed to patient address on file. No fax number provided to fax form to.

## 2018-04-06 ENCOUNTER — Other Ambulatory Visit: Payer: Self-pay | Admitting: Radiation Oncology

## 2018-04-06 DIAGNOSIS — C7952 Secondary malignant neoplasm of bone marrow: Principal | ICD-10-CM

## 2018-04-06 DIAGNOSIS — C7951 Secondary malignant neoplasm of bone: Secondary | ICD-10-CM

## 2018-04-13 ENCOUNTER — Telehealth: Payer: Self-pay | Admitting: *Deleted

## 2018-04-13 NOTE — Telephone Encounter (Signed)
CALLED PATIENT TO REMIND OF LABS AND WEIGHT FOR 04-16-18 - ARRIVAL TIME - 11:45 AM @ [place], SPOKE WITH PATIENT AND HE IS AWARE OF THESE APPTS.

## 2018-04-16 ENCOUNTER — Ambulatory Visit
Admission: RE | Admit: 2018-04-16 | Discharge: 2018-04-16 | Disposition: A | Discharge status: Home / Self Care | Payer: 59 | Source: Ambulatory Visit | Attending: Radiation Oncology | Admitting: Radiation Oncology

## 2018-04-16 DIAGNOSIS — C61 Malignant neoplasm of prostate: Secondary | ICD-10-CM | POA: Insufficient documentation

## 2018-04-16 DIAGNOSIS — C7952 Secondary malignant neoplasm of bone marrow: Secondary | ICD-10-CM

## 2018-04-16 DIAGNOSIS — C7951 Secondary malignant neoplasm of bone: Secondary | ICD-10-CM | POA: Diagnosis not present

## 2018-04-16 LAB — CBC WITH DIFFERENTIAL (CANCER CENTER ONLY)
Abs Immature Granulocytes: 0.06 10*3/uL (ref 0.00–0.07)
BASOS ABS: 0.1 10*3/uL (ref 0.0–0.1)
Basophils Relative: 0 %
EOS ABS: 0.1 10*3/uL (ref 0.0–0.5)
EOS PCT: 1 %
HEMATOCRIT: 35.9 % — AB (ref 39.0–52.0)
Hemoglobin: 11.3 g/dL — ABNORMAL LOW (ref 13.0–17.0)
Immature Granulocytes: 0 %
LYMPHS ABS: 1.3 10*3/uL (ref 0.7–4.0)
Lymphocytes Relative: 10 %
MCH: 28.1 pg (ref 26.0–34.0)
MCHC: 31.5 g/dL (ref 30.0–36.0)
MCV: 89.3 fL (ref 80.0–100.0)
Monocytes Absolute: 0.9 10*3/uL (ref 0.1–1.0)
Monocytes Relative: 7 %
NRBC: 0 % (ref 0.0–0.2)
Neutro Abs: 11.1 10*3/uL — ABNORMAL HIGH (ref 1.7–7.7)
Neutrophils Relative %: 82 %
Platelet Count: 453 10*3/uL — ABNORMAL HIGH (ref 150–400)
RBC: 4.02 MIL/uL — ABNORMAL LOW (ref 4.22–5.81)
RDW: 13.7 % (ref 11.5–15.5)
WBC Count: 13.5 10*3/uL — ABNORMAL HIGH (ref 4.0–10.5)

## 2018-04-20 ENCOUNTER — Telehealth: Payer: Self-pay | Admitting: *Deleted

## 2018-04-20 NOTE — Telephone Encounter (Signed)
CALLED PATIENT TO REMIND OF XOFIGO INJ. FOR 04-23-18 - ARRIVAL TIME - 11:45 AM @ [facility], LVM FOR A RETURN CALL

## 2018-04-23 ENCOUNTER — Ambulatory Visit (HOSPITAL_COMMUNITY)
Admission: RE | Admit: 2018-04-23 | Discharge: 2018-04-23 | Disposition: A | Discharge status: Home / Self Care | Payer: 59 | Source: Ambulatory Visit | Attending: Radiation Oncology | Admitting: Radiation Oncology

## 2018-04-23 DIAGNOSIS — C7951 Secondary malignant neoplasm of bone: Secondary | ICD-10-CM | POA: Insufficient documentation

## 2018-04-23 DIAGNOSIS — C61 Malignant neoplasm of prostate: Secondary | ICD-10-CM | POA: Diagnosis present

## 2018-04-23 MED ORDER — RADIUM RA 223 DICHLORIDE 30 MCCI/ML IV SOLN: 157.7000 | Freq: Once | INTRAVENOUS | Status: DC

## 2018-04-23 NOTE — Progress Notes (Signed)
  Radiation Oncology         [phone] ________________________________  Name: ADARIUS TIGGES MRN: [id number]  Date: 04/23/2018  DOB: 1960-08-04  Xofigo Treatment Planning Note:  Diagnosis:  Castration resistant prostate cancer with painful bone involvement  Narrative: Mr.[name] is a patient who has been diagnosed with castration resistant prostate cancer with painful bone involvement.  His most recent blood counts show that he remains a good candidate to proceed with Ra-223.  The patient is going to receive Xofigo for his treatment.   Radiation Treatment Planning:  The prescribed radiation activity will be 50 kBq per kg per infusions. The plan is to offer a total of 6 IV administrations of this agent, assuming the blood counts are adequate prior to each administration, with each infusion done at 4 week intervals.  This will be done as an IV administration in the nuclear medicine department, with care to undertake all radiation protection precautions as recommended.   ________________________________  Sheral Apley. Tammi Klippel, M.D.

## 2018-04-23 NOTE — Progress Notes (Signed)
  Radiation Oncology         [phone] ________________________________  Name: DOY TAAFFE MRN: [id number]  Date: 04/23/2018  DOB: 25-Jul-1960  Radium-223 Infusion Note  Diagnosis:  Castration resistant prostate cancer with painful bone involvement  Current Infusion:    1  Planned Infusions:  6  Narrative: Mr. Benard Minturn Zadrozny presented to nuclear medicine for treatment. His most recent blood counts were reviewed.  He remains a good candidate to proceed with Ra-223.  The patient was situated in an infusion suite with a contact barrier placed under his arm. Intravenous access was established, using sterile technique, and a normal saline infusion from a syringe was started.  Micro-dosimetry:  The prescribed radiation activity was assayed and confirmed to be within specified tolerance.  Special Treatment Procedure - Infusion:  The nuclear medicine technologist and I personally verified the dose activity to be delivered as specified in the written directive, and verified the patient identification via 2 separate methods.  The syringe containing the dose was attached to a 3 way stopcock, and then the valve was opened to the patient, and the dose delivered over a minute. No complications were noted.  The total administered dose was 161.0 microcuries in a volume of 8.13 cc.   A saline flush of the line and the syringe that contained the isotope was then performed.  The residual radioactivity in the syringe was 3.3 microcuries, so the actual infused isotope activity was 157.8 microcuries.   Pressure was applied to the venipuncture site, and a compression bandage placed.   Radiation Safety personnel were present to perform the discharge survey, as detailed on their documentation.   After a short period of observation, the patient had his IV removed.  Impression:  The patient tolerated his infusion relatively well.  Plan:  The patient will return in one month for ongoing care.      ________________________________  Sheral Apley. Tammi Klippel, M.D.

## 2018-04-27 ENCOUNTER — Other Ambulatory Visit: Payer: Self-pay | Admitting: Radiation Oncology

## 2018-04-27 ENCOUNTER — Telehealth: Payer: Self-pay | Admitting: *Deleted

## 2018-04-27 DIAGNOSIS — C7951 Secondary malignant neoplasm of bone: Principal | ICD-10-CM

## 2018-04-27 DIAGNOSIS — C61 Malignant neoplasm of prostate: Secondary | ICD-10-CM

## 2018-04-27 NOTE — Telephone Encounter (Signed)
CALLED PATIENT TO INFORM OF LAB AND XOFIGO  INJ., L VM FOR A RETURN CALL

## 2018-04-30 ENCOUNTER — Telehealth: Payer: Self-pay | Admitting: *Deleted

## 2018-04-30 NOTE — Telephone Encounter (Signed)
CALLED PATIENT TO INFORM OF LAB AND WEIGHT AND XOFIGO INJ., LVM FOR A RETURN CALL 

## 2018-05-05 ENCOUNTER — Other Ambulatory Visit: Payer: Self-pay | Admitting: Oncology

## 2018-05-05 DIAGNOSIS — C61 Malignant neoplasm of prostate: Secondary | ICD-10-CM

## 2018-05-14 ENCOUNTER — Inpatient Hospital Stay (HOSPITAL_BASED_OUTPATIENT_CLINIC_OR_DEPARTMENT_OTHER): Payer: 59 | Admitting: Oncology

## 2018-05-14 ENCOUNTER — Inpatient Hospital Stay: Payer: 59 | Attending: Oncology

## 2018-05-14 ENCOUNTER — Telehealth: Payer: Self-pay | Admitting: Oncology

## 2018-05-14 ENCOUNTER — Inpatient Hospital Stay: Payer: 59

## 2018-05-14 VITALS — BP 139/83 | HR 80 | Temp 98.1°F | Resp 18 | Ht 69.0 in | Wt 245.0 lb

## 2018-05-14 DIAGNOSIS — C61 Malignant neoplasm of prostate: Secondary | ICD-10-CM

## 2018-05-14 DIAGNOSIS — M7918 Myalgia, other site: Secondary | ICD-10-CM

## 2018-05-14 DIAGNOSIS — C7951 Secondary malignant neoplasm of bone: Secondary | ICD-10-CM | POA: Diagnosis not present

## 2018-05-14 DIAGNOSIS — Z95828 Presence of other vascular implants and grafts: Secondary | ICD-10-CM

## 2018-05-14 DIAGNOSIS — Z79899 Other long term (current) drug therapy: Secondary | ICD-10-CM | POA: Insufficient documentation

## 2018-05-14 LAB — CMP (CANCER CENTER ONLY)
ALK PHOS: 77 U/L (ref 38–126)
ALT: 18 U/L (ref 0–44)
ANION GAP: 7 (ref 5–15)
AST: 11 U/L — ABNORMAL LOW (ref 15–41)
Albumin: 3.6 g/dL (ref 3.5–5.0)
BUN: 16 mg/dL (ref 6–20)
CHLORIDE: 108 mmol/L (ref 98–111)
CO2: 25 mmol/L (ref 22–32)
Calcium: 9.5 mg/dL (ref 8.9–10.3)
Creatinine: 1.21 mg/dL (ref 0.61–1.24)
GFR, Est AFR Am: >60 mL/min (ref >60)
GFR, Estimated: >60 mL/min (ref >60)
Glucose, Bld: 86 mg/dL (ref 70–99)
Potassium: 3.9 mmol/L (ref 3.5–5.1)
SODIUM: 140 mmol/L (ref 135–145)
Total Bilirubin: 0.4 mg/dL (ref 0.3–1.2)
Total Protein: 7.1 g/dL (ref 6.5–8.1)

## 2018-05-14 LAB — CBC WITH DIFFERENTIAL (CANCER CENTER ONLY)
ABS IMMATURE GRANULOCYTES: 0.02 10*3/uL (ref 0.00–0.07)
BASOS ABS: 0 10*3/uL (ref 0.0–0.1)
Basophils Relative: 1 %
Eosinophils Absolute: 0.1 10*3/uL (ref 0.0–0.5)
Eosinophils Relative: 1 %
HEMATOCRIT: 36.8 % — AB (ref 39.0–52.0)
HEMOGLOBIN: 11.6 g/dL — AB (ref 13.0–17.0)
IMMATURE GRANULOCYTES: 0 %
LYMPHS ABS: 0.9 10*3/uL (ref 0.7–4.0)
LYMPHS PCT: 18 %
MCH: 27.8 pg (ref 26.0–34.0)
MCHC: 31.5 g/dL (ref 30.0–36.0)
MCV: 88 fL (ref 80.0–100.0)
Monocytes Absolute: 0.4 10*3/uL (ref 0.1–1.0)
Monocytes Relative: 7 %
NEUTROS PCT: 73 %
NRBC: 0 % (ref 0.0–0.2)
Neutro Abs: 3.7 10*3/uL (ref 1.7–7.7)
Platelet Count: 245 10*3/uL (ref 150–400)
RBC: 4.18 MIL/uL — ABNORMAL LOW (ref 4.22–5.81)
RDW: 15.1 % (ref 11.5–15.5)
WBC Count: 5.1 10*3/uL (ref 4.0–10.5)

## 2018-05-14 MED ORDER — HEPARIN SOD (PORK) LOCK FLUSH 100 UNIT/ML IV SOLN
500.0000 [IU] | Freq: Once | INTRAVENOUS | Status: AC
  Administered 2018-05-14: 500 [IU]
  Filled 2018-05-14: qty 5

## 2018-05-14 MED ORDER — SODIUM CHLORIDE 0.9% FLUSH
10.0000 mL | Freq: Once | INTRAVENOUS | Status: AC
  Administered 2018-05-14: 10 mL
  Filled 2018-05-14: qty 10

## 2018-05-14 NOTE — Progress Notes (Signed)
Hematology and Oncology Follow Up Visit  [name] [id number] May 12, 1961 57 y.o. 05/14/2018 11:04 AM   Principle Diagnosis: 57 year old man with advanced prostate cancer with disease to the bone diagnosed in 2016.  He has castration-resistant disease after diagnosis and 2012 with a Gleason score 4+5 = 9.  .   Prior Therapy:  He is S/P radical prostatectomy on 04/18/2011 under the care of Dr. Alinda Money. The final pathological staging was T3b N1 with 9 positive lymph nodes out of 11 examined.   He continued to have persistent PSA and was treated with androgen deprivation. His PSA became undetectable until August 2014.    In June 2016 PSA went up to 2.0 and subsequently to 5.6 in November 2016. Staging workup including a bone scan done on 05/15/2015 which showed a focus of increased uptake in the posterior parietal occipital region of the skull.   Xtandi started on 06/10/2015.  Therapy discontinued in September 2018 because of progression of disease.  Zytiga 1000 mg daily with prednisone at 5 mg daily started in October 2018.  Therapy discontinued in January 2019 because of poor response.  Taxotere chemotherapy started on 08/04/2017. He received a 75 mg/m on cycle 1.  He is receiving subsequent cycles at 60 mg/m.  He is status post 10 cycles of therapy concluded in August 2019.  Current therapy:   Androgen deprivation utilizing Lupron 30 mg every 4 months.  Last infusion was given in October 2019. Marland Kitchen  Xofigo monthly injections to starting October 2019.  He is scheduled for the second injection out of 6 on 05/23/2018.    Interim History: Mr. [name] returns today for a repeat evaluation.  Since last visit, he reports no major changes or complaints.  He received the first monthly Xofigo infusion without any complications.  He denies any excessive fatigue, tiredness or infusion related complications.  He reports he has recovered fully from systemic chemotherapy and he has been eating better.   He has recovered most activities of daily living and exercise tolerance.  He denies any worsening arthralgias or myalgias.  He does not report any blurry vision, syncope or seizures.  He denies any alteration in mental status or syncope.  Does not report any fevers, chills, sweats.  Does not report any chest pain, palpitation, orthopnea or leg edema. Does not report any cough, wheezing or shortness of breath. Does not report any nausea, vomiting, or early satiety.  He denies any change in bowel habits.  Does not report frequency urgency or hesitancy.  He does not report any bleeding or clotting tendency.  Not report any mood changes.  Remaining review of systems is negative.  Medications: I have reviewed the patient's current medications.  Current Outpatient Medications  Medication Sig Dispense Refill  . calcium-vitamin D (CALCIUM 500+D) 500-200 MG-UNIT per tablet Take 2 tablets by mouth daily.    Marland Kitchen HYDROcodone-acetaminophen (NORCO) 5-325 MG tablet 1/2 to 1 Q 4 hours prn pain (Patient not taking: Reported on 03/28/2018) 30 tablet 0  . leuprolide (LUPRON) 22.5 MG injection Inject 22.5 mg into the muscle every 4 (four) months.     . lidocaine-prilocaine (EMLA) cream Apply 1 application topically as needed. (Patient taking differently: Apply 1 application topically as needed. ) 30 g 0  . losartan-hydrochlorothiazide (HYZAAR) 100-12.5 MG tablet Take 1 tablet by mouth daily.    . predniSONE (DELTASONE) 5 MG tablet TAKE 1 TABLET BY MOUTH EVERY DAY WITH BREAKFAST 30 tablet 0  . prochlorperazine (COMPAZINE) 10 MG tablet  Take 1 tablet (10 mg total) by mouth every 6 (six) hours as needed for nausea or vomiting. (Patient not taking: Reported on 03/28/2018) 30 tablet 0   No current facility-administered medications for this visit.      Allergies:  Allergies  Allergen Reactions  . Lanolin Itching and Rash    Past Medical History, Surgical history, Social history updated today without any  changes.   Physical Exam:    Blood pressure 139/83, pulse 80, temperature 98.1 F (36.7 C), temperature source Oral, resp. rate 18, height 5\' 9"  (1.753 m), weight 245 lb (111.1 kg), SpO2 100 %.    ECOG: 1   General appearance: Comfortable appearing without any discomfort Head: Normocephalic without any trauma Oropharynx: Mucous membranes are moist and pink without any thrush or ulcers. Eyes: Pupils are equal and round reactive to light. Lymph nodes: No cervical, supraclavicular, inguinal or axillary lymphadenopathy.   Heart:regular rate and rhythm.  S1 and S2 without leg edema. Lung: Clear without any rhonchi or wheezes.  No dullness to percussion. Abdomin: Soft, nontender, nondistended with good bowel sounds.  No hepatosplenomegaly. Musculoskeletal: No joint deformity or effusion.  Full range of motion noted. Neurological: No deficits noted on motor, sensory and deep tendon reflex exam. Skin: No petechial rash or dryness.  Appeared moist.        Lab Results: Lab Results  Component Value Date   WBC 5.1 05/14/2018   HGB 11.6 (L) 05/14/2018   HCT 36.8 (L) 05/14/2018   MCV 88.0 05/14/2018   PLT 245 05/14/2018     Chemistry      Component Value Date/Time   NA 142 03/30/2018 1237   NA 144 06/08/2017 1423   K 3.8 03/30/2018 1237   K 3.4 (L) 06/08/2017 1423   CL 108 03/30/2018 1237   CO2 27 03/30/2018 1237   CO2 24 06/08/2017 1423   BUN 23 (H) 03/30/2018 1237   BUN 16.1 06/08/2017 1423   CREATININE 1.28 (H) 03/30/2018 1237   CREATININE 1.0 06/08/2017 1423      Component Value Date/Time   CALCIUM 8.9 03/30/2018 1237   CALCIUM 9.6 06/08/2017 1423   ALKPHOS 78 03/30/2018 1237   ALKPHOS 128 06/08/2017 1423   AST 12 (L) 03/30/2018 1237   AST 17 06/08/2017 1423   ALT 18 03/30/2018 1237   ALT 12 06/08/2017 1423   BILITOT 0.4 03/30/2018 1237   BILITOT 0.74 06/08/2017 1423         Results for Kohrs, JUNG YURCHAK (MRN [id number]) as of 05/14/2018 09:18  Ref. Range  02/09/2018 08:45 03/02/2018 07:58 03/30/2018 12:37  Prostate Specific Ag, Serum Latest Ref Range: 0.0 - 4.0 ng/mL 92.9 (H) 88.4 (H) 76.7 (H)       Impression and Plan:  57 year old man with:   1.  Castration-resistant prostate cancer with disease predominantly to the bone.  He is currently receiving Xofigo without any complications.     His disease status was updated today and future treatment options were reiterated.  At this time I recommended continuing Xofigo to complete 6 months of therapy.  I also counseled him about PSA monitoring in the setting of Xofigo at this time.  Unless he develops rapid rise in his PSA to suggest visceral metastasis, I recommended treating him to complete 6 months of therapy [place] to his PSA.    2. Androgen depravation: The plan is to continue with Lupron every 4 months his next injection scheduled in February 2020.  Long-term comp occasion associated with  this therapy was reiterated and is agreeable to continue..   3. Bone directed therapy: Due to toxicities of Xofigo, therapy will be on hold for the time being.  4.  IV access: Port-A-Cath will be flushed every 8 weeks.  He is agreeable to keep it for the time being.  5.  Goals of care: Therapy remains palliative and his performance status remains excellent.  I recommended continue aggressive measures.  6. Follow-up: We will be in 8 weeks to follow his progress.  15  minutes was spent with the patient face-to-face today.  More than 50% of time was dedicated to updating his disease status, future treatment options and answering questions regarding future plan of care  Zola Button, MD 11/18/201911:04 AM

## 2018-05-14 NOTE — Telephone Encounter (Signed)
Printed calendar and avs. °

## 2018-05-15 ENCOUNTER — Telehealth: Payer: Self-pay | Admitting: *Deleted

## 2018-05-15 LAB — PROSTATE-SPECIFIC AG, SERUM (LABCORP): Prostate Specific Ag, Serum: 147 ng/mL — ABNORMAL HIGH (ref 0.0–4.0)

## 2018-05-15 LAB — TESTOSTERONE: Testosterone: <3 ng/dL — ABNORMAL LOW (ref 264–916)

## 2018-05-15 NOTE — Telephone Encounter (Signed)
-----   Message from [name], MD sent at 05/15/2018  8:12 AM EST ----- Please let him know his PSA is up. No change in plan for now.

## 2018-05-15 NOTE — Telephone Encounter (Signed)
Spoke with wife linda, gave results of last PSA

## 2018-05-15 NOTE — Telephone Encounter (Signed)
CALLED PATIENT TO REMIND OF LAB AND WEIGHT FOR 05-16-18, SPOKE WITH PATIENT AND HE INFORMED ME THAT LAB WAS DONE YESTERDAY BY DR. Alen Blew AND THAT THEY ALSO OBTAINED WEIGHT.

## 2018-05-16 ENCOUNTER — Ambulatory Visit: Payer: 59

## 2018-05-17 IMAGING — NM NM BONE WHOLE BODY
2 series · 2 of 2 positions shown · non-contrast
Comparison: 03/21/2017 and prior bone scans.

CLINICAL DATA: Prostate carcinoma. Bilateral knee pain. Evaluate
for metastatic disease.

EXAM:
NUCLEAR MEDICINE WHOLE BODY BONE SCAN
TECHNIQUE: Whole body anterior and posterior images were obtained approximately
3 hours after intravenous injection of radiopharmaceutical.
RADIOPHARMACEUTICALS:  21.1 mCi Technetium-55m MDP IV

[Series 1: whole body · 2.66mm/px · 1 of 1 slices shown (1 of 2)]
[im 1/1]
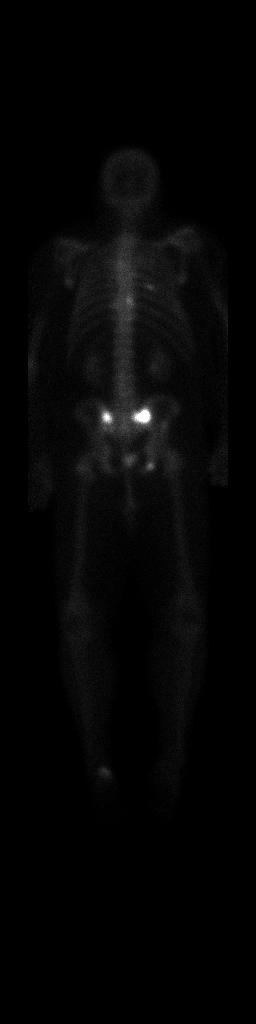

[Series 1: whole body · 2.66mm/px · 1 of 1 slices shown (2 of 2)]
[im 1/1]
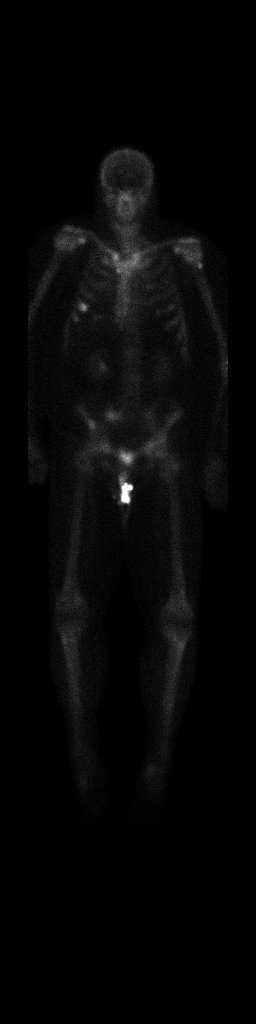

[2 of 2 positions shown; findings below may reference images not displayed]

FINDINGS: There is focal uptake along the anterior right fifth rib with more
subtle uptake from the underlying anterior right sixth rib. There is
focal uptake from the posterior right eighth rib. Uptake is seen in
the right posterior aspect of the T9 vertebra. There is a focal area
of uptake along the proximal left humerus. In the pelvis, there is
focal uptake from the anterior right sacral ala and right ilium,
with more intense uptake noted posteriorly from the sacral ala
bilaterally. Focal uptake is noted from the right ischium. The area
of uptake along the proximal left humerus and right ilium appear new
from the prior bone scan. On the current CT scan, all of these areas
of uptake correspond to sclerotic lesions. There are more sclerotic
lesions on CT than are evident on the bone scan.

There are also areas of degenerative uptake involving the shoulders,
sternoclavicular joints, hips, and knees and ankles, which are
stable from the prior exam.
IMPRESSION: 1. Mild interval progression of metastatic disease to bone. New foci
of uptake are seen involving the proximal left humerus and anterior
right ilium. The other previously described areas abnormal skeletal
uptake are stable.

## 2018-05-22 ENCOUNTER — Other Ambulatory Visit: Payer: Self-pay | Admitting: Oncology

## 2018-05-22 ENCOUNTER — Telehealth: Payer: Self-pay | Admitting: *Deleted

## 2018-05-22 DIAGNOSIS — C61 Malignant neoplasm of prostate: Secondary | ICD-10-CM

## 2018-05-22 NOTE — Telephone Encounter (Signed)
Called patient to remind of Xofigo Inj. for 05-23-18 - arrival time - 11:30 am @ [facility], lvm for a return call

## 2018-05-23 ENCOUNTER — Ambulatory Visit (HOSPITAL_COMMUNITY)
Admission: RE | Admit: 2018-05-23 | Discharge: 2018-05-23 | Disposition: A | Discharge status: Home / Self Care | Payer: 59 | Source: Ambulatory Visit | Attending: Radiation Oncology | Admitting: Radiation Oncology

## 2018-05-23 DIAGNOSIS — C61 Malignant neoplasm of prostate: Secondary | ICD-10-CM | POA: Diagnosis not present

## 2018-05-23 DIAGNOSIS — C7951 Secondary malignant neoplasm of bone: Secondary | ICD-10-CM | POA: Insufficient documentation

## 2018-05-23 MED ORDER — RADIUM RA 223 DICHLORIDE 30 MCCI/ML IV SOLN
163.9000 | Freq: Once | INTRAVENOUS | Status: AC
  Administered 2018-05-23: 163.9 via INTRAVENOUS

## 2018-05-23 NOTE — Progress Notes (Signed)
  Radiation Oncology         (336) [phone] ________________________________  Name: [name] MRN: [id number]  Date: 05/23/2018  DOB: 1960-10-23  Radium-223 Infusion Note  Diagnosis:  Castration resistant prostate cancer with painful bone involvement  Current Infusion:    2  Planned Infusions:  6  Narrative: Mr. [name] presented to nuclear medicine for treatment. His most recent blood counts were reviewed.  He remains a good candidate to proceed with Ra-223.  The patient was situated in an infusion suite with a contact barrier placed under his arm. Intravenous access was established, using sterile technique, and a normal saline infusion from a syringe was started.  Micro-dosimetry:  The prescribed radiation activity was assayed and confirmed to be within specified tolerance.  Special Treatment Procedure - Infusion:  The nuclear medicine technologist and I personally verified the dose activity to be delivered as specified in the written directive, and verified the patient identification via 2 separate methods.  The syringe containing the dose was attached to a 3 way stopcock, and then the valve was opened to the patient, and the dose delivered over a minute. No complications were noted.  The total administered dose was 169.7 microcuries in a volume of 6.26 cc.   A saline flush of the line and the syringe that contained the isotope was then performed.  The residual radioactivity in the syringe was 5.8 microcuries, so the actual infused isotope activity was 163.9 microcuries.   Pressure was applied to the venipuncture site, and a compression bandage placed.   Radiation Safety personnel were present to perform the discharge survey, as detailed on their documentation.   After a short period of observation, the patient had his IV removed.  Impression:  The patient tolerated his infusion relatively well.  Plan:  The patient will return in one month for ongoing care.      ________________________________  Sheral Apley. Tammi Klippel, M.D.   This document serves as a record of services personally performed by Tyler Pita, MD. It was created on his behalf by Wilburn Mylar, a trained medical scribe. The creation of this record is based on the scribe's personal observations and the provider's statements to them. This document has been checked and approved by the attending provider.

## 2018-06-01 ENCOUNTER — Telehealth: Payer: Self-pay | Admitting: *Deleted

## 2018-06-01 ENCOUNTER — Other Ambulatory Visit: Payer: Self-pay | Admitting: Radiation Oncology

## 2018-06-01 DIAGNOSIS — C61 Malignant neoplasm of prostate: Secondary | ICD-10-CM

## 2018-06-01 DIAGNOSIS — C7951 Secondary malignant neoplasm of bone: Principal | ICD-10-CM

## 2018-06-01 NOTE — Telephone Encounter (Signed)
CALLED PATIENT TO INFORM OF LAB AND WEIGHT ON 06-07-18 @ [place] AND HIS [place]. ON 06-14-18 - ARRIVAL TIME - 12:15 PM @ [facility], SPOKE WITH PATIENT'S WIFE - LINDA AND SHE IS AWARE OF THESE APPTS.

## 2018-06-07 ENCOUNTER — Ambulatory Visit
Admission: RE | Admit: 2018-06-07 | Discharge: 2018-06-07 | Disposition: A | Discharge status: Home / Self Care | Payer: 59 | Source: Ambulatory Visit | Attending: Radiation Oncology | Admitting: Radiation Oncology

## 2018-06-07 DIAGNOSIS — C7951 Secondary malignant neoplasm of bone: Secondary | ICD-10-CM | POA: Diagnosis not present

## 2018-06-07 DIAGNOSIS — C61 Malignant neoplasm of prostate: Secondary | ICD-10-CM | POA: Diagnosis not present

## 2018-06-07 LAB — CMP (CANCER CENTER ONLY)
ALT: 14 U/L (ref 0–44)
AST: 10 U/L — ABNORMAL LOW (ref 15–41)
Albumin: 3.8 g/dL (ref 3.5–5.0)
Alkaline Phosphatase: 74 U/L (ref 38–126)
Anion gap: 10 (ref 5–15)
BILIRUBIN TOTAL: 0.6 mg/dL (ref 0.3–1.2)
BUN: 21 mg/dL — ABNORMAL HIGH (ref 6–20)
CO2: 29 mmol/L (ref 22–32)
Calcium: 9.9 mg/dL (ref 8.9–10.3)
Chloride: 104 mmol/L (ref 98–111)
Creatinine: 1.36 mg/dL — ABNORMAL HIGH (ref 0.61–1.24)
GFR, EST NON AFRICAN AMERICAN: 57 mL/min — AB (ref >60)
GFR, Est AFR Am: >60 mL/min (ref >60)
Glucose, Bld: 101 mg/dL — ABNORMAL HIGH (ref 70–99)
POTASSIUM: 4.4 mmol/L (ref 3.5–5.1)
SODIUM: 143 mmol/L (ref 135–145)
Total Protein: 7.1 g/dL (ref 6.5–8.1)

## 2018-06-07 LAB — CBC WITH DIFFERENTIAL (CANCER CENTER ONLY)
ABS IMMATURE GRANULOCYTES: 0.01 10*3/uL (ref 0.00–0.07)
BASOS ABS: 0 10*3/uL (ref 0.0–0.1)
Basophils Relative: 1 %
EOS ABS: 0.1 10*3/uL (ref 0.0–0.5)
EOS PCT: 1 %
HCT: 39.9 % (ref 39.0–52.0)
HEMOGLOBIN: 12.6 g/dL — AB (ref 13.0–17.0)
IMMATURE GRANULOCYTES: 0 %
Lymphocytes Relative: 10 %
Lymphs Abs: 0.8 10*3/uL (ref 0.7–4.0)
MCH: 27.3 pg (ref 26.0–34.0)
MCHC: 31.6 g/dL (ref 30.0–36.0)
MCV: 86.4 fL (ref 80.0–100.0)
MONOS PCT: 6 %
Monocytes Absolute: 0.5 10*3/uL (ref 0.1–1.0)
NEUTROS ABS: 6.3 10*3/uL (ref 1.7–7.7)
NEUTROS PCT: 82 %
PLATELETS: 249 10*3/uL (ref 150–400)
RBC: 4.62 MIL/uL (ref 4.22–5.81)
RDW: 15.9 % — AB (ref 11.5–15.5)
WBC: 7.6 10*3/uL (ref 4.0–10.5)
nRBC: 0 % (ref 0.0–0.2)

## 2018-06-08 LAB — PROSTATE-SPECIFIC AG, SERUM (LABCORP): PROSTATE SPECIFIC AG, SERUM: 177 ng/mL — AB (ref 0.0–4.0)

## 2018-06-13 ENCOUNTER — Telehealth: Payer: Self-pay | Admitting: *Deleted

## 2018-06-13 NOTE — Telephone Encounter (Signed)
Called patient to remind of Xofigo Inj. for 06-14-18 - arrival time- 12:15 pm @ [facility], spoke with patient and he is aware of this appt.

## 2018-06-14 ENCOUNTER — Ambulatory Visit (HOSPITAL_COMMUNITY)
Admission: RE | Admit: 2018-06-14 | Discharge: 2018-06-14 | Disposition: A | Discharge status: Home / Self Care | Payer: 59 | Source: Ambulatory Visit | Attending: Radiation Oncology | Admitting: Radiation Oncology

## 2018-06-14 DIAGNOSIS — C61 Malignant neoplasm of prostate: Secondary | ICD-10-CM | POA: Diagnosis not present

## 2018-06-14 DIAGNOSIS — C7951 Secondary malignant neoplasm of bone: Secondary | ICD-10-CM | POA: Insufficient documentation

## 2018-06-14 MED ORDER — RADIUM RA 223 DICHLORIDE 30 MCCI/ML IV SOLN
168.2500 | Freq: Once | INTRAVENOUS | Status: AC
  Administered 2018-06-14: 168.25 via INTRAVENOUS

## 2018-06-14 NOTE — Progress Notes (Signed)
  Radiation Oncology         [phone]) [phone] ________________________________  Name: [name] MRN: [id number]  Date: 06/14/2018  DOB: 1960-12-21  Radium-223 Infusion Note  Diagnosis:  Castration resistant prostate cancer with painful bone involvement  Current Infusion:    3  Planned Infusions:  6  Narrative: [name] presented to nuclear medicine for treatment. His most recent blood counts were reviewed.  He remains a good candidate to proceed with Ra-223.  The patient was situated in an infusion suite with a contact barrier placed under his arm. Intravenous access was established, using sterile technique, and a normal saline infusion from a syringe was started.  Micro-dosimetry:  The prescribed radiation activity was assayed and confirmed to be within specified tolerance.  Special Treatment Procedure - Infusion:  The nuclear medicine technologist and I personally verified the dose activity to be delivered as specified in the written directive, and verified the patient identification via 2 separate methods.  The syringe containing the dose was attached to a 3 way stopcock, and then the valve was opened to the patient, and the dose delivered over a minute. No complications were noted.  The total administered dose was 172.6 microcuries in a volume of 5 cc.   A saline flush of the line and the syringe that contained the isotope was then performed.  The residual radioactivity in the syringe was 4.35 microcuries, so the actual infused isotope activity was 168.25 microcuries.   Pressure was applied to the venipuncture site, and a compression bandage placed.   Radiation Safety personnel were present to perform the discharge survey, as detailed on their documentation.   After a short period of observation, the patient had his IV removed.  Impression:  The patient tolerated his infusion relatively well.  Plan:  The patient will return in one month for ongoing care.      ________________________________  Sheral Apley. Tammi Klippel, M.D.   This document serves as a record of services personally performed by Tyler Pita, MD. It was created on his behalf by Wilburn Mylar, a trained medical scribe. The creation of this record is based on the scribe's personal observations and the provider's statements to them. This document has been checked and approved by the attending provider.

## 2018-07-02 ENCOUNTER — Other Ambulatory Visit (HOSPITAL_COMMUNITY): Payer: Self-pay | Admitting: Radiation Oncology

## 2018-07-02 DIAGNOSIS — C61 Malignant neoplasm of prostate: Secondary | ICD-10-CM

## 2018-07-02 DIAGNOSIS — C7951 Secondary malignant neoplasm of bone: Principal | ICD-10-CM

## 2018-07-10 ENCOUNTER — Inpatient Hospital Stay: Payer: 59

## 2018-07-10 ENCOUNTER — Inpatient Hospital Stay (HOSPITAL_BASED_OUTPATIENT_CLINIC_OR_DEPARTMENT_OTHER): Payer: 59 | Admitting: Oncology

## 2018-07-10 ENCOUNTER — Other Ambulatory Visit: Payer: Self-pay | Admitting: Oncology

## 2018-07-10 ENCOUNTER — Telehealth: Payer: Self-pay | Admitting: Oncology

## 2018-07-10 ENCOUNTER — Inpatient Hospital Stay: Payer: 59 | Attending: Oncology

## 2018-07-10 VITALS — BP 124/87 | HR 75 | Temp 97.6°F | Resp 18 | Ht 69.0 in | Wt 244.7 lb

## 2018-07-10 DIAGNOSIS — Z95828 Presence of other vascular implants and grafts: Secondary | ICD-10-CM

## 2018-07-10 DIAGNOSIS — C61 Malignant neoplasm of prostate: Secondary | ICD-10-CM | POA: Insufficient documentation

## 2018-07-10 DIAGNOSIS — Z9221 Personal history of antineoplastic chemotherapy: Secondary | ICD-10-CM

## 2018-07-10 DIAGNOSIS — C7951 Secondary malignant neoplasm of bone: Secondary | ICD-10-CM | POA: Insufficient documentation

## 2018-07-10 DIAGNOSIS — Z79899 Other long term (current) drug therapy: Secondary | ICD-10-CM | POA: Diagnosis not present

## 2018-07-10 DIAGNOSIS — Z923 Personal history of irradiation: Secondary | ICD-10-CM | POA: Insufficient documentation

## 2018-07-10 LAB — CBC WITH DIFFERENTIAL (CANCER CENTER ONLY)
Abs Immature Granulocytes: 0.04 10*3/uL (ref 0.00–0.07)
BASOS ABS: 0 10*3/uL (ref 0.0–0.1)
Basophils Relative: 1 %
EOS ABS: 0 10*3/uL (ref 0.0–0.5)
EOS PCT: 1 %
HEMATOCRIT: 39.3 % (ref 39.0–52.0)
Hemoglobin: 12.7 g/dL — ABNORMAL LOW (ref 13.0–17.0)
IMMATURE GRANULOCYTES: 1 %
LYMPHS PCT: 22 %
Lymphs Abs: 1.1 10*3/uL (ref 0.7–4.0)
MCH: 27.5 pg (ref 26.0–34.0)
MCHC: 32.3 g/dL (ref 30.0–36.0)
MCV: 85.2 fL (ref 80.0–100.0)
Monocytes Absolute: 0.4 10*3/uL (ref 0.1–1.0)
Monocytes Relative: 9 %
NEUTROS PCT: 66 %
NRBC: 0 % (ref 0.0–0.2)
Neutro Abs: 3.2 10*3/uL (ref 1.7–7.7)
PLATELETS: 231 10*3/uL (ref 150–400)
RBC: 4.61 MIL/uL (ref 4.22–5.81)
RDW: 15.7 % — AB (ref 11.5–15.5)
WBC: 4.8 10*3/uL (ref 4.0–10.5)

## 2018-07-10 LAB — CMP (CANCER CENTER ONLY)
ALBUMIN: 3.5 g/dL (ref 3.5–5.0)
ALT: 15 U/L (ref 0–44)
ANION GAP: 8 (ref 5–15)
AST: 10 U/L — AB (ref 15–41)
Alkaline Phosphatase: 62 U/L (ref 38–126)
BUN: 20 mg/dL (ref 6–20)
CALCIUM: 9.3 mg/dL (ref 8.9–10.3)
CHLORIDE: 108 mmol/L (ref 98–111)
CO2: 26 mmol/L (ref 22–32)
Creatinine: 1.27 mg/dL — ABNORMAL HIGH (ref 0.61–1.24)
Glucose, Bld: 99 mg/dL (ref 70–99)
Potassium: 3.9 mmol/L (ref 3.5–5.1)
Sodium: 142 mmol/L (ref 135–145)
Total Bilirubin: 0.4 mg/dL (ref 0.3–1.2)
Total Protein: 6.8 g/dL (ref 6.5–8.1)

## 2018-07-10 MED ORDER — SODIUM CHLORIDE 0.9% FLUSH
10.0000 mL | Freq: Once | INTRAVENOUS | Status: AC
  Administered 2018-07-10: 10 mL
  Filled 2018-07-10: qty 10

## 2018-07-10 MED ORDER — HEPARIN SOD (PORK) LOCK FLUSH 100 UNIT/ML IV SOLN
500.0000 [IU] | Freq: Once | INTRAVENOUS | Status: AC
  Administered 2018-07-10: 500 [IU]
  Filled 2018-07-10: qty 5

## 2018-07-10 NOTE — Telephone Encounter (Signed)
Printed calendar and avs. °

## 2018-07-10 NOTE — Progress Notes (Signed)
Hematology and Oncology Follow Up Visit  JILLIAN PIANKA [id number] November 27, 1960 58 y.o. 07/10/2018 9:16 AM   Principle Diagnosis: 58 year old man with castration-resistant prostate cancer with disease documented in 2014.  He presented with Gleason score 4+5 = 9 in 2012.   Prior Therapy:  He is S/P radical prostatectomy on 04/18/2011 under the care of Dr. Alinda Money. The final pathological staging was T3b N1 with 9 positive lymph nodes out of 11 examined.   He continued to have persistent PSA and was treated with androgen deprivation. His PSA became undetectable until August 2014.    In June 2016 PSA went up to 2.0 and subsequently to 5.6 in November 2016. Staging workup including a bone scan done on 05/15/2015 which showed a focus of increased uptake in the posterior parietal occipital region of the skull.   Xtandi started on 06/10/2015.  Therapy discontinued in September 2018 because of progression of disease.  Zytiga 1000 mg daily with prednisone at 5 mg daily started in October 2018.  Therapy discontinued in January 2019 because of poor response.  Taxotere chemotherapy started on 08/04/2017. He received a 75 mg/m on cycle 1.  He is receiving subsequent cycles at 60 mg/m.  He is status post 10 cycles of therapy concluded in August 2019.  Current therapy:   Androgen deprivation utilizing Lupron 30 mg every 4 months.  Last infusion was given in October 2019. Marland Kitchen  Xofigo monthly injections to starting October 2019.  He is is status post 3 treatments out of scheduled 6.    Interim History: Mr. Raschke presents today for a repeat evaluation.  Since the last visit, he reports no major changes in his health.  He continues to tolerate Xofigo without any major complications.  He denies any infusion related issues but does report some mild fatigue.  His energy and performance status continues to improve off chemotherapy.  Denies any worsening bone pain or pathological fractures.  He does not require any  pain medication.  His appetite remains excellent.  He does not report any blurry vision, syncope or seizures.  He denies any confusion or lethargy.  Does not report any fevers, chills, sweats.  Does not report any chest pain, palpitation, orthopnea or leg edema. Does not report any cough, wheezing or dyspnea on exertion.  Does not report any nausea, vomiting, or abdominal distention.  He denies any constipation or diarrhea.  Does not report frequency urgency or hesitancy.  He does not report any ecchymosis petechiae.  Not report any anxiety or depression.  Remaining review of systems is negative.  Medications: I have reviewed the patient's current medications.  Current Outpatient Medications  Medication Sig Dispense Refill  . calcium-vitamin D (CALCIUM 500+D) 500-200 MG-UNIT per tablet Take 2 tablets by mouth daily.    Marland Kitchen HYDROcodone-acetaminophen (NORCO) 5-325 MG tablet 1/2 to 1 Q 4 hours prn pain (Patient not taking: Reported on 03/28/2018) 30 tablet 0  . leuprolide (LUPRON) 22.5 MG injection Inject 22.5 mg into the muscle every 4 (four) months.     . lidocaine-prilocaine (EMLA) cream Apply 1 application topically as needed. (Patient taking differently: Apply 1 application topically as needed. ) 30 g 0  . losartan-hydrochlorothiazide (HYZAAR) 100-12.5 MG tablet Take 1 tablet by mouth daily.    . predniSONE (DELTASONE) 5 MG tablet TAKE 1 TABLET BY MOUTH EVERY DAY WITH BREAKFAST 30 tablet 0  . prochlorperazine (COMPAZINE) 10 MG tablet Take 1 tablet (10 mg total) by mouth every 6 (six) hours as needed for  nausea or vomiting. (Patient not taking: Reported on 03/28/2018) 30 tablet 0   No current facility-administered medications for this visit.      Allergies:  Allergies  Allergen Reactions  . Lanolin Itching and Rash    Past Medical History, Surgical history, Social history updated today without any changes.   Physical Exam:    Blood pressure 124/87, pulse 75, temperature 97.6 F (36.4  C), temperature source Oral, resp. rate 18, height 5\' 9"  (1.753 m), weight 244 lb 11.2 oz (111 kg), SpO2 100 %.     ECOG: 1    General appearance: Alert, awake without any distress. Head: Atraumatic without abnormalities Oropharynx: Without any thrush or ulcers. Eyes: No scleral icterus. Lymph nodes: No lymphadenopathy noted in the cervical, supraclavicular, or axillary nodes Heart:regular rate and rhythm, without any murmurs or gallops.   Lung: Clear to auscultation without any rhonchi, wheezes or dullness to percussion. Abdomin: Soft, nontender without any shifting dullness or ascites. Musculoskeletal: No clubbing or cyanosis. Neurological: No motor or sensory deficits. Skin: No rashes or lesions. Psychiatric: Mood and affect appeared normal.       Lab Results: Lab Results  Component Value Date   WBC 7.6 06/07/2018   HGB 12.6 (L) 06/07/2018   HCT 39.9 06/07/2018   MCV 86.4 06/07/2018   PLT 249 06/07/2018     Chemistry      Component Value Date/Time   NA 143 06/07/2018 1218   NA 144 06/08/2017 1423   K 4.4 06/07/2018 1218   K 3.4 (L) 06/08/2017 1423   CL 104 06/07/2018 1218   CO2 29 06/07/2018 1218   CO2 24 06/08/2017 1423   BUN 21 (H) 06/07/2018 1218   BUN 16.1 06/08/2017 1423   CREATININE 1.36 (H) 06/07/2018 1218   CREATININE 1.0 06/08/2017 1423      Component Value Date/Time   CALCIUM 9.9 06/07/2018 1218   CALCIUM 9.6 06/08/2017 1423   ALKPHOS 74 06/07/2018 1218   ALKPHOS 128 06/08/2017 1423   AST 10 (L) 06/07/2018 1218   AST 17 06/08/2017 1423   ALT 14 06/07/2018 1218   ALT 12 06/08/2017 1423   BILITOT 0.6 06/07/2018 1218   BILITOT 0.74 06/08/2017 1423        Results for Olivera, AIMAN NOE (MRN [id number]) as of 07/10/2018 08:45  Ref. Range 03/30/2018 12:37 05/14/2018 10:25 06/07/2018 12:18  Prostate Specific Ag, Serum Latest Ref Range: 0.0 - 4.0 ng/mL 76.7 (H) 147.0 (H) 177.0 (H)         Impression and Plan:  58 year old man with:   1.   Advanced prostate cancer with disease to the bone that is currently castration-resistant.    He continues to tolerate Xofigo without any major complications with overall improvement in his bone pain.  Risks and benefits of continuing this therapy was discussed today and he is agreeable to continue.  His PSA continues to rise which is expected in this particular setting.  Different salvage therapy will be required after completing Xofigo.    2. Androgen depravation: He is currently on Lupron which will be repeated in February 2020.  Complications associated with this therapy were reiterated again and is agreeable to proceed.   3. Bone directed therapy: Delton See has been discontinued at this time based on his wishes.  I recommended continuing calcium and vitamin D supplement.  4.  IV access: He will have a Port-A-Cath flush every 8 weeks.  His Port-A-Cath will remain in place for future chemotherapy.  5.  Goals of care: Therapy remains palliative at this time and aggressive therapy is warranted given his excellent performance status.  6. Follow-up: We will be in 2 months for repeat evaluation.  15  minutes was spent with the patient face-to-face today.  More than 50% of time was dedicated to discussing his disease status, treatment options and coordinating plan of care.  Zola Button, MD 1/14/20209:16 AM

## 2018-07-11 ENCOUNTER — Telehealth: Payer: Self-pay | Admitting: Radiation Oncology

## 2018-07-11 ENCOUNTER — Telehealth: Payer: Self-pay | Admitting: Urology

## 2018-07-11 LAB — PROSTATE-SPECIFIC AG, SERUM (LABCORP): Prostate Specific Ag, Serum: 304 ng/mL — ABNORMAL HIGH (ref 0.0–4.0)

## 2018-07-11 NOTE — Telephone Encounter (Signed)
Received two voice mails this morning from patient's wife, Vaughan Basta. She expresses increasing anxiety surrounding her husband's rising PSA. In both voicemails Vaughan Basta is requesting a return call from Dr. Tammi Klippel. Vaughan Basta verbalized in the voicemails that her husbands PSA continues to rise despite [place].   Noted that patient was seen yesterday by Dr. Alen Blew and has plans for follow up in two months. Noted Lupron is scheduled to be administered February 2020. Noted Delton See was discontinued and discussion of aggressive palliative approach was had.   Informed Ashlyn Bruning, PA-C of these findings since Dr. Tammi Klippel is not in the office today. Ashlyn expressed her intention to phone the wife and reinforce that PSA is not indicative of how well Trudi Ida is working.

## 2018-07-11 NOTE — Telephone Encounter (Signed)
I returned the patient's wife, [name]'s, phone call to further discuss her concern regarding Curtis's persistently rising PSA despite currently receiving Xofigo infusions.  He has completed 3 infusions out of 6 and is scheduled for his fourth infusion on 07/17/2018.  He has continued to tolerate these well, but unfortunately, his most recent PSA has shown continued rise at 304 on 07/10/2018, previous PSA on 06/07/2018 was 177.  She is understandably very concerned.  I attempted to reassure her that while this is indeed evidence of disease progression, it is not an indication that Xofigo treatments are not working as PSA is not a measure of success with these treatments.  We discussed that the overall benefit of Xofigo treatments, regardless of how the PSA responds, is improved overall survival.  We discussed that the PSA progression is evidence that he will likely need further systemic treatment but that this would fall under the expertise of Dr. Alen Blew regarding further treatment options.  I also advised that it would be at the purview of Dr. Alen Blew regarding if and when further imaging studies would be obtained for disease restaging purposes.  She appears to have a good understanding of our conversation and my recommendations but remains highly concerned and anxious, requesting a phone call from Dr. Alen Blew to address the rising PSA and his recommendations regarding further evaluation.  I advised that I would be happy to share this information with Dr. Alen Blew.  [name] will follow-up as scheduled on 07/17/2018 for #4 of 6 planned Xofigo infusions.    [name], MMS, PA-C [facility] [place]: [phone]  Fax: [phone]

## 2018-07-12 ENCOUNTER — Other Ambulatory Visit: Payer: Self-pay | Admitting: Oncology

## 2018-07-12 NOTE — Progress Notes (Signed)
The results of the PSA discussed with the patient's wife over the phone today.  The natural course of his disease as well as treatment options moving forward were reviewed.  After discussion today, I have recommended continuing Xofigo to complete 6 months.  Different salvage therapy may be needed likely in the form of chemotherapy upon completing may be needed.  Do not advise restarting chemotherapy while he is receiving Xofigo.  I reiterated the overall survival advantage of completing Xofigo prior to proceeding with additional treatment.

## 2018-07-16 ENCOUNTER — Telehealth: Payer: Self-pay | Admitting: *Deleted

## 2018-07-16 NOTE — Telephone Encounter (Signed)
Called patient to remind of Xofigo Inj. For 07-17-18 - arrival time- 11:45 am @ [facility], spoke with patient's wife and she is aware of this inj.

## 2018-07-17 ENCOUNTER — Ambulatory Visit (HOSPITAL_COMMUNITY)
Admission: RE | Admit: 2018-07-17 | Discharge: 2018-07-17 | Disposition: A | Discharge status: Home / Self Care | Payer: 59 | Source: Ambulatory Visit | Attending: Radiation Oncology | Admitting: Radiation Oncology

## 2018-07-17 DIAGNOSIS — C61 Malignant neoplasm of prostate: Secondary | ICD-10-CM

## 2018-07-17 DIAGNOSIS — C7951 Secondary malignant neoplasm of bone: Secondary | ICD-10-CM | POA: Insufficient documentation

## 2018-07-17 MED ORDER — RADIUM RA 223 DICHLORIDE 30 MCCI/ML IV SOLN
158.4000 | Freq: Once | INTRAVENOUS | Status: AC
  Administered 2018-07-17: 158.4 via INTRAVENOUS

## 2018-07-17 NOTE — Progress Notes (Signed)
  Radiation Oncology         [phone]) [phone] ________________________________  Name: MARUICE PIERONI MRN: [id number]  Date: 07/17/2018  DOB: [birth year]  Radium-223 Infusion Note  Diagnosis:  Castration resistant prostate cancer with painful bone involvement  Current Infusion:    4  Planned Infusions:  6  Narrative: Mr. Camara Rosander Maclay presented to nuclear medicine for treatment. His most recent blood counts were reviewed.  He remains a good candidate to proceed with Ra-223.  The patient was situated in an infusion suite with a contact barrier placed under his arm. Intravenous access was established, using sterile technique, and a normal saline infusion from a syringe was started.  Micro-dosimetry:  The prescribed radiation activity was assayed and confirmed to be within specified tolerance.  Special Treatment Procedure - Infusion:  The nuclear medicine technologist and I personally verified the dose activity to be delivered as specified in the written directive, and verified the patient identification via 2 separate methods.  The syringe containing the dose was attached to an intravenous access and the dose delivered over a minute. No complications were noted.  The total administered dose was 161.3 microcuries in a volume of 8.69 mL.   A saline flush of the line and the syringe that contained the isotope was then performed.  The residual radioactivity in the syringe was 2.9 microcuries, so the actual infused isotope activity was 158.4 microcuries.   Pressure was applied to the venipuncture site, and a compression bandage placed.   Radiation Safety personnel were present to perform the discharge survey, as detailed on their documentation.   After a short period of observation, the patient had his IV removed.  Impression:  The patient tolerated his infusion relatively well.  Plan:  The patient will return in one month for ongoing care.    ________________________________  Sheral Apley. Tammi Klippel,  M.D.   This document serves as a record of services personally performed by Tyler Pita, MD. It was created on his behalf by Wilburn Mylar, a trained medical scribe. The creation of this record is based on the scribe's personal observations and the provider's statements to them. This document has been checked and approved by the attending provider.

## 2018-07-25 ENCOUNTER — Other Ambulatory Visit (HOSPITAL_COMMUNITY): Payer: Self-pay | Admitting: Radiation Oncology

## 2018-07-25 DIAGNOSIS — C61 Malignant neoplasm of prostate: Secondary | ICD-10-CM

## 2018-07-25 DIAGNOSIS — C7951 Secondary malignant neoplasm of bone: Principal | ICD-10-CM

## 2018-07-26 ENCOUNTER — Telehealth: Payer: Self-pay | Admitting: *Deleted

## 2018-07-26 NOTE — Telephone Encounter (Signed)
Called patient to inform of lab and weight for 08-10-18 @ 12:15 pm @ [facility] and his Xofigo Inj. on 06-17-19- arrival time- 11:45 am @ [facility], spoke with patient and he is aware of these appts.

## 2018-08-01 ENCOUNTER — Other Ambulatory Visit: Payer: Self-pay | Admitting: Radiation Oncology

## 2018-08-01 DIAGNOSIS — C7952 Secondary malignant neoplasm of bone marrow: Principal | ICD-10-CM

## 2018-08-01 DIAGNOSIS — C7951 Secondary malignant neoplasm of bone: Secondary | ICD-10-CM

## 2018-08-07 ENCOUNTER — Other Ambulatory Visit: Payer: Self-pay | Admitting: Oncology

## 2018-08-07 DIAGNOSIS — C61 Malignant neoplasm of prostate: Secondary | ICD-10-CM

## 2018-08-09 ENCOUNTER — Telehealth: Payer: Self-pay | Admitting: *Deleted

## 2018-08-09 NOTE — Telephone Encounter (Signed)
Called patient to remind of lab and weight for 08-10-18 @ 12:15 pm @ [facility], lvm for a return call

## 2018-08-10 ENCOUNTER — Ambulatory Visit
Admission: RE | Admit: 2018-08-10 | Discharge: 2018-08-10 | Disposition: A | Discharge status: Home / Self Care | Payer: 59 | Source: Ambulatory Visit | Attending: Radiation Oncology | Admitting: Radiation Oncology

## 2018-08-10 DIAGNOSIS — C7951 Secondary malignant neoplasm of bone: Secondary | ICD-10-CM | POA: Insufficient documentation

## 2018-08-10 DIAGNOSIS — C61 Malignant neoplasm of prostate: Secondary | ICD-10-CM | POA: Insufficient documentation

## 2018-08-10 DIAGNOSIS — C7952 Secondary malignant neoplasm of bone marrow: Secondary | ICD-10-CM

## 2018-08-10 LAB — CBC WITH DIFFERENTIAL (CANCER CENTER ONLY)
Abs Immature Granulocytes: 0.06 10*3/uL (ref 0.00–0.07)
BASOS ABS: 0 10*3/uL (ref 0.0–0.1)
BASOS PCT: 1 %
EOS ABS: 0.1 10*3/uL (ref 0.0–0.5)
Eosinophils Relative: 1 %
HEMATOCRIT: 39.8 % (ref 39.0–52.0)
Hemoglobin: 13.1 g/dL (ref 13.0–17.0)
IMMATURE GRANULOCYTES: 1 %
Lymphocytes Relative: 9 %
Lymphs Abs: 0.7 10*3/uL (ref 0.7–4.0)
MCH: 28 pg (ref 26.0–34.0)
MCHC: 32.9 g/dL (ref 30.0–36.0)
MCV: 85 fL (ref 80.0–100.0)
Monocytes Absolute: 0.6 10*3/uL (ref 0.1–1.0)
Monocytes Relative: 7 %
NEUTROS PCT: 81 %
NRBC: 0 % (ref 0.0–0.2)
Neutro Abs: 7.1 10*3/uL (ref 1.7–7.7)
Platelet Count: 291 10*3/uL (ref 150–400)
RBC: 4.68 MIL/uL (ref 4.22–5.81)
RDW: 16.1 % — ABNORMAL HIGH (ref 11.5–15.5)
WBC Count: 8.5 10*3/uL (ref 4.0–10.5)

## 2018-08-16 ENCOUNTER — Telehealth: Payer: Self-pay | Admitting: *Deleted

## 2018-08-16 NOTE — Telephone Encounter (Signed)
CALLED PATIENT TO REMIND OF XOFIGO INJ. FOR 08-17-18 - ARRIVAL TIME- 11:45 AM @ [facility], SPOKE WITH PATIENT'S WIFE AND SHE IS AWARE OF THIS APPT.

## 2018-08-17 ENCOUNTER — Ambulatory Visit (HOSPITAL_COMMUNITY)
Admission: RE | Admit: 2018-08-17 | Discharge: 2018-08-17 | Disposition: A | Discharge status: Home / Self Care | Payer: 59 | Source: Ambulatory Visit | Attending: Radiation Oncology | Admitting: Radiation Oncology

## 2018-08-17 DIAGNOSIS — C7951 Secondary malignant neoplasm of bone: Secondary | ICD-10-CM | POA: Insufficient documentation

## 2018-08-17 DIAGNOSIS — C61 Malignant neoplasm of prostate: Secondary | ICD-10-CM

## 2018-08-17 MED ORDER — RADIUM RA 223 DICHLORIDE 30 MCCI/ML IV SOLN
161.4100 | Freq: Once | INTRAVENOUS | Status: AC
  Administered 2018-08-17: 161.41 via INTRAVENOUS

## 2018-08-17 NOTE — Progress Notes (Signed)
  Radiation Oncology         [phone] ________________________________  Name: [name] MRN: [id number]  Date: 08/17/2018  DOB: 06/08/1961  Radium-223 Infusion Note  Diagnosis:  Castration resistant prostate cancer with painful bone involvement  Current Infusion:    5  Planned Infusions:  6  Narrative: Mr. [name] presented to nuclear medicine for treatment. His most recent blood counts were reviewed.  He remains a good candidate to proceed with Ra-223.  The patient was situated in an infusion suite with a contact barrier placed under his arm. Intravenous access was established, using sterile technique, and a normal saline infusion from a syringe was started.  Micro-dosimetry:  The prescribed radiation activity was assayed and confirmed to be within specified tolerance.  Special Treatment Procedure - Infusion:  The nuclear medicine technologist and I personally verified the dose activity to be delivered as specified in the written directive, and verified the patient identification via 2 separate methods.  The syringe containing the dose was attached to an intravenous access and the dose delivered over a minute. No complications were noted.  The total administered dose was 165.4 microcuries in a volume of 6.85 cc.   A saline flush of the line and the syringe that contained the isotope was then performed.  The residual radioactivity in the syringe was 3.99 microcuries, so the actual infused isotope activity was 161.41 microcuries.   Pressure was applied to the venipuncture site, and a compression bandage placed.   Radiation Safety personnel were present to perform the discharge survey, as detailed on their documentation.   After a short period of observation, the patient had his IV removed.  Impression:  The patient tolerated his infusion relatively well.  Plan:  The patient will return in one month for ongoing care.    ________________________________  Sheral Apley. Tammi Klippel,  M.D.   This document serves as a record of services personally performed by Tyler Pita, MD. It was created on his behalf by Wilburn Mylar, a trained medical scribe. The creation of this record is based on the scribe's personal observations and the provider's statements to them. This document has been checked and approved by the attending provider.

## 2018-08-29 ENCOUNTER — Telehealth: Payer: Self-pay | Admitting: *Deleted

## 2018-08-29 ENCOUNTER — Other Ambulatory Visit (HOSPITAL_COMMUNITY): Payer: Self-pay | Admitting: Radiation Oncology

## 2018-08-29 DIAGNOSIS — C61 Malignant neoplasm of prostate: Secondary | ICD-10-CM

## 2018-08-29 NOTE — Telephone Encounter (Signed)
CALLED PATIENT TO INFORM OF LAB AND WEIGHT ON 09-14-18 @ [place] AND HIS [place]. ON 09-21-18 @ [facility], SPOKE WITH PATIENT'S WIFE- LINDA AND SHE IS AWARE OF THESE APPTS.

## 2018-09-05 ENCOUNTER — Telehealth: Payer: Self-pay | Admitting: Oncology

## 2018-09-05 ENCOUNTER — Inpatient Hospital Stay: Payer: 59

## 2018-09-05 ENCOUNTER — Inpatient Hospital Stay (HOSPITAL_BASED_OUTPATIENT_CLINIC_OR_DEPARTMENT_OTHER): Payer: 59 | Admitting: Oncology

## 2018-09-05 ENCOUNTER — Inpatient Hospital Stay: Payer: 59 | Attending: Oncology

## 2018-09-05 ENCOUNTER — Other Ambulatory Visit: Payer: Self-pay

## 2018-09-05 VITALS — BP 115/70 | HR 76 | Temp 98.0°F | Resp 17 | Ht 69.0 in | Wt 246.9 lb

## 2018-09-05 DIAGNOSIS — C61 Malignant neoplasm of prostate: Secondary | ICD-10-CM

## 2018-09-05 DIAGNOSIS — Z95828 Presence of other vascular implants and grafts: Secondary | ICD-10-CM

## 2018-09-05 DIAGNOSIS — Z7689 Persons encountering health services in other specified circumstances: Secondary | ICD-10-CM | POA: Insufficient documentation

## 2018-09-05 DIAGNOSIS — C7951 Secondary malignant neoplasm of bone: Secondary | ICD-10-CM | POA: Diagnosis not present

## 2018-09-05 DIAGNOSIS — C775 Secondary and unspecified malignant neoplasm of intrapelvic lymph nodes: Secondary | ICD-10-CM | POA: Insufficient documentation

## 2018-09-05 DIAGNOSIS — Z79899 Other long term (current) drug therapy: Secondary | ICD-10-CM | POA: Diagnosis not present

## 2018-09-05 DIAGNOSIS — C787 Secondary malignant neoplasm of liver and intrahepatic bile duct: Secondary | ICD-10-CM | POA: Diagnosis not present

## 2018-09-05 DIAGNOSIS — Z5111 Encounter for antineoplastic chemotherapy: Secondary | ICD-10-CM | POA: Diagnosis not present

## 2018-09-05 LAB — CMP (CANCER CENTER ONLY)
ALBUMIN: 3.4 g/dL — AB (ref 3.5–5.0)
ALT: 18 U/L (ref 0–44)
AST: 17 U/L (ref 15–41)
Alkaline Phosphatase: 104 U/L (ref 38–126)
Anion gap: 12 (ref 5–15)
BILIRUBIN TOTAL: 0.6 mg/dL (ref 0.3–1.2)
BUN: 21 mg/dL — AB (ref 6–20)
CHLORIDE: 106 mmol/L (ref 98–111)
CO2: 22 mmol/L (ref 22–32)
Calcium: 9.2 mg/dL (ref 8.9–10.3)
Creatinine: 1.2 mg/dL (ref 0.61–1.24)
GFR, Est AFR Am: >60 mL/min (ref >60)
GFR, Estimated: >60 mL/min (ref >60)
GLUCOSE: 121 mg/dL — AB (ref 70–99)
Potassium: 3.7 mmol/L (ref 3.5–5.1)
Sodium: 140 mmol/L (ref 135–145)
TOTAL PROTEIN: 6.7 g/dL (ref 6.5–8.1)

## 2018-09-05 LAB — CBC WITH DIFFERENTIAL (CANCER CENTER ONLY)
Abs Immature Granulocytes: 0.06 10*3/uL (ref 0.00–0.07)
BASOS ABS: 0 10*3/uL (ref 0.0–0.1)
BASOS PCT: 0 %
Eosinophils Absolute: 0.1 10*3/uL (ref 0.0–0.5)
Eosinophils Relative: 2 %
HEMATOCRIT: 36 % — AB (ref 39.0–52.0)
Hemoglobin: 11.4 g/dL — ABNORMAL LOW (ref 13.0–17.0)
IMMATURE GRANULOCYTES: 1 %
Lymphocytes Relative: 13 %
Lymphs Abs: 0.6 10*3/uL — ABNORMAL LOW (ref 0.7–4.0)
MCH: 27.9 pg (ref 26.0–34.0)
MCHC: 31.7 g/dL (ref 30.0–36.0)
MCV: 88.2 fL (ref 80.0–100.0)
MONO ABS: 0.3 10*3/uL (ref 0.1–1.0)
MONOS PCT: 6 %
Neutro Abs: 4 10*3/uL (ref 1.7–7.7)
Neutrophils Relative %: 78 %
Platelet Count: 204 10*3/uL (ref 150–400)
RBC: 4.08 MIL/uL — ABNORMAL LOW (ref 4.22–5.81)
RDW: 15.6 % — AB (ref 11.5–15.5)
WBC: 5.1 10*3/uL (ref 4.0–10.5)
nRBC: 0 % (ref 0.0–0.2)

## 2018-09-05 MED ORDER — LEUPROLIDE ACETATE (4 MONTH) 30 MG IM KIT
30.0000 mg | PACK | Freq: Once | INTRAMUSCULAR | Status: AC
  Administered 2018-09-05: 30 mg via INTRAMUSCULAR
  Filled 2018-09-05: qty 30

## 2018-09-05 MED ORDER — SODIUM CHLORIDE 0.9% FLUSH
10.0000 mL | Freq: Once | INTRAVENOUS | Status: AC
  Administered 2018-09-05: 10 mL
  Filled 2018-09-05: qty 10

## 2018-09-05 MED ORDER — HEPARIN SOD (PORK) LOCK FLUSH 100 UNIT/ML IV SOLN
500.0000 [IU] | Freq: Once | INTRAVENOUS | Status: AC
  Administered 2018-09-05: 500 [IU]
  Filled 2018-09-05: qty 5

## 2018-09-05 NOTE — Progress Notes (Signed)
Hematology and Oncology Follow Up Visit  [name] [id number] [birth year] 58 y.o. 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     Prior Therapy:  He is S/P radical prostatectomy on 04/18/2011 under the care of Dr. Alinda Money. The final pathological staging was T3b N1 with 9 positive lymph nodes out of 11 examined.   He continued to have persistent PSA and was treated with androgen deprivation. His PSA became undetectable until August 2014.    In June 2016 PSA went up to 2.0 and subsequently to 5.6 in November 2016. Staging workup including a bone scan done on 05/15/2015 which showed a focus of increased uptake in the posterior parietal occipital region of the skull.   Xtandi started on 06/10/2015.  Therapy discontinued in September 2018 because of progression of disease.  Zytiga 1000 mg daily with prednisone at 5 mg daily started in October 2018.  Therapy discontinued in January 2019 because of poor response.  Taxotere chemotherapy started on 08/04/2017. He received a 75 mg/m on cycle 1.  He is receiving subsequent cycles at 60 mg/m.  He is status post 10 cycles of therapy concluded in August 2019.  Current therapy:   Androgen deprivation utilizing Lupron 30 mg every 4 months.  Last infusion was given in October 2019. Marland Kitchen  Xofigo monthly injections to starting October 2019.  He completed 5 months of therapy.    Interim History: Mr. Makar turns today for a repeat evaluation.  Since her last visit, he reports no major changes in his health.  He still slightly debilitated overall with few complaints.  His appetite has been marginal although his weight has not changed.  He still able to eat 1-2 meals a day.  He does report some arthralgias but no worsening bone pain.  He does not take any pain medication at this time.  He ambulates  short distances but does not stay out of the house for an extended period of time.  His quality of life remains maintained.  He did have an episode of vomiting and diarrhea on 2 separate occasions although appears to resolve quickly.  Patient denied any alteration mental status, neuropathy, confusion or dizziness.  Denies any headaches or lethargy.  Denies any night sweats, weight loss or changes in appetite.  Denied orthopnea, dyspnea on exertion or chest discomfort.  Denies shortness of breath, difficulty breathing hemoptysis or cough.  Denies any abdominal distention, nausea, early satiety or dyspepsia.  Denies any hematuria, frequency, dysuria or nocturia.  Denies any skin irritation, dryness or rash.  Denies any ecchymosis or petechiae.  Denies any lymphadenopathy or clotting.  Denies any heat or cold intolerance.  Denies any anxiety or depression.  Remaining review of system is negative.      Medications: I have reviewed the patient's current medications.  Current Outpatient Medications  Medication Sig Dispense Refill  . calcium-vitamin D (CALCIUM 500+D) 500-200 MG-UNIT per tablet Take 2 tablets by mouth daily.    . hydrochlorothiazide (HYDRODIURIL) 12.5 MG tablet hydrochlorothiazide 12.5 mg tablet  TAKE 1 TABLET BY MOUTH EVERY DAY    . HYDROcodone-acetaminophen (NORCO) 5-325 MG tablet 1/2 to 1 Q 4 hours prn pain (Patient not taking: Reported on 03/28/2018) 30 tablet 0  . leuprolide (LUPRON) 22.5 MG injection Inject 22.5 mg into the muscle every 4 (four) months.     Marland Kitchen  lidocaine-prilocaine (EMLA) cream Apply 1 application topically as needed. (Patient taking differently: Apply 1 application topically as needed. ) 30 g 0  . losartan (COZAAR) 100 MG tablet losartan 100 mg tablet  TAKE 1 TABLET BY MOUTH EVERY DAY    . predniSONE (DELTASONE) 5 MG tablet TAKE 1 TABLET BY MOUTH EVERY DAY WITH BREAKFAST 30 tablet 0  . prochlorperazine (COMPAZINE) 10 MG tablet Take 1 tablet (10 mg total) by mouth  every 6 (six) hours as needed for nausea or vomiting. 30 tablet 0  . tamsulosin (FLOMAX) 0.4 MG CAPS capsule tamsulosin 0.4 mg capsule     No current facility-administered medications for this visit.      Allergies:  Allergies  Allergen Reactions  . Lanolin Itching and Rash    Past Medical History, Surgical history, Social history updated today without any changes.   Physical Exam:    Blood pressure 115/70, pulse 76, temperature 98 F (36.7 C), temperature source Oral, resp. rate 17, height 5\' 9"  (1.753 m), weight 246 lb 14.4 oz (112 kg), SpO2 100 %.      ECOG: 1    General appearance: Comfortable appearing without any discomfort Head: Normocephalic without any trauma Oropharynx: Mucous membranes are moist and pink without any thrush or ulcers. Eyes: Pupils are equal and round reactive to light. Lymph nodes: No cervical, supraclavicular, inguinal or axillary lymphadenopathy.   Heart:regular rate and rhythm.  S1 and S2 without leg edema. Lung: Clear without any rhonchi or wheezes.  No dullness to percussion. Abdomin: Soft, nontender, nondistended with good bowel sounds.  No hepatosplenomegaly. Musculoskeletal: No joint deformity or effusion.  Full range of motion noted. Neurological: No deficits noted on motor, sensory and deep tendon reflex exam. Skin: No petechial rash or dryness.  Appeared moist.         Lab Results: Lab Results  Component Value Date   WBC 5.1 09/05/2018   HGB 11.4 (L) 09/05/2018   HCT 36.0 (L) 09/05/2018   MCV 88.2 09/05/2018   PLT 204 09/05/2018     Chemistry      Component Value Date/Time   NA 142 07/10/2018 0853   NA 144 06/08/2017 1423   K 3.9 07/10/2018 0853   K 3.4 (L) 06/08/2017 1423   CL 108 07/10/2018 0853   CO2 26 07/10/2018 0853   CO2 24 06/08/2017 1423   BUN 20 07/10/2018 0853   BUN 16.1 06/08/2017 1423   CREATININE 1.27 (H) 07/10/2018 0853   CREATININE 1.0 06/08/2017 1423      Component Value Date/Time    CALCIUM 9.3 07/10/2018 0853   CALCIUM 9.6 06/08/2017 1423   ALKPHOS 62 07/10/2018 0853   ALKPHOS 128 06/08/2017 1423   AST 10 (L) 07/10/2018 0853   AST 17 06/08/2017 1423   ALT 15 07/10/2018 0853   ALT 12 06/08/2017 1423   BILITOT 0.4 07/10/2018 0853   BILITOT 0.74 06/08/2017 1423          Results for Ruttan, QUINCE SANTANA (MRN [id number]) as of 09/05/2018 09:18  Ref. Range 06/07/2018 12:18 07/10/2018 08:53  Prostate Specific Ag, Serum Latest Ref Range: 0.0 - 4.0 ng/mL 177.0 (H) 304.0 (H)        Impression and Plan:  58 year old man with:   1.  Castration-resistant prostate cancer with disease to the bone.  He is status post multiple therapies outlined above.     He remains on Xofigo without any major complications.  The natural course of his disease and subsequent treatment options  were discussed.  He is experiencing a rise in his PSA although his health status remains relatively stable with few complaints.  I have recommended completing the 6 months of Xofigo which scheduled to be concluded the end of this month.  Shortly thereafter, we will restage him with a CT scan and a bone scan.  Different salvage therapy will be required after that.  Treatment options are limited at this time and I feel that he will require treatment with chemotherapy likely in the form of Jevtana.  Complication associated with this chemotherapy were reviewed today include nausea, fatigue myelosuppression among others.  He understands that he will require on and off treatment for the remaining of his life.   2. Androgen depravation: He will continue to receive Lupron every 4 months.  His next injection scheduled for today and will be continued indefinitely.  Long-term complications including osteoporosis and weight gain among others.   3. Bone directed therapy: I recommended continuing calcium and vitamin D supplements.  Delton See was withheld because of toxicities.  4.  IV access: Port-A-Cath remains in place  which will be flushed periodically and will be in use for future chemotherapy.  5.  Goals of care: Therapy remains palliative at this time and his disease is incurable.  He understands that his disease eventually will progress in a way that no further therapy will be given.  His performance status remains reasonable and aggressive therapy remains warranted however.  6. Follow-up: We will be in 1 month to follow his progress.  25  minutes was spent with the patient face-to-face today.  More than 50% of time was spent on reviewing his disease status, reviewing laboratory data and answering question regarding future plan of care.  Zola Button, MD 3/11/20209:19 AM

## 2018-09-05 NOTE — Patient Instructions (Signed)

## 2018-09-05 NOTE — Telephone Encounter (Signed)
Gave avs and calendar ° °

## 2018-09-06 ENCOUNTER — Telehealth: Payer: Self-pay | Admitting: *Deleted

## 2018-09-06 LAB — PROSTATE-SPECIFIC AG, SERUM (LABCORP): Prostate Specific Ag, Serum: 745 ng/mL — ABNORMAL HIGH (ref 0.0–4.0)

## 2018-09-06 NOTE — Telephone Encounter (Signed)
Spoke with wife linda, gave results of last PSA. Per dr Alen Blew, will move ct scan and bone scan up to next week d/t increase in PSA

## 2018-09-06 NOTE — Telephone Encounter (Signed)
Faxed  O.V. note and labs to D. Wilhelmina Mcardle per patient's request fax [phone]

## 2018-09-06 NOTE — Telephone Encounter (Signed)
-----   Message from [name], MD sent at 09/06/2018  8:03 AM EDT ----- Please call his wife cell phone with the PSA results. Will move scans (bone scan + CT scan) to next week given the increase in his PSA.

## 2018-09-07 ENCOUNTER — Telehealth: Payer: Self-pay | Admitting: *Deleted

## 2018-09-07 NOTE — Telephone Encounter (Signed)
Notified of message below

## 2018-09-07 NOTE — Telephone Encounter (Signed)
After the scan is done and reviewed, will schedule follow up.

## 2018-09-07 NOTE — Telephone Encounter (Signed)
Wife left a message stating they have scheduled CT for 3/18.   Is wanting to know if they can see Dr Alen Blew on 3/19 to review results.

## 2018-09-12 ENCOUNTER — Ambulatory Visit (HOSPITAL_COMMUNITY)
Admission: RE | Admit: 2018-09-12 | Discharge: 2018-09-12 | Disposition: A | Discharge status: Home / Self Care | Payer: 59 | Source: Ambulatory Visit | Attending: Oncology | Admitting: Oncology

## 2018-09-12 ENCOUNTER — Encounter (HOSPITAL_COMMUNITY): Payer: 59

## 2018-09-12 ENCOUNTER — Encounter (HOSPITAL_COMMUNITY)
Admission: RE | Admit: 2018-09-12 | Discharge: 2018-09-12 | Disposition: A | Discharge status: Home / Self Care | Payer: 59 | Source: Ambulatory Visit | Attending: Oncology | Admitting: Oncology

## 2018-09-12 ENCOUNTER — Other Ambulatory Visit: Payer: Self-pay

## 2018-09-12 DIAGNOSIS — C61 Malignant neoplasm of prostate: Secondary | ICD-10-CM

## 2018-09-12 MED ORDER — IOHEXOL 300 MG/ML  SOLN
100.0000 mL | Freq: Once | INTRAMUSCULAR | Status: AC | PRN
  Administered 2018-09-12: 100 mL via INTRAVENOUS

## 2018-09-12 MED ORDER — SODIUM CHLORIDE (PF) 0.9 % IJ SOLN
INTRAMUSCULAR | Status: AC
  Filled 2018-09-12: qty 50

## 2018-09-13 ENCOUNTER — Other Ambulatory Visit: Payer: Self-pay | Admitting: Radiation Oncology

## 2018-09-13 ENCOUNTER — Telehealth: Payer: Self-pay | Admitting: Oncology

## 2018-09-13 ENCOUNTER — Inpatient Hospital Stay (HOSPITAL_BASED_OUTPATIENT_CLINIC_OR_DEPARTMENT_OTHER): Payer: 59 | Admitting: Oncology

## 2018-09-13 ENCOUNTER — Telehealth: Payer: Self-pay | Admitting: *Deleted

## 2018-09-13 ENCOUNTER — Other Ambulatory Visit: Payer: Self-pay

## 2018-09-13 VITALS — BP 136/81 | HR 82 | Temp 98.9°F | Resp 18 | Ht 69.0 in | Wt 246.1 lb

## 2018-09-13 DIAGNOSIS — C61 Malignant neoplasm of prostate: Secondary | ICD-10-CM | POA: Diagnosis not present

## 2018-09-13 DIAGNOSIS — C7951 Secondary malignant neoplasm of bone: Secondary | ICD-10-CM

## 2018-09-13 DIAGNOSIS — C775 Secondary and unspecified malignant neoplasm of intrapelvic lymph nodes: Secondary | ICD-10-CM | POA: Diagnosis not present

## 2018-09-13 DIAGNOSIS — Z5111 Encounter for antineoplastic chemotherapy: Secondary | ICD-10-CM | POA: Diagnosis not present

## 2018-09-13 DIAGNOSIS — C787 Secondary malignant neoplasm of liver and intrahepatic bile duct: Secondary | ICD-10-CM

## 2018-09-13 DIAGNOSIS — C7952 Secondary malignant neoplasm of bone marrow: Principal | ICD-10-CM

## 2018-09-13 DIAGNOSIS — Z79899 Other long term (current) drug therapy: Secondary | ICD-10-CM

## 2018-09-13 NOTE — Progress Notes (Signed)
Hematology and Oncology Follow Up Visit  [name] [id number] [birth year] 58 y.o. 09/13/2018 2:25 PM   Principle Diagnosis: 58 year old man with castration-resistant prostate cancer with disease to the bone diagnosed in 2016.  He presented with  Gleason score 4+5 = 9 in 2012.  He has a documented metastatic disease in the liver in March 2020.   Prior Therapy:  He is S/P radical prostatectomy on 04/18/2011 under the care of Dr. Alinda Money. The final pathological staging was T3b N1 with 9 positive lymph nodes out of 11 examined.   He continued to have persistent PSA and was treated with androgen deprivation. His PSA became undetectable until August 2014.    In June 2016 PSA went up to 2.0 and subsequently to 5.6 in November 2016. Staging workup including a bone scan done on 05/15/2015 which showed a focus of increased uptake in the posterior parietal occipital region of the skull.   Xtandi started on 06/10/2015.  Therapy discontinued in September 2018 because of progression of disease.  Zytiga 1000 mg daily with prednisone at 5 mg daily started in October 2018.  Therapy discontinued in January 2019 because of poor response.  Taxotere chemotherapy started on 08/04/2017. He received a 75 mg/m on cycle 1.  He is receiving subsequent cycles at 60 mg/m.  He is status post 10 cycles of therapy concluded in August 2019.   Xofigo monthly injections to starting October 2019.  He completed 5 months of therapy in February 2020.   Current therapy:   Androgen deprivation utilizing Lupron 30 mg every 4 months.  Last infusion was given in October 2019.  He is under consideration to start systemic chemotherapy.  Interim History: Mr. [name] is here for a follow-up visit.  Since the last visit, he completed staging work-up with a CT scan and bone scan and here to discuss the results.  Since his last visit he reports no major changes in his health.  He reports feeling reasonably fair and overall not  debilitated.  He is able to eat and maintain his weight and quality of life.  He denies any worsening bone pain or pathological fractures.  He denies any early satiety or hospitalizations.  Patient denied headaches, blurry vision, syncope or seizures.  Denies any fevers, chills or sweats.  Denied chest pain, palpitation, orthopnea or leg edema.  Denied cough, wheezing or hemoptysis.  Denied nausea, vomiting or abdominal pain.  Denies any constipation or diarrhea.  Denies any frequency urgency or hesitancy.  Denies any arthralgias or myalgias.  Denies any skin rashes or lesions.  Denies any bleeding or clotting tendency.  Denies any easy bruising.  Denies any hair or nail changes.  Denies any anxiety or depression.  Remaining review of system is negative.       Medications: I have reviewed the patient's current medications.  Current Outpatient Medications  Medication Sig Dispense Refill  . calcium-vitamin D (CALCIUM 500+D) 500-200 MG-UNIT per tablet Take 2 tablets by mouth daily.    . hydrochlorothiazide (HYDRODIURIL) 12.5 MG tablet hydrochlorothiazide 12.5 mg tablet  TAKE 1 TABLET BY MOUTH EVERY DAY    . HYDROcodone-acetaminophen (NORCO) 5-325 MG tablet 1/2 to 1 Q 4 hours prn pain 30 tablet 0  . leuprolide (LUPRON) 22.5 MG injection Inject 22.5 mg into the muscle every 4 (four) months.     . lidocaine-prilocaine (EMLA) cream Apply 1 application topically as needed. (Patient taking differently: Apply 1 application topically as needed. ) 30 g 0  . losartan (COZAAR)  100 MG tablet losartan 100 mg tablet  TAKE 1 TABLET BY MOUTH EVERY DAY    . predniSONE (DELTASONE) 5 MG tablet TAKE 1 TABLET BY MOUTH EVERY DAY WITH BREAKFAST 30 tablet 0  . prochlorperazine (COMPAZINE) 10 MG tablet Take 1 tablet (10 mg total) by mouth every 6 (six) hours as needed for nausea or vomiting. 30 tablet 0  . tamsulosin (FLOMAX) 0.4 MG CAPS capsule tamsulosin 0.4 mg capsule     No current facility-administered medications  for this visit.      Allergies:  Allergies  Allergen Reactions  . Lanolin Itching and Rash    Past Medical History, Surgical history, Social history updated today without any changes.   Physical Exam:    Blood pressure 136/81, pulse 82, temperature 98.9 F (37.2 C), temperature source Oral, resp. rate 18, height 5\' 9"  (1.753 m), weight 246 lb 1.6 oz (111.6 kg), SpO2 99 %.      ECOG: 1   General appearance: Alert, awake without any distress. Head: Atraumatic without abnormalities Oropharynx: Without any thrush or ulcers. Eyes: No scleral icterus. Lymph nodes: No lymphadenopathy noted in the cervical, supraclavicular, or axillary nodes Heart:regular rate and rhythm, without any murmurs or gallops.   Lung: Clear to auscultation without any rhonchi, wheezes or dullness to percussion. Abdomin: Soft, nontender without any shifting dullness or ascites. Musculoskeletal: No clubbing or cyanosis. Neurological: No motor or sensory deficits. Skin: No rashes or lesions.         Lab Results: Lab Results  Component Value Date   WBC 5.1 09/05/2018   HGB 11.4 (L) 09/05/2018   HCT 36.0 (L) 09/05/2018   MCV 88.2 09/05/2018   PLT 204 09/05/2018     Chemistry      Component Value Date/Time   NA 140 09/05/2018 0845   NA 144 06/08/2017 1423   K 3.7 09/05/2018 0845   K 3.4 (L) 06/08/2017 1423   CL 106 09/05/2018 0845   CO2 22 09/05/2018 0845   CO2 24 06/08/2017 1423   BUN 21 (H) 09/05/2018 0845   BUN 16.1 06/08/2017 1423   CREATININE 1.20 09/05/2018 0845   CREATININE 1.0 06/08/2017 1423      Component Value Date/Time   CALCIUM 9.2 09/05/2018 0845   CALCIUM 9.6 06/08/2017 1423   ALKPHOS 104 09/05/2018 0845   ALKPHOS 128 06/08/2017 1423   AST 17 09/05/2018 0845   AST 17 06/08/2017 1423   ALT 18 09/05/2018 0845   ALT 12 06/08/2017 1423   BILITOT 0.6 09/05/2018 0845   BILITOT 0.74 06/08/2017 1423        Results for Wilbon, RAVEN HARMES (MRN [id number]) as of  09/13/2018 13:38  Ref. Range 07/10/2018 08:53 09/05/2018 09:52  Prostate Specific Ag, Serum Latest Ref Range: 0.0 - 4.0 ng/mL 304.0 (H) 745.0 (H)   EXAM: CT CHEST, ABDOMEN, AND PELVIS WITH CONTRAST  TECHNIQUE: Multidetector CT imaging of the chest, abdomen and pelvis was performed following the standard protocol during bolus administration of intravenous contrast.  CONTRAST:  121mL OMNIPAQUE IOHEXOL 300 MG/ML  SOLN  COMPARISON:  03/07/2018  FINDINGS: CT CHEST FINDINGS  Cardiovascular: Heart is normal in size.  No pericardial effusion.  No evidence of thoracic aortic aneurysm. Atherosclerotic calcifications of the aortic arch.  Three vessel coronary atherosclerosis.  Right chest port terminates in the lower SVC.  Mediastinum/Nodes: 6 mm short axis subcarinal node, technically within normal limits. 60 mm short axis right hilar node (series 2/image 27), enlarged, new.  Visualized thyroid is  unremarkable.  Lungs/Pleura: Linear scarring in the anterior right upper lobe.  Linear scarring in the right lower lobe.  4 mm subpleural nodule in the anterior right middle lobe (series 4/image 91), minimally progressive.  No focal consolidation.  No pleural effusion or pneumothorax.  Musculoskeletal: Multifocal osseous metastases throughout the visualized axial and appendicular skeleton, including dominant lesions in the right anterolateral 5th rib and right T8 vertebral body (series 2/image 36). While the dominant lesions are unchanged, when looking at smaller lesions (for example, the left anterolateral 5th rib), mild progression is suspected.  CT ABDOMEN PELVIS FINDINGS  Hepatobiliary: New hepatic metastases in both lobes, including a 3.4 cm lesion in segment 4A and a 3.0 cm lesion in segment 5 (series 2/image 48).  Gallbladder is unremarkable. No intrahepatic or extrahepatic ductal dilatation.  Pancreas: Within normal limits.  Spleen: Within  normal limits.  Adrenals/Urinary Tract: Adrenal glands are within normal limits.  1.6 cm right upper pole renal cyst. 9 mm nonobstructing right upper pole renal calculus. Left kidney is within normal limits. No hydronephrosis.  Bladder is within normal limits.  Stomach/Bowel: Stomach is within normal limits.  No evidence of bowel obstruction.  Normal appendix (series 2/image 36).  Vascular/Lymphatic: No evidence of abdominal aortic aneurysm.  Atherosclerotic calcifications of the abdominal aorta and branch vessels.  New retroperitoneal/pelvic lymphadenopathy, including:  --14 mm short axis aortocaval node (series 2/image 70)  --13 mm short axis left common iliac node (series 2/image 89)  --13 mm short axis left external iliac node (series 2/image 101)  Reproductive: Status post prostatectomy.  Other: No abdominopelvic ascites.  Mild fat in the left inguinal canal.  Musculoskeletal: Multifocal osseous metastases in the visualized axial and appendicular skeleton, including a dominant lesion in the right posterior iliac bone (series 2/image 98). When looking at smaller lesions, for example, along the anterior aspect of the L3 vertebral body (series 2/image 37), mild progression is suspected.  IMPRESSION: Status post prostatectomy.  Mild retroperitoneal/pelvic nodal metastases, new.  Multifocal hepatic metastases in both lobes, with index lesions as above, new.  4 mm subpleural nodule in the anterior right middle lobe, previously 3 mm, indeterminate.  Multifocal osseous metastases in the visualized axial and appendicular skeleton. Mild progression is suspected.   Impression and Plan:  58 year old man with:   1.  Advanced prostate cancer with documented disease to the bone and the liver that is currently castration-resistant.   He is currently on Xofigo although his PSA jumped rapidly and last few months and started exhibiting symptoms  of disease progression.  Staging work-up obtained on September 12, 2018 including CT scan and bone scan are personally reviewed today and discussed with the patient and his wife.  Imaging studies were also reviewed by the patient and his wife.  He appears to have developed metastatic disease to the liver as well as worsening lymphadenopathy and bone disease.  Given these findings, I have recommended proceeding with systemic chemotherapy as soon as possible.  Jevtana chemotherapy would be the treatment of choice at this time.  Complication associated with this treatment including nausea, vomiting, myelosuppression, peripheral neuropathy as well as diarrhea.  Additional complications include infusion related issues neutropenic sepsis were also a possibility.  The benefit would be reduction of his disease progression and palliation of symptoms.  After discussion today he is agreeable to proceed and will discontinue Xofigo.   2. Androgen depravation: His next Lupron will be scheduled in July 2020.   3. Bone directed therapy: Delton See was discontinued  based on recommendation.  Calcium and vitamin D supplements to continue.  4.  IV access: Port-A-Cath will be used for subsequent chemotherapy cycles.  5.  Goals of care: His performance status remains reasonable and aggressive therapy is warranted.  6. Follow-up: We will be in 1 week for the start of chemotherapy.  25  minutes was spent with the patient face-to-face today.  More than 50% of time was dedicated to reviewing imaging studies, disease status update, treatment options, complications related to therapy and overall prognosis.  Zola Button, MD 3/19/20202:25 PM

## 2018-09-13 NOTE — Telephone Encounter (Signed)
Gave avs and calendar. Informed will call once 3/27 appt is scheduled

## 2018-09-13 NOTE — Progress Notes (Signed)
DISCONTINUE ON PATHWAY REGIMEN - Prostate     A cycle is every 21 days:     Docetaxel      Prednisone   **Always confirm dose/schedule in your pharmacy ordering system**  REASON: Disease Progression PRIOR TREATMENT: POS37: Docetaxel 75 mg/m2 q21 Days + Prednisone 5 mg BID Until Progression or Toxicity TREATMENT RESPONSE: Partial Response (PR)  START ON PATHWAY REGIMEN - Prostate     A cycle is every 21 days.:     Cabazitaxel      Prednisone   **Always confirm dose/schedule in your pharmacy ordering system**  Patient Characteristics: Adenocarcinoma, Distant Metastases, Castration Resistant, Symptomatic, Prior Docetaxel/Docetaxel Ineligible Histology: Adenocarcinoma Therapeutic Status: Distant Metastases  Intent of Therapy: Non-Curative / Palliative Intent, Discussed with Patient 

## 2018-09-13 NOTE — Telephone Encounter (Signed)
Called patient to remind of lab and weight appt. for 09-14-18 @ 12:15 pm @ [facility], spoke with patient and he is aware of this appt.

## 2018-09-14 ENCOUNTER — Ambulatory Visit: Payer: 59

## 2018-09-14 ENCOUNTER — Telehealth: Payer: Self-pay | Admitting: Oncology

## 2018-09-14 NOTE — Telephone Encounter (Signed)
Called to inform pt of 3/27 infusion appt. Confirmed with wife date and time

## 2018-09-17 ENCOUNTER — Other Ambulatory Visit: Payer: Self-pay | Admitting: Oncology

## 2018-09-17 DIAGNOSIS — C61 Malignant neoplasm of prostate: Secondary | ICD-10-CM

## 2018-09-21 ENCOUNTER — Other Ambulatory Visit: Payer: Self-pay

## 2018-09-21 ENCOUNTER — Inpatient Hospital Stay: Payer: 59

## 2018-09-21 ENCOUNTER — Ambulatory Visit (HOSPITAL_COMMUNITY): Payer: 59

## 2018-09-21 VITALS — BP 118/75 | HR 72 | Temp 97.7°F | Resp 18 | Wt 240.5 lb

## 2018-09-21 DIAGNOSIS — C61 Malignant neoplasm of prostate: Secondary | ICD-10-CM

## 2018-09-21 DIAGNOSIS — Z5111 Encounter for antineoplastic chemotherapy: Secondary | ICD-10-CM | POA: Diagnosis not present

## 2018-09-21 DIAGNOSIS — Z95828 Presence of other vascular implants and grafts: Secondary | ICD-10-CM

## 2018-09-21 LAB — CBC WITH DIFFERENTIAL (CANCER CENTER ONLY)
Abs Immature Granulocytes: 0.15 10*3/uL — ABNORMAL HIGH (ref 0.00–0.07)
Basophils Absolute: 0 10*3/uL (ref 0.0–0.1)
Basophils Relative: 1 %
Eosinophils Absolute: 0 10*3/uL (ref 0.0–0.5)
Eosinophils Relative: 1 %
HEMATOCRIT: 39.1 % (ref 39.0–52.0)
HEMOGLOBIN: 12.4 g/dL — AB (ref 13.0–17.0)
Immature Granulocytes: 2 %
LYMPHS PCT: 9 %
Lymphs Abs: 0.7 10*3/uL (ref 0.7–4.0)
MCH: 28.2 pg (ref 26.0–34.0)
MCHC: 31.7 g/dL (ref 30.0–36.0)
MCV: 88.9 fL (ref 80.0–100.0)
Monocytes Absolute: 0.5 10*3/uL (ref 0.1–1.0)
Monocytes Relative: 6 %
Neutro Abs: 6.5 10*3/uL (ref 1.7–7.7)
Neutrophils Relative %: 81 %
Platelet Count: 165 10*3/uL (ref 150–400)
RBC: 4.4 MIL/uL (ref 4.22–5.81)
RDW: 16.1 % — ABNORMAL HIGH (ref 11.5–15.5)
WBC Count: 7.9 10*3/uL (ref 4.0–10.5)
nRBC: 0 % (ref 0.0–0.2)

## 2018-09-21 LAB — CMP (CANCER CENTER ONLY)
ALT: 20 U/L (ref 0–44)
AST: 18 U/L (ref 15–41)
Albumin: 3.7 g/dL (ref 3.5–5.0)
Alkaline Phosphatase: 208 U/L — ABNORMAL HIGH (ref 38–126)
Anion gap: 14 (ref 5–15)
BUN: 19 mg/dL (ref 6–20)
CO2: 23 mmol/L (ref 22–32)
Calcium: 9.2 mg/dL (ref 8.9–10.3)
Chloride: 105 mmol/L (ref 98–111)
Creatinine: 1.46 mg/dL — ABNORMAL HIGH (ref 0.61–1.24)
GFR, Est AFR Am: >60 mL/min (ref >60)
GFR, Estimated: 52 mL/min — ABNORMAL LOW (ref >60)
Glucose, Bld: 125 mg/dL — ABNORMAL HIGH (ref 70–99)
Potassium: 3.9 mmol/L (ref 3.5–5.1)
SODIUM: 142 mmol/L (ref 135–145)
Total Bilirubin: 0.7 mg/dL (ref 0.3–1.2)
Total Protein: 7.3 g/dL (ref 6.5–8.1)

## 2018-09-21 MED ORDER — SODIUM CHLORIDE 0.9 % IV SOLN
20.0000 mg/m2 | Freq: Once | INTRAVENOUS | Status: AC
  Administered 2018-09-21: 47 mg via INTRAVENOUS
  Filled 2018-09-21: qty 4.7

## 2018-09-21 MED ORDER — HEPARIN SOD (PORK) LOCK FLUSH 100 UNIT/ML IV SOLN
500.0000 [IU] | Freq: Once | INTRAVENOUS | Status: AC | PRN
  Administered 2018-09-21: 500 [IU]
  Filled 2018-09-21: qty 5

## 2018-09-21 MED ORDER — SODIUM CHLORIDE 0.9 % IV SOLN
Freq: Once | INTRAVENOUS | Status: AC
  Administered 2018-09-21: 14:00:00 via INTRAVENOUS
  Filled 2018-09-21: qty 250

## 2018-09-21 MED ORDER — DEXAMETHASONE SODIUM PHOSPHATE 10 MG/ML IJ SOLN
10.0000 mg | Freq: Once | INTRAMUSCULAR | Status: AC
  Administered 2018-09-21: 10 mg via INTRAVENOUS

## 2018-09-21 MED ORDER — DIPHENHYDRAMINE HCL 50 MG/ML IJ SOLN
25.0000 mg | Freq: Once | INTRAMUSCULAR | Status: AC
  Administered 2018-09-21: 25 mg via INTRAVENOUS

## 2018-09-21 MED ORDER — DIPHENHYDRAMINE HCL 50 MG/ML IJ SOLN
INTRAMUSCULAR | Status: AC
  Filled 2018-09-21: qty 1

## 2018-09-21 MED ORDER — SODIUM CHLORIDE 0.9% FLUSH
10.0000 mL | Freq: Once | INTRAVENOUS | Status: AC
  Administered 2018-09-21: 10 mL
  Filled 2018-09-21: qty 10

## 2018-09-21 MED ORDER — SODIUM CHLORIDE 0.9% FLUSH
10.0000 mL | INTRAVENOUS | Status: DC | PRN
  Administered 2018-09-21: 10 mL
  Filled 2018-09-21: qty 10

## 2018-09-21 MED ORDER — DEXAMETHASONE SODIUM PHOSPHATE 10 MG/ML IJ SOLN
INTRAMUSCULAR | Status: AC
  Filled 2018-09-21: qty 1

## 2018-09-21 MED ORDER — SODIUM CHLORIDE 0.9 % IV SOLN
20.0000 mg | Freq: Once | INTRAVENOUS | Status: AC
  Administered 2018-09-21: 20 mg via INTRAVENOUS
  Filled 2018-09-21: qty 2

## 2018-09-21 NOTE — Patient Instructions (Signed)
[facility] Discharge Instructions for Patients Receiving Chemotherapy  Today you received the following chemotherapy agents: Jevtana.  To help prevent nausea and vomiting after your treatment, we encourage you to take your nausea medication as directed.   If you develop nausea and vomiting that is not controlled by your nausea medication, call the clinic.   BELOW ARE SYMPTOMS THAT SHOULD BE REPORTED IMMEDIATELY:  *FEVER GREATER THAN 100.5 F  *CHILLS WITH OR WITHOUT FEVER  NAUSEA AND VOMITING THAT IS NOT CONTROLLED WITH YOUR NAUSEA MEDICATION  *UNUSUAL SHORTNESS OF BREATH  *UNUSUAL BRUISING OR BLEEDING  TENDERNESS IN MOUTH AND THROAT WITH OR WITHOUT PRESENCE OF ULCERS  *URINARY PROBLEMS  *BOWEL PROBLEMS  UNUSUAL RASH Items with * indicate a potential emergency and should be followed up as soon as possible.  Feel free to call the clinic should you have any questions or concerns. The clinic phone number is [phone].  Please show the [facility] at check-in to the Emergency Department and triage nurse. Cabazitaxel injection What is this medicine? CABAZITAXEL (ka baz i TAX el) is a chemotherapy drug. It is used to treat prostate cancer. It targets fast dividing cells, like cancer cells, and causes these cells to die. This medicine may be used for other purposes; ask your health care provider or pharmacist if you have questions. COMMON BRAND NAME(S): Jevtana What should I tell my health care provider before I take this medicine? They need to know if you have any of these conditions: -history of stomach bleeding -kidney disease -liver disease -low blood counts, like low white cell, platelet, or red cell counts -lung or breathing disease, like asthma -recent or ongoing radiation therapy -take medicines that treat or prevent blood clots -an unusual or allergic reaction to cabazitaxel, polysorbate 80, other medicines, foods, dyes, or  preservatives -pregnant or trying to get pregnant -breast-feeding How should I use this medicine? This medicine is for infusion into a vein. It is given by a health care professional in a hospital or clinic setting. Talk to your pediatrician regarding the use of this medicine in children. Special care may be needed. Overdosage: If you think you have taken too much of this medicine contact a poison control center or emergency room at once. NOTE: This medicine is only for you. Do not share this medicine with others. What if I miss a dose? It is important not to miss your dose. Call your doctor or health care professional if you are unable to keep an appointment. What may interact with this medicine? -antiviral medicines for HIV or AIDS -clarithromycin -medicines for fungal infections like ketoconazole, fluconazole, itraconazole, and voriconazole -nefazodone -telithromycin This list may not describe all possible interactions. Give your health care provider a list of all the medicines, herbs, non-prescription drugs, or dietary supplements you use. Also tell them if you smoke, drink alcohol, or use illegal drugs. Some items may interact with your medicine. What should I watch for while using this medicine? Your condition will be monitored carefully while you are receiving this medicine. This drug may make you feel generally unwell. This is not uncommon, as chemotherapy can affect healthy cells as well as cancer cells. Report any side effects. Continue your course of treatment even though you feel ill unless your doctor tells you to stop. Call your doctor or health care professional for advice if you get a fever, chills or sore throat, or other symptoms of a cold or flu. Do not treat yourself. This drug  decreases your body's ability to fight infections. Try to avoid being around people who are sick. This medicine may increase your risk to bruise or bleed. Call your doctor or health care professional  if you notice any unusual bleeding. Be careful brushing and flossing your teeth or using a toothpick because you may get an infection or bleed more easily. If you have any dental work done, tell your dentist you are receiving this medicine. Avoid taking products that contain aspirin, acetaminophen, ibuprofen, naproxen, or ketoprofen unless instructed by your doctor. These medicines may hide a fever. Do not become pregnant while taking this medicine. Women should inform their doctor if they wish to become pregnant or think they might be pregnant. Men should not father a child while taking this medicine and for 3 months after stopping it. There is a potential for serious side effects to an unborn child. Talk to your health care professional or pharmacist for more information. Do not breast-feed an infant while taking this medicine. What side effects may I notice from receiving this medicine? Side effects that you should report to your doctor or health care professional as soon as possible: -allergic reactions like skin rash, itching or hives, swelling of the face, lips, or tongue -blood in the urine -breathing problems -constipation -dark urine -diarrhea -pain in the lower back or side -pain, tingling, numbness in the hands or feet -pain when urinating -severe abdominal pain -signs of infection - fever or chills, cough, sore throat, pain or difficulty passing urine -signs and symptoms of kidney injury like trouble passing urine or change in the amount of urine -signs of decreased platelets or bleeding - bruising, pinpoint red spots on the skin, black, tarry stools, blood in the urine -signs of decreased red blood cells - unusually weak or tired, fainting spells, lightheadedness -vomiting Side effects that usually do not require medical attention (report to your doctor or health care professional if they continue or are bothersome): -back pain -change in taste -hair loss -headache -loss of  appetite -muscle or joint pain -nausea -upset stomach This list may not describe all possible side effects. Call your doctor for medical advice about side effects. You may report side effects to FDA at 1-800-FDA-1088. Where should I keep my medicine? This drug is given in a hospital or clinic and will not be stored at home. NOTE: This sheet is a summary. It may not cover all possible information. If you have questions about this medicine, talk to your doctor, pharmacist, or health care provider.  2019 Elsevier/Gold Standard (2016-07-08 15:38:47)

## 2018-09-22 LAB — PROSTATE-SPECIFIC AG, SERUM (LABCORP): Prostate Specific Ag, Serum: 1172 ng/mL — ABNORMAL HIGH (ref 0.0–4.0)

## 2018-09-24 ENCOUNTER — Other Ambulatory Visit: Payer: Self-pay

## 2018-09-24 ENCOUNTER — Inpatient Hospital Stay: Payer: 59

## 2018-09-24 VITALS — BP 120/77 | HR 70 | Temp 98.1°F | Resp 18

## 2018-09-24 DIAGNOSIS — Z95828 Presence of other vascular implants and grafts: Secondary | ICD-10-CM

## 2018-09-24 DIAGNOSIS — Z5111 Encounter for antineoplastic chemotherapy: Secondary | ICD-10-CM | POA: Diagnosis not present

## 2018-09-24 DIAGNOSIS — C61 Malignant neoplasm of prostate: Secondary | ICD-10-CM

## 2018-09-24 MED ORDER — PEGFILGRASTIM-CBQV 6 MG/0.6ML ~~LOC~~ SOSY
6.0000 mg | PREFILLED_SYRINGE | Freq: Once | SUBCUTANEOUS | Status: AC
  Administered 2018-09-24: 6 mg via SUBCUTANEOUS

## 2018-09-24 MED ORDER — LEUPROLIDE ACETATE (4 MONTH) 30 MG IM KIT: 30.0000 mg | PACK | Freq: Once | INTRAMUSCULAR | Status: DC

## 2018-09-24 MED ORDER — PEGFILGRASTIM-CBQV 6 MG/0.6ML ~~LOC~~ SOSY
PREFILLED_SYRINGE | SUBCUTANEOUS | Status: AC
  Filled 2018-09-24: qty 0.6

## 2018-09-28 ENCOUNTER — Other Ambulatory Visit: Payer: 59

## 2018-10-01 ENCOUNTER — Ambulatory Visit: Payer: 59 | Admitting: Oncology

## 2018-10-11 ENCOUNTER — Telehealth: Payer: Self-pay

## 2018-10-11 NOTE — Telephone Encounter (Signed)
Neulasta Onpro Patient Outreach Note  Patient was contacted on 10/11/2018 in regards to switching G-CSF therapy to Neulasta Onpro ([email or web address]). Patient was educated on the purpose of this proposed change in therapy due to COVID-19 pandemic. Patient was educated about Neulasta Onpro on-body injector and patient will be provided with an educational video while in infusion on their next scheduled date if changed to Neulasta Onpro.   [x]  Patient agrees to change in therapy. Begin process to change to Neulasta Onpro therapy.  []  Patient does not agree to change in therapy. No change to Neulasta Onpro at this time.    Thank You,  Egbert Garibaldi  10/11/2018 2:56 PM

## 2018-10-12 ENCOUNTER — Other Ambulatory Visit: Payer: Self-pay

## 2018-10-12 ENCOUNTER — Telehealth: Payer: Self-pay | Admitting: Oncology

## 2018-10-12 ENCOUNTER — Inpatient Hospital Stay: Payer: 59

## 2018-10-12 ENCOUNTER — Inpatient Hospital Stay: Payer: 59 | Attending: Oncology | Admitting: Oncology

## 2018-10-12 VITALS — BP 115/72 | HR 100 | Temp 97.4°F | Resp 17 | Wt 235.2 lb

## 2018-10-12 DIAGNOSIS — Z5111 Encounter for antineoplastic chemotherapy: Secondary | ICD-10-CM | POA: Diagnosis not present

## 2018-10-12 DIAGNOSIS — C61 Malignant neoplasm of prostate: Secondary | ICD-10-CM

## 2018-10-12 DIAGNOSIS — C787 Secondary malignant neoplasm of liver and intrahepatic bile duct: Secondary | ICD-10-CM

## 2018-10-12 DIAGNOSIS — Z79899 Other long term (current) drug therapy: Secondary | ICD-10-CM | POA: Diagnosis not present

## 2018-10-12 DIAGNOSIS — Z95828 Presence of other vascular implants and grafts: Secondary | ICD-10-CM

## 2018-10-12 LAB — CBC WITH DIFFERENTIAL (CANCER CENTER ONLY)
Abs Immature Granulocytes: 0.65 10*3/uL — ABNORMAL HIGH (ref 0.00–0.07)
Basophils Absolute: 0.1 10*3/uL (ref 0.0–0.1)
Basophils Relative: 1 %
Eosinophils Absolute: 0 10*3/uL (ref 0.0–0.5)
Eosinophils Relative: 0 %
HCT: 34.5 % — ABNORMAL LOW (ref 39.0–52.0)
Hemoglobin: 11.1 g/dL — ABNORMAL LOW (ref 13.0–17.0)
Immature Granulocytes: 6 %
Lymphocytes Relative: 9 %
Lymphs Abs: 1 10*3/uL (ref 0.7–4.0)
MCH: 28.5 pg (ref 26.0–34.0)
MCHC: 32.2 g/dL (ref 30.0–36.0)
MCV: 88.5 fL (ref 80.0–100.0)
Monocytes Absolute: 0.8 10*3/uL (ref 0.1–1.0)
Monocytes Relative: 6 %
Neutro Abs: 9.3 10*3/uL — ABNORMAL HIGH (ref 1.7–7.7)
Neutrophils Relative %: 78 %
Platelet Count: 169 10*3/uL (ref 150–400)
RBC: 3.9 MIL/uL — ABNORMAL LOW (ref 4.22–5.81)
RDW: 17.7 % — ABNORMAL HIGH (ref 11.5–15.5)
WBC Count: 11.8 10*3/uL — ABNORMAL HIGH (ref 4.0–10.5)
nRBC: 0.8 % — ABNORMAL HIGH (ref 0.0–0.2)

## 2018-10-12 LAB — CMP (CANCER CENTER ONLY)
ALT: 19 U/L (ref 0–44)
AST: 31 U/L (ref 15–41)
Albumin: 3.7 g/dL (ref 3.5–5.0)
Alkaline Phosphatase: 194 U/L — ABNORMAL HIGH (ref 38–126)
Anion gap: 13 (ref 5–15)
BUN: 13 mg/dL (ref 6–20)
CO2: 21 mmol/L — ABNORMAL LOW (ref 22–32)
Calcium: 9.1 mg/dL (ref 8.9–10.3)
Chloride: 110 mmol/L (ref 98–111)
Creatinine: 1.8 mg/dL — ABNORMAL HIGH (ref 0.61–1.24)
GFR, Est AFR Am: 47 mL/min — ABNORMAL LOW (ref >60)
GFR, Estimated: 41 mL/min — ABNORMAL LOW (ref >60)
Glucose, Bld: 134 mg/dL — ABNORMAL HIGH (ref 70–99)
Potassium: 3.4 mmol/L — ABNORMAL LOW (ref 3.5–5.1)
Sodium: 144 mmol/L (ref 135–145)
Total Bilirubin: 0.9 mg/dL (ref 0.3–1.2)
Total Protein: 6.7 g/dL (ref 6.5–8.1)

## 2018-10-12 MED ORDER — PEGFILGRASTIM 6 MG/0.6ML ~~LOC~~ PSKT
PREFILLED_SYRINGE | SUBCUTANEOUS | Status: AC
  Filled 2018-10-12: qty 0.6

## 2018-10-12 MED ORDER — SODIUM CHLORIDE 0.9 % IV SOLN
20.0000 mg/m2 | Freq: Once | INTRAVENOUS | Status: AC
  Administered 2018-10-12: 47 mg via INTRAVENOUS
  Filled 2018-10-12: qty 4.7

## 2018-10-12 MED ORDER — SODIUM CHLORIDE 0.9% FLUSH
10.0000 mL | Freq: Once | INTRAVENOUS | Status: AC
  Administered 2018-10-12: 10 mL
  Filled 2018-10-12: qty 10

## 2018-10-12 MED ORDER — PEGFILGRASTIM 6 MG/0.6ML ~~LOC~~ PSKT
6.0000 mg | PREFILLED_SYRINGE | Freq: Once | SUBCUTANEOUS | Status: AC
  Administered 2018-10-12: 6 mg via SUBCUTANEOUS

## 2018-10-12 MED ORDER — SODIUM CHLORIDE 0.9% FLUSH
10.0000 mL | INTRAVENOUS | Status: DC | PRN
  Administered 2018-10-12: 15:00:00 10 mL
  Filled 2018-10-12: qty 10

## 2018-10-12 MED ORDER — DIPHENHYDRAMINE HCL 50 MG/ML IJ SOLN
INTRAMUSCULAR | Status: AC
  Filled 2018-10-12: qty 1

## 2018-10-12 MED ORDER — DEXAMETHASONE SODIUM PHOSPHATE 10 MG/ML IJ SOLN
10.0000 mg | Freq: Once | INTRAMUSCULAR | Status: AC
  Administered 2018-10-12: 10 mg via INTRAVENOUS

## 2018-10-12 MED ORDER — DIPHENHYDRAMINE HCL 50 MG/ML IJ SOLN
25.0000 mg | Freq: Once | INTRAMUSCULAR | Status: AC
  Administered 2018-10-12: 12:00:00 25 mg via INTRAVENOUS

## 2018-10-12 MED ORDER — DEXAMETHASONE SODIUM PHOSPHATE 10 MG/ML IJ SOLN
INTRAMUSCULAR | Status: AC
  Filled 2018-10-12: qty 1

## 2018-10-12 MED ORDER — SODIUM CHLORIDE 0.9 % IV SOLN
Freq: Once | INTRAVENOUS | Status: AC
  Administered 2018-10-12: 12:00:00 via INTRAVENOUS
  Filled 2018-10-12: qty 250

## 2018-10-12 MED ORDER — HEPARIN SOD (PORK) LOCK FLUSH 100 UNIT/ML IV SOLN
500.0000 [IU] | Freq: Once | INTRAVENOUS | Status: AC | PRN
  Administered 2018-10-12: 15:00:00 500 [IU]
  Filled 2018-10-12: qty 5

## 2018-10-12 MED ORDER — PROCHLORPERAZINE MALEATE 10 MG PO TABS: 10.0000 mg | ORAL_TABLET | Freq: Four times a day (QID) | ORAL | 3 refills | Status: DC | PRN

## 2018-10-12 MED ORDER — FAMOTIDINE IN NACL 20-0.9 MG/50ML-% IV SOLN: 20.0000 mg | Freq: Once | INTRAVENOUS | Status: DC

## 2018-10-12 MED ORDER — SODIUM CHLORIDE 0.9 % IV SOLN
20.0000 mg | Freq: Once | INTRAVENOUS | Status: AC
  Administered 2018-10-12: 20 mg via INTRAVENOUS
  Filled 2018-10-12: qty 2

## 2018-10-12 NOTE — Progress Notes (Signed)
Per Dr. Alen Blew okay to treat with Crt 1.80

## 2018-10-12 NOTE — Progress Notes (Signed)
Hematology and Oncology Follow Up Visit  CYNTHIA STAINBACK [id number] 30-Dec-1960 58 y.o. 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  He presented with  Gleason score 4+5 = 9 in 2012 with documented hepatic metastasis in March 2020.   Prior Therapy:  He is S/P radical prostatectomy on 04/18/2011 under the care of Dr. Alinda Money. The final pathological staging was T3b N1 with 9 positive lymph nodes out of 11 examined.   He continued to have persistent PSA and was treated with androgen deprivation. His PSA became undetectable until August 2014.    In June 2016 PSA went up to 2.0 and subsequently to 5.6 in November 2016. Staging workup including a bone scan done on 05/15/2015 which showed a focus of increased uptake in the posterior parietal occipital region of the skull.   Xtandi started on 06/10/2015.  Therapy discontinued in September 2018 because of progression of disease.  Zytiga 1000 mg daily with prednisone at 5 mg daily started in October 2018.  Therapy discontinued in January 2019 because of poor response.  Taxotere chemotherapy started on 08/04/2017. He received a 75 mg/m on cycle 1.  He is receiving subsequent cycles at 60 mg/m.  He is status post 10 cycles of therapy concluded in August 2019.   Xofigo monthly injections to starting October 2019.  He completed 5 months of therapy in February 2020.   Current therapy:   Androgen deprivation utilizing Lupron 30 mg every 4 months.    Jevtana chemotherapy started on September 21, 2018.  He is here for cycle 2 of therapy.  Interim History: Mr. Catala returns today for a repeat evaluation.  Since the last visit, he received the first cycle of Jevtana without any major complications.  He does report some fatigue and tiredness but no vomiting or infusion related complications.  He does report some mild nausea manageable  with Compazine.  Performance status and quality of life slightly declining but for the most part maintaining reasonable quality of life.  No recent hospitalization or illnesses.   He denied any alteration mental status, neuropathy, confusion or dizziness.  Denies any headaches or lethargy.  Denies any night sweats, weight loss or changes in appetite.  Denied orthopnea, dyspnea on exertion or chest discomfort.  Denies shortness of breath, difficulty breathing hemoptysis or cough.  Denies any abdominal distention, nausea, early satiety or dyspepsia.  Denies any hematuria, frequency, dysuria or nocturia.  Denies any skin irritation, dryness or rash.  Denies any ecchymosis or petechiae.  Denies any lymphadenopathy or clotting.  Denies any heat or cold intolerance.  Denies any anxiety or depression.  Remaining review of system is negative.           Medications: I have reviewed the patient's current medications.  Current Outpatient Medications  Medication Sig Dispense Refill  . calcium-vitamin D (CALCIUM 500+D) 500-200 MG-UNIT per tablet Take 2 tablets by mouth daily.    . hydrochlorothiazide (HYDRODIURIL) 12.5 MG tablet hydrochlorothiazide 12.5 mg tablet  TAKE 1 TABLET BY MOUTH EVERY DAY    . HYDROcodone-acetaminophen (NORCO) 5-325 MG tablet 1/2 to 1 Q 4 hours prn pain 30 tablet 0  . leuprolide (LUPRON) 22.5 MG injection Inject 22.5 mg into the muscle every 4 (four) months.     . lidocaine-prilocaine (EMLA) cream Apply 1 application topically as needed. (Patient taking differently: Apply 1 application topically as needed. ) 30 g 0  .  losartan (COZAAR) 100 MG tablet losartan 100 mg tablet  TAKE 1 TABLET BY MOUTH EVERY DAY    . predniSONE (DELTASONE) 5 MG tablet TAKE 1 TABLET BY MOUTH EVERY DAY WITH BREAKFAST 30 tablet 0  . prochlorperazine (COMPAZINE) 10 MG tablet Take 1 tablet (10 mg total) by mouth every 6 (six) hours as needed for nausea or vomiting. 30 tablet 0  . tamsulosin (FLOMAX) 0.4 MG  CAPS capsule tamsulosin 0.4 mg capsule     No current facility-administered medications for this visit.      Allergies:  Allergies  Allergen Reactions  . Lanolin Itching and Rash    Past Medical History, Surgical history, Social history updated today without any changes.   Physical Exam:     There were no vitals taken for this visit.      ECOG: 1     General appearance: Comfortable appearing without any discomfort Head: Normocephalic without any trauma Oropharynx: Mucous membranes are moist and pink without any thrush or ulcers. Eyes: Pupils are equal and round reactive to light. Lymph nodes: No cervical, supraclavicular, inguinal or axillary lymphadenopathy.   Heart:regular rate and rhythm.  S1 and S2 without leg edema. Lung: Clear without any rhonchi or wheezes.  No dullness to percussion. Abdomin: Soft, nontender, nondistended with good bowel sounds.  No hepatosplenomegaly. Musculoskeletal: No joint deformity or effusion.  Full range of motion noted. Neurological: No deficits noted on motor, sensory and deep tendon reflex exam. Skin: No petechial rash or dryness.  Appeared moist.           Lab Results: Lab Results  Component Value Date   WBC 7.9 09/21/2018   HGB 12.4 (L) 09/21/2018   HCT 39.1 09/21/2018   MCV 88.9 09/21/2018   PLT 165 09/21/2018     Chemistry      Component Value Date/Time   NA 142 09/21/2018 1219   NA 144 06/08/2017 1423   K 3.9 09/21/2018 1219   K 3.4 (L) 06/08/2017 1423   CL 105 09/21/2018 1219   CO2 23 09/21/2018 1219   CO2 24 06/08/2017 1423   BUN 19 09/21/2018 1219   BUN 16.1 06/08/2017 1423   CREATININE 1.46 (H) 09/21/2018 1219   CREATININE 1.0 06/08/2017 1423      Component Value Date/Time   CALCIUM 9.2 09/21/2018 1219   CALCIUM 9.6 06/08/2017 1423   ALKPHOS 208 (H) 09/21/2018 1219   ALKPHOS 128 06/08/2017 1423   AST 18 09/21/2018 1219   AST 17 06/08/2017 1423   ALT 20 09/21/2018 1219   ALT 12 06/08/2017  1423   BILITOT 0.7 09/21/2018 1219   BILITOT 0.74 06/08/2017 1423         Impression and Plan:  58 year old man with:   1.  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     He is status post a first cycle of Jevtana chemotherapy without any major complications.  Risks and benefits of continuing this treatment long-term was discussed today.  Complications that included fatigue, tiredness peripheral neuropathy as well as myelosuppression were reiterated.  Other options of therapy were reviewed and very limited at this time.  He is agreeable to continue at this time.   2. Androgen depravation: Long-term complication associated with Lupron were reviewed today.  These include weight gain and hot flashes.  He is agreeable to continue next injection is scheduled after July 11 of 2020.   3. Bone directed therapy: I recommended continuing calcium and vitamin  D supplements.  Delton See has been on hold for the time being due to poor tolerance.  4.  IV access: Port-A-Cath continues to be in use at this time.  No issues reported.  5.  Growth factor support: We will switch him to onpro if possible.  He is high risk of developing neutropenic sepsis.  6.  Goals of care: Therapy remains palliative although aggressive therapy is warranted given his reasonable performance status.  7. Follow-up: In 3 weeks for the next cycle of therapy.  25  minutes was spent with the patient face-to-face today.  More than 50% of time was spent on reviewing disease status update, answering questions regarding complications related to chemotherapy and planning future care options.  Zola Button, MD 4/17/202010:56 AM

## 2018-10-12 NOTE — Telephone Encounter (Signed)
Scheduled appt per 4/17 sch message.

## 2018-10-12 NOTE — Patient Instructions (Signed)
[facility] Discharge Instructions for Patients Receiving Chemotherapy  Today you received the following chemotherapy agents: Jevtana  To help prevent nausea and vomiting after your treatment, we encourage you to take your nausea medication as directed.   If you develop nausea and vomiting that is not controlled by your nausea medication, call the clinic.   BELOW ARE SYMPTOMS THAT SHOULD BE REPORTED IMMEDIATELY:  *FEVER GREATER THAN 100.5 F  *CHILLS WITH OR WITHOUT FEVER  NAUSEA AND VOMITING THAT IS NOT CONTROLLED WITH YOUR NAUSEA MEDICATION  *UNUSUAL SHORTNESS OF BREATH  *UNUSUAL BRUISING OR BLEEDING  TENDERNESS IN MOUTH AND THROAT WITH OR WITHOUT PRESENCE OF ULCERS  *URINARY PROBLEMS  *BOWEL PROBLEMS  UNUSUAL RASH Items with * indicate a potential emergency and should be followed up as soon as possible.  Feel free to call the clinic should you have any questions or concerns. The clinic phone number is [phone].  Please show the CHEMO ALERT CARD at check-in to the Emergency Department and triage nurse.   

## 2018-10-12 NOTE — Addendum Note (Signed)
Addended by: [name] on: 10/12/2018 11:30 AM   Modules accepted: Orders

## 2018-10-13 ENCOUNTER — Other Ambulatory Visit: Payer: Self-pay | Admitting: Oncology

## 2018-10-13 DIAGNOSIS — C61 Malignant neoplasm of prostate: Secondary | ICD-10-CM

## 2018-10-13 LAB — PROSTATE-SPECIFIC AG, SERUM (LABCORP): Prostate Specific Ag, Serum: 2148 ng/mL — ABNORMAL HIGH (ref 0.0–4.0)

## 2018-10-15 ENCOUNTER — Inpatient Hospital Stay: Payer: 59

## 2018-10-15 ENCOUNTER — Telehealth: Payer: Self-pay | Admitting: *Deleted

## 2018-10-15 NOTE — Telephone Encounter (Signed)
Spoke with patients wife regarding results.  Stated understanding and wanted to confirm next appts.

## 2018-10-15 NOTE — Telephone Encounter (Signed)
-----   Message from [name], MD sent at 10/15/2018  8:19 AM EDT ----- Please let him know his PSA went up. It is too early to see the benefit of chemo on his PSA yet. I anticipate it will go down next time.

## 2018-11-02 ENCOUNTER — Inpatient Hospital Stay: Payer: 59

## 2018-11-02 ENCOUNTER — Other Ambulatory Visit: Payer: Self-pay

## 2018-11-02 ENCOUNTER — Inpatient Hospital Stay: Payer: 59 | Attending: Oncology | Admitting: Oncology

## 2018-11-02 VITALS — BP 130/78 | HR 111 | Temp 98.0°F | Resp 18 | Ht 69.0 in | Wt 226.1 lb

## 2018-11-02 DIAGNOSIS — Z95828 Presence of other vascular implants and grafts: Secondary | ICD-10-CM

## 2018-11-02 DIAGNOSIS — R630 Anorexia: Secondary | ICD-10-CM

## 2018-11-02 DIAGNOSIS — R531 Weakness: Secondary | ICD-10-CM

## 2018-11-02 DIAGNOSIS — R110 Nausea: Secondary | ICD-10-CM | POA: Diagnosis not present

## 2018-11-02 DIAGNOSIS — M7918 Myalgia, other site: Secondary | ICD-10-CM

## 2018-11-02 DIAGNOSIS — M549 Dorsalgia, unspecified: Secondary | ICD-10-CM | POA: Diagnosis not present

## 2018-11-02 DIAGNOSIS — M79606 Pain in leg, unspecified: Secondary | ICD-10-CM | POA: Diagnosis not present

## 2018-11-02 DIAGNOSIS — C61 Malignant neoplasm of prostate: Secondary | ICD-10-CM | POA: Diagnosis not present

## 2018-11-02 DIAGNOSIS — C787 Secondary malignant neoplasm of liver and intrahepatic bile duct: Secondary | ICD-10-CM | POA: Diagnosis not present

## 2018-11-02 DIAGNOSIS — C7951 Secondary malignant neoplasm of bone: Secondary | ICD-10-CM | POA: Insufficient documentation

## 2018-11-02 DIAGNOSIS — Z79899 Other long term (current) drug therapy: Secondary | ICD-10-CM | POA: Insufficient documentation

## 2018-11-02 DIAGNOSIS — R634 Abnormal weight loss: Secondary | ICD-10-CM

## 2018-11-02 LAB — CMP (CANCER CENTER ONLY)
ALT: 20 U/L (ref 0–44)
AST: 49 U/L — ABNORMAL HIGH (ref 15–41)
Albumin: 3.8 g/dL (ref 3.5–5.0)
Alkaline Phosphatase: 256 U/L — ABNORMAL HIGH (ref 38–126)
Anion gap: 14 (ref 5–15)
BUN: 18 mg/dL (ref 6–20)
CO2: 18 mmol/L — ABNORMAL LOW (ref 22–32)
Calcium: 8.9 mg/dL (ref 8.9–10.3)
Chloride: 108 mmol/L (ref 98–111)
Creatinine: 1.97 mg/dL — ABNORMAL HIGH (ref 0.61–1.24)
GFR, Est AFR Am: 42 mL/min — ABNORMAL LOW (ref >60)
GFR, Estimated: 36 mL/min — ABNORMAL LOW (ref >60)
Glucose, Bld: 103 mg/dL — ABNORMAL HIGH (ref 70–99)
Potassium: 3.8 mmol/L (ref 3.5–5.1)
Sodium: 140 mmol/L (ref 135–145)
Total Bilirubin: 1.2 mg/dL (ref 0.3–1.2)
Total Protein: 6.6 g/dL (ref 6.5–8.1)

## 2018-11-02 LAB — CBC WITH DIFFERENTIAL (CANCER CENTER ONLY)
Abs Immature Granulocytes: 1.05 10*3/uL — ABNORMAL HIGH (ref 0.00–0.07)
Basophils Absolute: 0.1 10*3/uL (ref 0.0–0.1)
Basophils Relative: 1 %
Eosinophils Absolute: 0.2 10*3/uL (ref 0.0–0.5)
Eosinophils Relative: 2 %
HCT: 30.2 % — ABNORMAL LOW (ref 39.0–52.0)
Hemoglobin: 9.5 g/dL — ABNORMAL LOW (ref 13.0–17.0)
Immature Granulocytes: 7 %
Lymphocytes Relative: 6 %
Lymphs Abs: 0.9 10*3/uL (ref 0.7–4.0)
MCH: 28.4 pg (ref 26.0–34.0)
MCHC: 31.5 g/dL (ref 30.0–36.0)
MCV: 90.4 fL (ref 80.0–100.0)
Monocytes Absolute: 0.9 10*3/uL (ref 0.1–1.0)
Monocytes Relative: 6 %
Neutro Abs: 11.9 10*3/uL — ABNORMAL HIGH (ref 1.7–7.7)
Neutrophils Relative %: 78 %
Platelet Count: 87 10*3/uL — ABNORMAL LOW (ref 150–400)
RBC: 3.34 MIL/uL — ABNORMAL LOW (ref 4.22–5.81)
RDW: 19.9 % — ABNORMAL HIGH (ref 11.5–15.5)
WBC Count: 15.1 10*3/uL — ABNORMAL HIGH (ref 4.0–10.5)
nRBC: 1.3 % — ABNORMAL HIGH (ref 0.0–0.2)

## 2018-11-02 MED ORDER — HYDROCODONE-ACETAMINOPHEN 5-325 MG PO TABS: ORAL_TABLET | ORAL | 0 refills | Status: AC

## 2018-11-02 MED ORDER — HEPARIN SOD (PORK) LOCK FLUSH 100 UNIT/ML IV SOLN
500.0000 [IU] | Freq: Once | INTRAVENOUS | Status: AC
  Administered 2018-11-02: 12:00:00 500 [IU]
  Filled 2018-11-02: qty 5

## 2018-11-02 MED ORDER — SODIUM CHLORIDE 0.9% FLUSH
10.0000 mL | Freq: Once | INTRAVENOUS | Status: AC
  Administered 2018-11-02: 10 mL
  Filled 2018-11-02: qty 10

## 2018-11-02 MED ORDER — HEPARIN SOD (PORK) LOCK FLUSH 100 UNIT/ML IV SOLN
500.0000 [IU] | Freq: Once | INTRAVENOUS | Status: DC
  Filled 2018-11-02: qty 5

## 2018-11-02 MED ORDER — SODIUM CHLORIDE 0.9% FLUSH
10.0000 mL | Freq: Once | INTRAVENOUS | Status: DC
  Filled 2018-11-02: qty 10

## 2018-11-02 MED ORDER — PROCHLORPERAZINE MALEATE 10 MG PO TABS: 10.0000 mg | ORAL_TABLET | Freq: Four times a day (QID) | ORAL | 3 refills | Status: AC | PRN

## 2018-11-02 MED ORDER — LEUPROLIDE ACETATE (4 MONTH) 30 MG IM KIT: 30.0000 mg | PACK | Freq: Once | INTRAMUSCULAR | Status: DC

## 2018-11-02 NOTE — Progress Notes (Signed)
Hematology and Oncology Follow Up Visit  [name] [id number] 10-24-1960 58 y.o. 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  He presented with  Gleason score 4+5 = 9 in 2012.    Prior Therapy:  He is S/P radical prostatectomy on 04/18/2011 under the care of Dr. Alinda [name]. The final pathological staging was T3b N1 with 9 positive lymph nodes out of 11 examined.   He continued to have persistent PSA and was treated with androgen deprivation. His PSA became undetectable until August 2014.    In June 2016 PSA went up to 2.0 and subsequently to 5.6 in November 2016. Staging workup including a bone scan done on 05/15/2015 which showed a focus of increased uptake in the posterior parietal occipital region of the skull.   Xtandi started on 06/10/2015.  Therapy discontinued in September 2018 because of progression of disease.  Zytiga 1000 mg daily with prednisone at 5 mg daily started in October 2018.  Therapy discontinued in January 2019 because of poor response.  Taxotere chemotherapy started on 08/04/2017. He received a 75 mg/m on cycle 1.  He is receiving subsequent cycles at 60 mg/m.  He is status post 10 cycles of therapy concluded in August 2019.   Xofigo monthly injections to starting October 2019.  He completed 5 months of therapy in February 2020.   Current therapy:   Androgen deprivation utilizing Lupron 30 mg every 4 months.    Jevtana chemotherapy started on September 21, 2018.  He is currently receiving 20 mg per metered square and here for evaluation for cycle 3.  Interim History: [name] is here for a repeat evaluation.  Since the last visit, he received the last cycle of chemotherapy with continuous decline in his health.  He is reporting more back pain and lower extremity pain as well as failure to thrive.  He reported generalized weakness as well  as loss of appetite and continues to lose weight.  His mobility has been limited with very limited exercise tolerance.  He did report some mild nausea but no vomiting.  Hydrocodone has helped his pain and Compazine is helping with nausea.   Patient denied headaches, blurry vision, syncope or seizures.  Denies any fevers, chills or sweats.  Denied chest pain, palpitation, orthopnea or leg edema.  Denied cough, wheezing or hemoptysis.  Denied vomiting or abdominal pain.  Denies any constipation or diarrhea.  Denies any frequency urgency or hesitancy.  Denies any arthralgias or myalgias.  Denies any skin rashes or lesions.  Denies any bleeding or clotting tendency.  Denies any easy bruising.  Denies any hair or nail changes.  Denies any anxiety or depression.  Remaining review of system is negative.            Medications: I have reviewed the patient's current medications.  Current Outpatient Medications  Medication Sig Dispense Refill  . calcium-vitamin D (CALCIUM 500+D) 500-200 MG-UNIT per tablet Take 2 tablets by mouth daily.    . hydrochlorothiazide (HYDRODIURIL) 12.5 MG tablet hydrochlorothiazide 12.5 mg tablet  TAKE 1 TABLET BY MOUTH EVERY DAY    . HYDROcodone-acetaminophen (NORCO) 5-325 MG tablet 1/2 to 1 Q 4 hours prn pain 30 tablet 0  . leuprolide (LUPRON) 22.5 MG injection Inject 22.5 mg into the muscle every 4 (four) months.     . lidocaine-prilocaine (EMLA) cream Apply 1 application topically as needed. (Patient taking differently:  Apply 1 application topically as needed. ) 30 g 0  . losartan (COZAAR) 100 MG tablet losartan 100 mg tablet  TAKE 1 TABLET BY MOUTH EVERY DAY    . predniSONE (DELTASONE) 5 MG tablet TAKE 1 TABLET BY MOUTH EVERY DAY WITH BREAKFAST 30 tablet 0  . prochlorperazine (COMPAZINE) 10 MG tablet Take 1 tablet (10 mg total) by mouth every 6 (six) hours as needed for nausea or vomiting. 30 tablet 3  . tamsulosin (FLOMAX) 0.4 MG CAPS capsule tamsulosin 0.4 mg  capsule     No current facility-administered medications for this visit.    Facility-Administered Medications Ordered in Other Visits  Medication Dose Route Frequency Provider Last Rate Last Dose  . heparin lock flush 100 unit/mL  500 Units Intracatheter Once Curtis Portela, MD         Allergies:  Allergies  Allergen Reactions  . Lanolin Itching and Rash    Past Medical History, Surgical history, Social history updated today without any changes.   Physical Exam:  Blood pressure 130/78, pulse (!) 111, temperature 98 F (36.7 C), temperature source Oral, resp. rate 18, height 5\' 9"  (1.753 m), weight 226 lb 1.6 oz (102.6 kg), SpO2 99 %.      ECOG: 3    General appearance:  Chronically ill-appearing without distress. Head: Atraumatic without abnormalities Oropharynx: Without any thrush or ulcers. Eyes: No scleral icterus. Lymph nodes: No lymphadenopathy noted in the cervical, supraclavicular, or axillary nodes Heart:regular rate and rhythm, without any murmurs or gallops.   Lung: Clear to auscultation without any rhonchi, wheezes or dullness to percussion. Abdomin: Soft, nontender without any shifting dullness or ascites. Musculoskeletal: No clubbing or cyanosis. Neurological: No motor or sensory deficits. Skin: No rashes or lesions. .           Lab Results: Lab Results  Component Value Date   WBC 11.8 (H) 10/12/2018   HGB 11.1 (L) 10/12/2018   HCT 34.5 (L) 10/12/2018   MCV 88.5 10/12/2018   PLT 169 10/12/2018     Chemistry      Component Value Date/Time   NA 144 10/12/2018 1055   NA 144 06/08/2017 1423   K 3.4 (L) 10/12/2018 1055   K 3.4 (L) 06/08/2017 1423   CL 110 10/12/2018 1055   CO2 21 (L) 10/12/2018 1055   CO2 24 06/08/2017 1423   BUN 13 10/12/2018 1055   BUN 16.1 06/08/2017 1423   CREATININE 1.80 (H) 10/12/2018 1055   CREATININE 1.0 06/08/2017 1423      Component Value Date/Time   CALCIUM 9.1 10/12/2018 1055   CALCIUM 9.6 06/08/2017  1423   ALKPHOS 194 (H) 10/12/2018 1055   ALKPHOS 128 06/08/2017 1423   AST 31 10/12/2018 1055   AST 17 06/08/2017 1423   ALT 19 10/12/2018 1055   ALT 12 06/08/2017 1423   BILITOT 0.9 10/12/2018 1055   BILITOT 0.74 06/08/2017 1423     Results for [name], DESIREE FLEMING (MRN [id number]) as of 11/02/2018 11:42  Ref. Range 09/05/2018 09:52 09/21/2018 12:19 10/12/2018 10:55  Prostate Specific Ag, Serum Latest Ref Range: 0.0 - 4.0 ng/mL 745.0 (H) 1,172.0 (H) 2,148.0 (H)      Impression and Plan:  58 year old man with:   1.  Advanced prostate cancer with disease to the bone as well as hepatic metastasis that is currently castration-resistant.    He is currently receiving Jevtana chemotherapy and has completed 2 cycles.  His PSA continues to rise after 1 cycle of therapy  currently at 2148.  Risks and benefits of continuing this therapy versus switching to supportive care only was discussed today.  Given his overall decline and overall deterioration I have recommended discontinuation of treatment and proceeding with supportive and hospice care.  He is in agreement and would like to focus on quality of life rather than quantity.  He will discuss with his wife regarding hospice options in the future.   2. Androgen depravation: He is currently on Lupron which will be given every 4 months.  This will be discontinued given his decision to proceed with hospice.   3. Bone directed therapy: He is currently on calcium and vitamin D supplements.  Delton See has been deferred for the time being.  4.  IV access: Port-A-Cath will be de-accessed and remain in place for the time being.  5.  Growth factor support: He is high risk for neutropenic fever and sepsis.  No longer needed after discontinuation of chemotherapy.  6.  Pain: Refill for hydrocodone will be available to him.  7.  Goals of care: His disease is incurable and any therapy is palliative.  His performance status is declining and the benefit of chemotherapy  remains marginal at this time.  His overall life expectancy is less than 6 months and he would be hospice eligible.  His advanced directives were reviewed today and he desires not to have intubation or artificial feeding or cardiac resuscitation.  A copy of his living will included in the chart.  8. Follow-up: Will be as needed at this time and will rely on hospice for any updates for him.   25  minutes was spent with the patient face-to-face today.  More than 50% of time was dedicated to reviewing his disease status, treatment options, complications related to therapy as well as prognosis and advanced directives.   Zola Button, MD 5/8/202011:46 AM

## 2018-11-02 NOTE — Patient Instructions (Signed)

## 2018-11-02 NOTE — Addendum Note (Signed)
Addended by: Tami Lin on: 11/02/2018 12:32 PM   Modules accepted: Orders

## 2018-11-03 LAB — PROSTATE-SPECIFIC AG, SERUM (LABCORP): Prostate Specific Ag, Serum: 3196 ng/mL — ABNORMAL HIGH (ref 0.0–4.0)

## 2018-11-05 ENCOUNTER — Telehealth: Payer: Self-pay

## 2018-11-05 ENCOUNTER — Ambulatory Visit: Payer: 59

## 2018-11-05 NOTE — Telephone Encounter (Signed)
-----   Message from [name], MD sent at 11/05/2018  8:30 AM EDT ----- Please let him know his PSA. We decided to stop chemo on Friday and PSA confirms that.

## 2018-11-05 NOTE — Telephone Encounter (Signed)
Called and spoke to patient's wife Vaughan Basta. She verbalized understanding and was appreciative for the phone call.

## 2018-11-23 ENCOUNTER — Ambulatory Visit: Payer: 59 | Admitting: Oncology

## 2018-11-23 ENCOUNTER — Ambulatory Visit: Payer: 59

## 2018-11-23 ENCOUNTER — Other Ambulatory Visit: Payer: 59

## 2018-11-26 DEATH — deceased

## 2018-12-06 ENCOUNTER — Other Ambulatory Visit: Payer: Self-pay | Admitting: Pharmacist

## 2019-05-23 IMAGING — NM NUCLEAR MEDICINE WHOLE BODY BONE SCINTIGRAPHY
2 series · 2 of 2 positions shown · non-contrast
Comparison: Whole-body bone scan, 09/06/2017 and 03/21/2017.

CLINICAL DATA: Metastatic prostate carcinoma.  PSA 745.

EXAM:
NUCLEAR MEDICINE WHOLE BODY BONE SCAN
TECHNIQUE: Whole body anterior and posterior images were obtained approximately
3 hours after intravenous injection of radiopharmaceutical.
RADIOPHARMACEUTICALS:  21.0 mCi [id number]-ZZm MDP IV

[Series 1: wbr_bone_40 whole body · 2.66mm/px · 1 of 1 slices shown (1 of 2)]
[im 1/1]
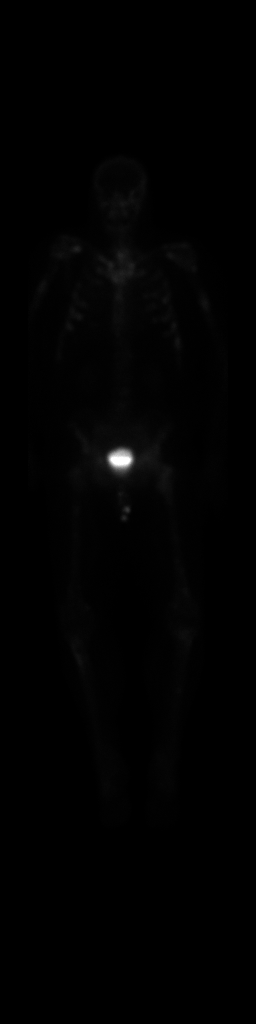

[Series 1: wbr_bone_40 whole body · 2.66mm/px · 1 of 1 slices shown (2 of 2)]
[im 1/1]
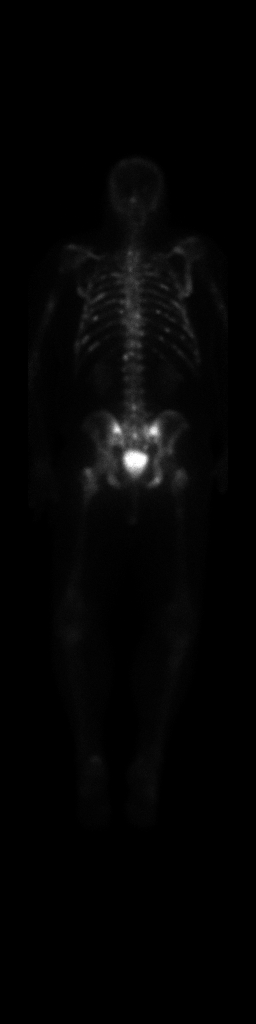

[2 of 2 positions shown; findings below may reference images not displayed]

FINDINGS: Since the prior whole body bone scan dated 09/06/2017, there has
been significant interval progression of bony metastatic disease.

There is a single focus of uptake along the central occipital bone.
Multiple new foci of uptake are noted along the thoracolumbar spine.
There are numerous new bilateral areas of abnormal rib uptake. Foci
of abnormal uptake are noted in the sternum, mildly increased from
the prior study. There are foci of abnormal uptake in both humeri.
Uptake in the sacral ala is similar. Uptake along the right ischium
is stable. There are multiple foci of new uptake involving both
femurs and tibias.

Renal uptake is symmetric.
IMPRESSION: 1. Marked progression of bony metastatic disease when compared to
the most recent prior bone scan as detailed above.

## 2022-01-17 ENCOUNTER — Other Ambulatory Visit: Payer: Self-pay

## 2023-06-30 ENCOUNTER — Other Ambulatory Visit: Payer: Self-pay
